# Patient Record
Sex: Male | Born: 1945
Health system: Southern US, Community
[De-identification: ages and names within clinical notes are randomized; demographics above are authoritative.]

## PROBLEM LIST (undated history)

## (undated) DIAGNOSIS — Z974 Presence of external hearing-aid: Secondary | ICD-10-CM

## (undated) DIAGNOSIS — F319 Bipolar disorder, unspecified: Secondary | ICD-10-CM

## (undated) HISTORY — DX: Presence of external hearing-aid: Z97.4

## (undated) HISTORY — PX: CERVICAL FUSION: SHX112

---

## 2009-05-26 ENCOUNTER — Ambulatory Visit: Payer: Self-pay | Admitting: Family Medicine

## 2009-05-26 DIAGNOSIS — N401 Enlarged prostate with lower urinary tract symptoms: Secondary | ICD-10-CM

## 2009-05-26 DIAGNOSIS — F319 Bipolar disorder, unspecified: Secondary | ICD-10-CM | POA: Insufficient documentation

## 2009-05-26 DIAGNOSIS — N529 Male erectile dysfunction, unspecified: Secondary | ICD-10-CM

## 2009-05-26 DIAGNOSIS — R351 Nocturia: Secondary | ICD-10-CM

## 2009-05-26 DIAGNOSIS — N4889 Other specified disorders of penis: Secondary | ICD-10-CM | POA: Insufficient documentation

## 2009-05-27 LAB — CONVERTED CEMR LAB
Albumin: 4.4 g/dL (ref 3.5–5.2)
Alkaline Phosphatase: 62 units/L (ref 39–117)
BUN: 20 mg/dL (ref 6–23)
CO2: 24 meq/L (ref 19–32)
Calcium: 10.1 mg/dL (ref 8.4–10.5)
Chloride: 106 meq/L (ref 96–112)
Glucose, Bld: 61 mg/dL — ABNORMAL LOW (ref 70–99)
Hemoglobin: 13.8 g/dL (ref 13.0–17.0)
PSA: 0.8 ng/mL (ref 0.10–4.00)
Potassium: 4.7 meq/L (ref 3.5–5.3)
RBC: 4.54 M/uL (ref 4.22–5.81)
Sex Hormone Binding: 24 nmol/L (ref 13–71)
Sodium: 139 meq/L (ref 135–145)
TSH: 1.973 microintl units/mL (ref 0.350–4.500)
Testosterone Free: 47.4 pg/mL (ref 47.0–244.0)
Testosterone-% Free: 2.3 % (ref 1.6–2.9)
Total Protein: 6.9 g/dL (ref 6.0–8.3)
WBC: 7.2 10*3/uL (ref 4.0–10.5)

## 2009-06-08 ENCOUNTER — Encounter: Payer: Self-pay | Admitting: Family Medicine

## 2009-06-09 LAB — CONVERTED CEMR LAB
Testosterone-% Free: 2.1 % (ref 1.6–2.9)
Testosterone: 232.46 ng/dL — ABNORMAL LOW (ref 350–890)

## 2009-06-10 ENCOUNTER — Ambulatory Visit: Payer: Self-pay | Admitting: Family Medicine

## 2009-06-24 ENCOUNTER — Ambulatory Visit: Payer: Self-pay | Admitting: Family Medicine

## 2009-06-24 ENCOUNTER — Telehealth: Payer: Self-pay | Admitting: Family Medicine

## 2009-06-24 DIAGNOSIS — R109 Unspecified abdominal pain: Secondary | ICD-10-CM | POA: Insufficient documentation

## 2009-06-24 LAB — CONVERTED CEMR LAB
Bilirubin Urine: NEGATIVE
Blood in Urine, dipstick: NEGATIVE
Glucose, Urine, Semiquant: NEGATIVE
Nitrite: NEGATIVE
WBC Urine, dipstick: NEGATIVE

## 2009-07-13 ENCOUNTER — Ambulatory Visit: Payer: Self-pay | Admitting: Family Medicine

## 2009-07-13 DIAGNOSIS — E291 Testicular hypofunction: Secondary | ICD-10-CM | POA: Insufficient documentation

## 2009-07-21 ENCOUNTER — Ambulatory Visit: Payer: Self-pay | Admitting: Family Medicine

## 2009-07-28 ENCOUNTER — Ambulatory Visit: Payer: Self-pay | Admitting: Family Medicine

## 2009-08-04 ENCOUNTER — Ambulatory Visit: Payer: Self-pay | Admitting: Family Medicine

## 2009-08-11 ENCOUNTER — Ambulatory Visit: Payer: Self-pay | Admitting: Family Medicine

## 2010-10-05 ENCOUNTER — Encounter: Payer: Self-pay | Admitting: Family Medicine

## 2010-10-27 NOTE — Letter (Signed)
Summary: [facility]  [facility]   Imported By: Lanelle Bal 10/20/2010 09:41:16  _____________________________________________________________________  External Attachment:    Type:   Image     Comment:   External Document

## 2010-12-15 ENCOUNTER — Encounter: Payer: Self-pay | Admitting: Family Medicine

## 2010-12-20 ENCOUNTER — Other Ambulatory Visit: Payer: Self-pay | Admitting: *Deleted

## 2010-12-20 ENCOUNTER — Encounter: Payer: Self-pay | Admitting: Family Medicine

## 2010-12-20 ENCOUNTER — Ambulatory Visit (INDEPENDENT_AMBULATORY_CARE_PROVIDER_SITE_OTHER): Payer: BC Managed Care – PPO | Admitting: Family Medicine

## 2010-12-20 DIAGNOSIS — Z13220 Encounter for screening for lipoid disorders: Secondary | ICD-10-CM

## 2010-12-20 DIAGNOSIS — E349 Endocrine disorder, unspecified: Secondary | ICD-10-CM

## 2010-12-20 DIAGNOSIS — E291 Testicular hypofunction: Secondary | ICD-10-CM

## 2010-12-20 MED ORDER — AMBULATORY NON FORMULARY MEDICATION: Status: DC

## 2010-12-20 NOTE — Assessment & Plan Note (Signed)
Discussed optins for testosterone replacement. He would like to try the gel. Will start with Androgel Recheck hormones levels in 6 weeks. He is over due to check a PSA. If no improvement with ED then f/u and complete questionnaire and can consider trial of ED meds.

## 2010-12-20 NOTE — Patient Instructions (Signed)
We will start Androgel. 1 pump to each upper arm daily (2 pumps).  Go online and see if can print a coupon card Lets recheck hormone levels in 6 week after start the med so we can adjust your dose if needed.

## 2010-12-20 NOTE — Progress Notes (Signed)
  Subjective:    Patient ID: Andres Stevens, male    DOB: 06/10/1946, 65 y.o.   MRN: [id number]  HPI He would like to restart testosterone.  Has been on shots previously but stopped sometime last year. He has noticed his libido is down and having some ED.  He also notes he has less muscular strength, dec mood and not sleeping as well. He has only over done injections for replacement therapy.      Review of Systems     Objective:   Physical Exam  Constitutional: He is oriented to person, place, and time. He appears well-developed and well-nourished.  HENT:  Head: Normocephalic and atraumatic.       Wearing bilat hearing aids  Eyes: Pupils are equal, round, and reactive to light.  Neck: Normal range of motion. Neck supple. No thyromegaly present.  Cardiovascular: Normal rate, regular rhythm and normal heart sounds.   Pulmonary/Chest: Effort normal and breath sounds normal.       No carotid bruits.   Lymphadenopathy:    He has no cervical adenopathy.  Neurological: He is alert and oriented to person, place, and time.  Psychiatric: He has a normal mood and affect. His behavior is normal.          Assessment & Plan:

## 2011-01-14 LAB — COMPLETE METABOLIC PANEL WITH GFR
ALT: 15 U/L (ref 0–53)
Albumin: 4.6 g/dL (ref 3.5–5.2)
Alkaline Phosphatase: 60 U/L (ref 39–117)
CO2: 25 mEq/L (ref 19–32)
Potassium: 4.4 mEq/L (ref 3.5–5.3)
Sodium: 140 mEq/L (ref 135–145)
Total Bilirubin: 0.7 mg/dL (ref 0.3–1.2)
Total Protein: 6.5 g/dL (ref 6.0–8.3)

## 2011-01-14 LAB — LIPID PANEL
HDL: 39 mg/dL — ABNORMAL LOW (ref >39)
LDL Cholesterol: 91 mg/dL (ref 0–99)
Total CHOL/HDL Ratio: 4.6 Ratio

## 2011-01-16 ENCOUNTER — Telehealth: Payer: Self-pay | Admitting: Family Medicine

## 2011-01-16 ENCOUNTER — Telehealth: Payer: Self-pay | Admitting: *Deleted

## 2011-01-16 NOTE — Telephone Encounter (Signed)
Pt called back for clarification of lab results.  Pt states he is not taking calcium.  He does take a multivitamin that may contain calcium.  Pt will purchase fish oil and return in one month for follow up triglycerides.  Please advise regarding calcium follow up as pt is not taking extra calcium.

## 2011-01-16 NOTE — Telephone Encounter (Signed)
Call pt: Calcium was borderline elevated. If taking a calcium supplement stop it and lets recheck calcium in one month. Chol looks good, except triglycerides are elevated. If he is not taking  Already taking fish oil, then I recommend taking a fish oil 1000 mg 3-4 times daily. We can recheck triglycerides s in one month as well.

## 2011-01-16 NOTE — Telephone Encounter (Signed)
If not taking extra calcium then that is OK. Will recheck level at f/u. Can continue his MVI for now.

## 2011-01-16 NOTE — Telephone Encounter (Signed)
Left message on pt's vm with dr,. reccomendations

## 2011-01-17 ENCOUNTER — Telehealth: Payer: Self-pay | Admitting: Family Medicine

## 2011-01-17 LAB — PSA: PSA: 0.7 ng/mL (ref <=4.00)

## 2011-01-17 NOTE — Telephone Encounter (Signed)
Call pt: prostate level was nl.

## 2011-01-17 NOTE — Telephone Encounter (Signed)
Advised pt OK to continue mulitvitamin, begin fish oil and we will recheck triglycerides and calcium in one month.  Pt to call a few days ahead for orders.

## 2011-01-18 NOTE — Telephone Encounter (Signed)
Left message on vm

## 2011-02-27 ENCOUNTER — Telehealth: Payer: Self-pay | Admitting: Family Medicine

## 2011-02-27 MED ORDER — AMBULATORY NON FORMULARY MEDICATION: Status: DC

## 2011-02-27 NOTE — Telephone Encounter (Signed)
Prescription sent. Injection testosterone removed from the med list.

## 2011-02-27 NOTE — Telephone Encounter (Signed)
Pt called and wants to restart treatment for low testosterone.  (Androgel pump).  Was seen last 12-20-10.  Said he and provider decided would start the androgel pump, and not do the injections anymore.  Has decided to start the gel.   Plan:  Routed message to Dr. Linford Arnold to write new order for the medication, and also pt instructed to repeat levels along with PSA level in 6 weeks.  Pt voiced understanding. Routed to Dr. Marlyne Beards, LPN [facility]

## 2011-05-03 ENCOUNTER — Telehealth: Payer: Self-pay | Admitting: *Deleted

## 2011-05-03 DIAGNOSIS — E291 Testicular hypofunction: Secondary | ICD-10-CM

## 2011-05-03 DIAGNOSIS — N400 Enlarged prostate without lower urinary tract symptoms: Secondary | ICD-10-CM

## 2011-05-03 NOTE — Telephone Encounter (Signed)
Pt called for lab order

## 2011-05-05 ENCOUNTER — Telehealth: Payer: Self-pay | Admitting: Family Medicine

## 2011-05-05 NOTE — Telephone Encounter (Signed)
Left message on vm

## 2011-05-05 NOTE — Telephone Encounter (Signed)
Call pt: PSA is normal.

## 2011-05-08 ENCOUNTER — Ambulatory Visit (INDEPENDENT_AMBULATORY_CARE_PROVIDER_SITE_OTHER): Payer: BC Managed Care – PPO | Admitting: Family Medicine

## 2011-05-08 ENCOUNTER — Encounter: Payer: Self-pay | Admitting: Family Medicine

## 2011-05-08 ENCOUNTER — Other Ambulatory Visit: Payer: Self-pay | Admitting: Family Medicine

## 2011-05-08 DIAGNOSIS — E291 Testicular hypofunction: Secondary | ICD-10-CM

## 2011-05-08 DIAGNOSIS — N529 Male erectile dysfunction, unspecified: Secondary | ICD-10-CM

## 2011-05-08 NOTE — Progress Notes (Signed)
  Subjective:    Patient ID: Andres Stevens, male    DOB: [birth year], 65 y.o.   MRN: [id number]  HPI On the 2nd bottle of androgel and did miss 3-4 days.  Doesn't feel that much different. He has not noticed any changes in his energy or mood,or muscle strength. He has not noticed any improvement with his erectile dysfunction. In fact he has been more fatigued over the last 3 weeks. He wonders if this could be from the AndroGel. Otherwise he's tolerating the medication well and has not had any side effects. He did do the lab last week to have his blood work checked. His PSA was normal, but unfortunately we have not received the results of the testosterone level.   Review of Systems     Objective:   Physical Exam  Constitutional: He appears well-developed and well-nourished.  Psychiatric: He has a normal mood and affect. His behavior is normal.          Assessment & Plan:  Hypergonadism-I really need to get his blood level to see if he may be subtherapeutic. If he is we will adjust his dose and then recheck his level in 2 months. If he is at a therapeutic level and he is not noticing any symptom improvement and I recommend discontinuing medication. I also explained to him that even if he does start to notice improvement other areas of his life such as energy and muscle strength he may not notice improvement in his erectile dysfunction as oftentimes the cause for that is multifactorial.  Erectile dysfunction-with a long discussion about different options including how different medications work. We also discussed daily dosing medication versus as needed dosing. I did give him samples of Levitra 10 mg, Viagra 50 mg, Cialis 20 mg. He can try different medications and let me know if he feels they're helping him walk well.  20 minutes spent face-to-face discussing options for her testosterone and erectile dysfunction.

## 2011-05-08 NOTE — Patient Instructions (Signed)
We will call you about your testosterone level.

## 2011-05-09 ENCOUNTER — Other Ambulatory Visit: Payer: Self-pay | Admitting: Family Medicine

## 2011-05-09 LAB — TESTOSTERONE, FREE, TOTAL, SHBG: Testosterone, Free: 34.8 pg/mL — ABNORMAL LOW (ref 47.0–244.0)

## 2011-05-09 NOTE — Telephone Encounter (Signed)
Call pt: Increase androgel to 2 pumps on each upper arm once a day in the AM  I can send over new rx. Then recheck levels in 8 weeks.

## 2011-05-10 MED ORDER — AMBULATORY NON FORMULARY MEDICATION: Status: DC

## 2011-05-10 NOTE — Telephone Encounter (Signed)
Left message on pt.'s vm.

## 2011-06-19 ENCOUNTER — Telehealth: Payer: Self-pay | Admitting: Family Medicine

## 2011-06-19 ENCOUNTER — Other Ambulatory Visit: Payer: Self-pay | Admitting: Family Medicine

## 2011-06-19 MED ORDER — TADALAFIL 20 MG PO TABS: 20.0000 mg | ORAL_TABLET | Freq: Every day | ORAL | Status: DC | PRN

## 2011-06-19 NOTE — Telephone Encounter (Signed)
Pt called for 30 day free trial of cialis 20 mg.  However the 30 day free trial is for 2.5 or 5 mg and those doses do not work immediately.  LMOM for the pt to return the call to the triage nurse to let her know what he would like to do. Plan:  Pending patient call back. Jarvis Newcomer, LPN [facility]'

## 2011-06-19 NOTE — Telephone Encounter (Signed)
Pt calling for Rx of his cialis for a 30 day free trial if hard script taken to  Pharm with his coupon.  Uses cialis 20 mg. Plan:  Printed script for 30 day worth for cialis 20 mg.  Jarvis Newcomer, LPN [facility]

## 2011-06-20 ENCOUNTER — Other Ambulatory Visit: Payer: Self-pay | Admitting: Family Medicine

## 2011-06-20 MED ORDER — TADALAFIL 5 MG PO TABS: 5.0000 mg | ORAL_TABLET | Freq: Every day | ORAL | Status: DC | PRN

## 2011-06-20 NOTE — Telephone Encounter (Signed)
This encounter has been taken care of .  Rx for  cialis 5 mg was sent to pt pharm.  Pt aware. Jarvis Newcomer, LPN [facility];

## 2011-06-20 NOTE — Telephone Encounter (Signed)
Completed.

## 2011-06-20 NOTE — Telephone Encounter (Signed)
Pt has a 30 day free trial coupon for cialis .  Wants to do the 5 mg for daily use.

## 2011-09-12 ENCOUNTER — Telehealth: Payer: Self-pay | Admitting: *Deleted

## 2011-09-12 MED ORDER — TADALAFIL 5 MG PO TABS: 5.0000 mg | ORAL_TABLET | Freq: Every day | ORAL | Status: DC | PRN

## 2011-11-14 ENCOUNTER — Encounter: Payer: Self-pay | Admitting: Physician Assistant

## 2011-11-14 ENCOUNTER — Ambulatory Visit (INDEPENDENT_AMBULATORY_CARE_PROVIDER_SITE_OTHER): Payer: BC Managed Care – PPO | Admitting: Physician Assistant

## 2011-11-14 VITALS — BP 116/62 | HR 64 | Wt 170.0 lb

## 2011-11-14 DIAGNOSIS — Z23 Encounter for immunization: Secondary | ICD-10-CM

## 2011-11-14 DIAGNOSIS — N401 Enlarged prostate with lower urinary tract symptoms: Secondary | ICD-10-CM

## 2011-11-14 DIAGNOSIS — Z0000 Encounter for general adult medical examination without abnormal findings: Secondary | ICD-10-CM

## 2011-11-14 LAB — POCT URINALYSIS DIPSTICK
Bilirubin, UA: NEGATIVE
Blood, UA: NEGATIVE
Leukocytes, UA: NEGATIVE
Nitrite, UA: NEGATIVE
Urobilinogen, UA: 0.2
pH, UA: 6.5

## 2011-11-14 MED ORDER — TERAZOSIN HCL 1 MG PO CAPS: 1.0000 mg | ORAL_CAPSULE | Freq: Every day | ORAL | Status: DC

## 2011-11-14 NOTE — Patient Instructions (Addendum)
Will send letter to psychiatrist to encourage increase of prozac. Will refer to neurology. Can consider melatonin to help with sleep. Will send to pharmacy the terazosin for enlarge prostate.   Gave Zostavax in office.

## 2011-11-14 NOTE — Progress Notes (Signed)
  Subjective:    Patient ID: Andres Stevens, male    DOB: [birth year], 66 y.o.   MRN: [id number]  HPI Patient presents to the clinic for a CPE.   Patient and family has noticed some short term memory loss. He has been evaluated at [facility] 2 times. They states that he has tested positve for memory loss and needs to see neurologist. He has had a cousin that had Lewy body demitia and recently passed away. His family has had memory loss but noon has been diagnosed with alzhiemer's . Patient today states that he struggle remembers names and things that people tell him to do.   His depression had been controlled for many months but recently patient has felt really down. He recently lost his job because he was too slow at getting work done. He has had more problems staying asleep lately because he is thinking about so much. He denies any thoughts of suicide. Patient wants to have some of his medications changed. PA Andres Stevens is seeing him for pysch.  Last colonoscopy was 5 years ago.   He has over the last 6 months noticed problems with urinating much more frequently and weak stream. Will have paitent fill out quesionarrie.     Review of Systems    Objective:   Physical Exam  Constitutional: He is oriented to person, place, and time. He appears well-developed and well-nourished.  HENT:  Head: Normocephalic and atraumatic.  Right Ear: External ear normal.  Left Ear: External ear normal.  Nose: Nose normal.  Mouth/Throat: Oropharynx is clear and moist.       TM's normal bilaterally. Hearing loss noted in both ears. Patient is not wearing his hearing aids today.  Eyes: Conjunctivae are normal. Pupils are equal, round, and reactive to light.  Neck: Normal range of motion. Neck supple.  Cardiovascular: Normal rate, regular rhythm, normal heart sounds and intact distal pulses.   Pulmonary/Chest: Effort normal and breath sounds normal.  Abdominal: Soft. Bowel sounds are  normal.  Genitourinary: Rectum normal. Guaiac negative stool.       Smooth, enlarged prostate.  Musculoskeletal: Normal range of motion.       Strength 5/5.  Neurological: He is alert and oriented to person, place, and time. He has normal reflexes.  Skin: Skin is warm and dry.  Psychiatric: He has a normal mood and affect. His behavior is normal.          Assessment & Plan:  CPE- Zostavax given. Lipid panel and CMP ordered. Will call with results. Hemmocult negative. Continue healthy diet and regular exercise.  BPH- AUA-23. Started on Terazosin tapering up. Order PSA.   Memory Loss-Was able to remember 3 obj at begnining of interview and at the end.Patient has been tested twice in [place] at a memory clinic. They suggested a referal to Neurology. Office will call with appointment. Patient was told to bring in results of memory testing from clinic in [place].   Depression-PHQ-9: 14.We discussed his prozac being increased and patient did agree. Andres Stevens [facility] manages his medications. I will fax a letter suggesting an increase.  Will send to: [facility] [address] [place] [address] [place] [address] Melatonin OTC was suggested to help with sleep.

## 2011-11-16 LAB — LIPID PANEL
Cholesterol: 198 mg/dL (ref 0–200)
Total CHOL/HDL Ratio: 5 Ratio
Triglycerides: 248 mg/dL — ABNORMAL HIGH (ref <150)
VLDL: 50 mg/dL — ABNORMAL HIGH (ref 0–40)

## 2011-11-16 LAB — COMPREHENSIVE METABOLIC PANEL
AST: 18 U/L (ref 0–37)
Alkaline Phosphatase: 68 U/L (ref 39–117)
BUN: 22 mg/dL (ref 6–23)
Calcium: 10.3 mg/dL (ref 8.4–10.5)
Chloride: 106 mEq/L (ref 96–112)
Creat: 1.18 mg/dL (ref 0.50–1.35)
Glucose, Bld: 103 mg/dL — ABNORMAL HIGH (ref 70–99)

## 2011-11-16 LAB — PSA, TOTAL AND FREE
PSA, Free Pct: 31 % (ref >25)
PSA, Free: 0.19 ng/mL

## 2011-11-20 ENCOUNTER — Telehealth: Payer: Self-pay | Admitting: *Deleted

## 2011-11-20 NOTE — Telephone Encounter (Signed)
Pt states that he will try medication for his cholesterol. He also would like for you to know that he is waiting for them to type up his memory test result and that he should have them to you by mid-week.

## 2011-11-20 NOTE — Telephone Encounter (Signed)
Acknowledge I have read information.

## 2011-11-23 ENCOUNTER — Telehealth: Payer: Self-pay | Admitting: *Deleted

## 2011-11-23 MED ORDER — FENOFIBRATE 145 MG PO TABS: 145.0000 mg | ORAL_TABLET | Freq: Every day | ORAL | Status: DC

## 2011-11-23 NOTE — Telephone Encounter (Signed)
rx sent. Recheck labs in 8 weeks.

## 2011-11-23 NOTE — Telephone Encounter (Signed)
LM on VM.

## 2011-11-23 NOTE — Telephone Encounter (Signed)
I sent message to Andres Stevens stating pt is willing to try cholesterol med but it was never sent to pharmacy. Please advise what medication needs to be sent for his cholesterol.

## 2011-12-25 ENCOUNTER — Other Ambulatory Visit: Payer: Self-pay | Admitting: Physician Assistant

## 2012-02-13 ENCOUNTER — Encounter: Payer: Self-pay | Admitting: Family Medicine

## 2012-02-13 ENCOUNTER — Ambulatory Visit (INDEPENDENT_AMBULATORY_CARE_PROVIDER_SITE_OTHER): Payer: BC Managed Care – PPO | Admitting: Family Medicine

## 2012-02-13 VITALS — BP 114/68 | HR 65 | Ht 68.5 in | Wt 169.0 lb

## 2012-02-13 DIAGNOSIS — M533 Sacrococcygeal disorders, not elsewhere classified: Secondary | ICD-10-CM

## 2012-02-13 DIAGNOSIS — M549 Dorsalgia, unspecified: Secondary | ICD-10-CM

## 2012-02-13 MED ORDER — ORPHENADRINE CITRATE ER 100 MG PO TB12: 100.0000 mg | ORAL_TABLET | Freq: Two times a day (BID) | ORAL | Status: AC

## 2012-02-13 MED ORDER — KETOROLAC TROMETHAMINE 60 MG/2ML IM SOLN
60.0000 mg | Freq: Once | INTRAMUSCULAR | Status: AC
  Administered 2012-02-13: 60 mg via INTRAMUSCULAR

## 2012-02-13 MED ORDER — HYDROCODONE-ACETAMINOPHEN 5-325 MG PO TABS: 1.0000 | ORAL_TABLET | Freq: Four times a day (QID) | ORAL | Status: AC | PRN

## 2012-02-13 MED ORDER — MELOXICAM 15 MG PO TABS: 15.0000 mg | ORAL_TABLET | Freq: Every day | ORAL | Status: AC

## 2012-02-13 MED ORDER — PREDNISONE (PAK) 10 MG PO TABS: 10.0000 mg | ORAL_TABLET | Freq: Every day | ORAL | Status: AC

## 2012-02-13 NOTE — Patient Instructions (Signed)
Back Pain, Adult Low back pain is very common. About 1 in 5 people have back pain.The cause of low back pain is rarely dangerous. The pain often gets better over time.About half of people with a sudden onset of back pain feel better in just 2 weeks. About 8 in 10 people feel better by 6 weeks.  CAUSES Some common causes of back pain include:  Strain of the muscles or ligaments supporting the spine.   Wear and tear (degeneration) of the spinal discs.   Arthritis.   Direct injury to the back.  DIAGNOSIS Most of the time, the direct cause of low back pain is not known.However, back pain can be treated effectively even when the exact cause of the pain is unknown.Answering your caregiver's questions about your overall health and symptoms is one of the most accurate ways to make sure the cause of your pain is not dangerous. If your caregiver needs more information, he or she may order lab work or imaging tests (X-rays or MRIs).However, even if imaging tests show changes in your back, this usually does not require surgery. HOME CARE INSTRUCTIONS For many people, back pain returns.Since low back pain is rarely dangerous, it is often a condition that people can learn to manageon their own.   Remain active. It is stressful on the back to sit or stand in one place. Do not sit, drive, or stand in one place for more than 30 minutes at a time. Take short walks on level surfaces as soon as pain allows.Try to increase the length of time you walk each day.   Do not stay in bed.Resting more than 1 or 2 days can delay your recovery.   Do not avoid exercise or work.Your body is made to move.It is not dangerous to be active, even though your back may hurt.Your back will likely heal faster if you return to being active before your pain is gone.   Pay attention to your body when you bend and lift. Many people have less discomfortwhen lifting if they bend their knees, keep the load close to their  bodies,and avoid twisting. Often, the most comfortable positions are those that put less stress on your recovering back.   Find a comfortable position to sleep. Use a firm mattress and lie on your side with your knees slightly bent. If you lie on your back, put a pillow under your knees.   Only take over-the-counter or prescription medicines as directed by your caregiver. Over-the-counter medicines to reduce pain and inflammation are often the most helpful.Your caregiver may prescribe muscle relaxant drugs.These medicines help dull your pain so you can more quickly return to your normal activities and healthy exercise.   Put ice on the injured area.   Put ice in a plastic bag.   Place a towel between your skin and the bag.   Leave the ice on for 15 to 20 minutes, 3 to 4 times a day for the first 2 to 3 days. After that, ice and heat may be alternated to reduce pain and spasms.   Ask your caregiver about trying back exercises and gentle massage. This may be of some benefit.   Avoid feeling anxious or stressed.Stress increases muscle tension and can worsen back pain.It is important to recognize when you are anxious or stressed and learn ways to manage it.Exercise is a great option.  SEEK MEDICAL CARE IF:  You have pain that is not relieved with rest or medicine.   You have   pain that does not improve in 1 week.   You have new symptoms.   You are generally not feeling well.  SEEK IMMEDIATE MEDICAL CARE IF:   You have pain that radiates from your back into your legs.   You develop new bowel or bladder control problems.   You have unusual weakness or numbness in your arms or legs.   You develop nausea or vomiting.   You develop abdominal pain.   You feel faint.  Document Released: 09/11/2005 Document Revised: 08/31/2011 Document Reviewed: 01/30/2011 [employer] Patient Information 2012 [employer]. 

## 2012-02-13 NOTE — Progress Notes (Signed)
  Subjective:    Patient ID: Andres Stevens, male    DOB: June 28, 1946, 66 y.o.   MRN: [id number]  Back Pain This is a chronic (SINCE 92 W/ACUTE EXACERBATIONS) problem. The current episode started more than 1 month ago (DOING YARD WORK AND THEN 3 WEEKS AGO STARTED DOING YARD WORK GOT WORSE). The problem occurs daily. The problem has been gradually worsening since onset. The pain is present in the sacro-iliac and lumbar spine. The quality of the pain is described as stabbing. The pain does not radiate. The pain is at a severity of 9/10. The pain is severe. The pain is worse during the night. The symptoms are aggravated by position, bending and twisting. Stiffness is present at night. Pertinent negatives include no abdominal pain, bladder incontinence, bowel incontinence, chest pain, dysuria, fever, headaches, leg pain, numbness, paresis, paresthesias, pelvic pain, perianal numbness, tingling, weakness or weight loss. Risk factors: NONE. He has tried NSAIDs for the symptoms. The treatment provided mild relief.      Review of Systems  Constitutional: Negative for fever and weight loss.  Cardiovascular: Negative for chest pain.  Gastrointestinal: Negative for abdominal pain and bowel incontinence.  Genitourinary: Negative for bladder incontinence, dysuria and pelvic pain.  Musculoskeletal: Positive for back pain.  Neurological: Negative for tingling, weakness, numbness, headaches and paresthesias.  All other systems reviewed and are negative.      BP 114/68  Pulse 65  Ht 5' 8.5" (1.74 m)  Wt 169 lb (76.658 kg)  BMI 25.32 kg/m2  SpO2 99% Objective:   Physical Exam  Constitutional: He is oriented to person, place, and time. He appears well-developed and well-nourished. No distress.  Musculoskeletal: Normal range of motion. He exhibits no edema and no tenderness.       TENDERNESS IN THE ILIAC SACRAL JOINT AREA  Neurological: He is oriented to person, place, and time. He displays normal  reflexes. He exhibits normal muscle tone. Coordination normal.  Skin: Skin is warm and dry.  Psychiatric: He has a normal mood and affect.      Assessment & Plan:  #! Back pain /iliac sacral irritation. First Toradol here in the office and then:norflex 100 mg 2 x a day,   mobic 15 mg daily, and hydrocodone mainly at night for pain and patient will be placed on steroid taper dose.  Return in 1 ,month if needed.for folopw up

## 2012-03-19 ENCOUNTER — Encounter: Payer: Self-pay | Admitting: Family Medicine

## 2012-03-19 ENCOUNTER — Ambulatory Visit (INDEPENDENT_AMBULATORY_CARE_PROVIDER_SITE_OTHER): Payer: BC Managed Care – PPO | Admitting: Family Medicine

## 2012-03-19 VITALS — BP 105/64 | HR 62 | Ht 68.5 in | Wt 166.0 lb

## 2012-03-19 DIAGNOSIS — Z9181 History of falling: Secondary | ICD-10-CM

## 2012-03-19 DIAGNOSIS — R5381 Other malaise: Secondary | ICD-10-CM

## 2012-03-19 DIAGNOSIS — R5383 Other fatigue: Secondary | ICD-10-CM

## 2012-03-19 DIAGNOSIS — R7309 Other abnormal glucose: Secondary | ICD-10-CM

## 2012-03-19 DIAGNOSIS — F329 Major depressive disorder, single episode, unspecified: Secondary | ICD-10-CM

## 2012-03-19 DIAGNOSIS — F319 Bipolar disorder, unspecified: Secondary | ICD-10-CM

## 2012-03-19 DIAGNOSIS — F3289 Other specified depressive episodes: Secondary | ICD-10-CM

## 2012-03-19 DIAGNOSIS — R739 Hyperglycemia, unspecified: Secondary | ICD-10-CM

## 2012-03-19 NOTE — Progress Notes (Signed)
Subjective:    Patient ID: Andres Stevens, male    DOB: 05/11/1946, 66 y.o.   MRN: [id number]  HPI  Patient is here because of increased fatigue and concern that he be getting depressed. Since he has been on the prednisone taper for his back he has noticed some fatigue and not functioning at what he feels is his full capacity. Patient unfortunately is somewhat vague about when to start it and how long this has been going on. He does report last month at his pre-reunion gathering participating in more alcohol consumption than usual and not feeling well for a couple days including the actual day of his reunion. He also has informed me that she definitely has diagnosis of bipolar disease and that his psychiatrist has recently changed him from Prozac to Lexapro. Interesting he went from 40 of Prozac to only 10 mg of Lexapro.  He is still taking the Mobic for his back pain and the Norflex twice a day for muscle spasm. He is taking his TriCor but states that he has stopped taking his Cialis and he is still taking his Hytrin which is a medication he has used for a long time.  Review of Systems  Constitutional: Positive for fatigue.  Musculoskeletal: Positive for gait problem.  Psychiatric/Behavioral: Positive for decreased concentration.      BP 105/64  Pulse 62  Ht 5' 8.5" (1.74 m)  Wt 166 lb (75.297 kg)  BMI 24.87 kg/m2  SpO2 98% Objective:   Physical Exam  Constitutional: He is oriented to person, place, and time. He appears well-developed and well-nourished.  HENT:  Head: Normocephalic.  Cardiovascular: Normal rate.   Pulmonary/Chest: Effort normal and breath sounds normal.  Neurological: He is alert and oriented to person, place, and time. He has normal reflexes.  Skin: Skin is warm.  Psychiatric: He has a normal mood and affect.   Fall assessment was a 1 age only Depression score was only a 6 on the PQ9.    Results for orders placed in visit on 11/14/11  LIPID PANEL   Component Value Range   Cholesterol 198  0 - 200 mg/dL   Triglycerides 409 (*) <150 mg/dL   HDL 40  >81 mg/dL   Total CHOL/HDL Ratio 5.0     VLDL 50 (*) 0 - 40 mg/dL   LDL Cholesterol 191 (*) 0 - 99 mg/dL  COMPREHENSIVE METABOLIC PANEL      Component Value Range   Sodium 140  135 - 145 mEq/L   Potassium 4.5  3.5 - 5.3 mEq/L   Chloride 106  96 - 112 mEq/L   CO2 28  19 - 32 mEq/L   Glucose, Bld 103 (*) 70 - 99 mg/dL   BUN 22  6 - 23 mg/dL   Creat 4.78  2.95 - 6.21 mg/dL   Total Bilirubin 0.7  0.3 - 1.2 mg/dL   Alkaline Phosphatase 68  39 - 117 U/L   AST 18  0 - 37 U/L   ALT 18  0 - 53 U/L   Total Protein 6.5  6.0 - 8.3 g/dL   Albumin 4.4  3.5 - 5.2 g/dL   Calcium 30.8  8.4 - 65.7 mg/dL  PSA, TOTAL AND FREE      Component Value Range   PSA 0.62  <=4.00 ng/mL   PSA, Free 0.19     PSA, Free Pct 31  >25 %  POCT URINALYSIS DIPSTICK      Component Value Range  Color, UA yellow     Clarity, UA clear     Glucose, UA neg     Bilirubin, UA neg     Ketones, UA neg     Spec Grav, UA 1.020     Blood, UA neg     pH, UA 6.5     Protein, UA neg     Urobilinogen, UA 0.2     Nitrite, UA neg     Leukocytes, UA Negative     Assessment & Plan:  #1 fatigue/increased depression? Since he has a psychiatrist I do not want to personally change his medication. I did suggest to him that he may want to check with his psychiatrist if he or his wife feels that his depression has not improved. He may need an increase of Lexapro to 20 mg. Reassure him that the short course of prednisone should not cause persistent fatigue and tiredness. Especially this late date. #2 back pain. I suggest that he only take the Norflex and Mobic on a when necessary basis and that he only takes the Norflex and Mobic at night just in case the Norflex causes some sedation. #3 elevated blood sugar. Will also check an A1c since his blood sugar was elevated in February as well as a TSH ,CBC and a testosterone level since there  has been a history of sexual dysfunction as well. #4 since there was a question of gait disturbance and depression both the fall and depression assessments were done with no significant findings.

## 2012-03-19 NOTE — Patient Instructions (Signed)

## 2012-03-20 LAB — TESTOSTERONE, FREE, TOTAL, SHBG: Testosterone: 133.07 ng/dL — ABNORMAL LOW (ref 300–890)

## 2012-03-20 LAB — TSH: TSH: 1.903 u[IU]/mL (ref 0.350–4.500)

## 2012-03-20 LAB — CBC WITH DIFFERENTIAL/PLATELET
Basophils Absolute: 0 10*3/uL (ref 0.0–0.1)
Eosinophils Absolute: 0.1 10*3/uL (ref 0.0–0.7)
Lymphs Abs: 0.5 10*3/uL — ABNORMAL LOW (ref 0.7–4.0)
MCH: 30 pg (ref 26.0–34.0)
Neutrophils Relative %: 82 % — ABNORMAL HIGH (ref 43–77)
Platelets: 132 10*3/uL — ABNORMAL LOW (ref 150–400)
RBC: 4.23 MIL/uL (ref 4.22–5.81)
WBC: 4.4 10*3/uL (ref 4.0–10.5)

## 2012-03-26 ENCOUNTER — Encounter: Payer: Self-pay | Admitting: Family Medicine

## 2012-04-04 ENCOUNTER — Ambulatory Visit (INDEPENDENT_AMBULATORY_CARE_PROVIDER_SITE_OTHER): Payer: BC Managed Care – PPO | Admitting: Family Medicine

## 2012-04-04 ENCOUNTER — Encounter: Payer: Self-pay | Admitting: Family Medicine

## 2012-04-04 VITALS — BP 122/73 | HR 76 | Ht 68.5 in | Wt 165.0 lb

## 2012-04-04 DIAGNOSIS — D649 Anemia, unspecified: Secondary | ICD-10-CM

## 2012-04-04 DIAGNOSIS — R5383 Other fatigue: Secondary | ICD-10-CM

## 2012-04-04 DIAGNOSIS — R5381 Other malaise: Secondary | ICD-10-CM

## 2012-04-04 DIAGNOSIS — E291 Testicular hypofunction: Secondary | ICD-10-CM

## 2012-04-04 NOTE — Progress Notes (Signed)
  Subjective:    Patient ID: Andres Stevens, male    DOB: [birth year], 66 y.o.   MRN: [id number]  HPI Here to f/u abnormal labwork.  Was on androgel for a peroid of time. No hx of prostate Ca but no enlargement.  He did see his psychatrist. They didn't adjust his meds.  They kept him on Lexapro 10 mg. On his blood work his testosterone was low. We reviewed this and discuss treatment options. We also discussed that his hemoglobin had drifted slowly under 13. It is very mild but does need further workup. He does complain of fatigue today.   Review of Systems     Objective:   Physical Exam  Constitutional: He is oriented to person, place, and time. He appears well-developed and well-nourished.  HENT:  Head: Normocephalic and atraumatic.  Cardiovascular: Normal rate, regular rhythm and normal heart sounds.   Pulmonary/Chest: Effort normal and breath sounds normal.  Neurological: He is alert and oriented to person, place, and time.  Skin: Skin is warm and dry.  Psychiatric: He has a normal mood and affect. His behavior is normal.          Assessment & Plan:  Hypogonadism - discussed test replacement options. I showed him the demonstration products for Fortesta, AndroGel, and AXIRON. He can certainly think about his options if he would like to move forward we certainly can. Explained to him that we restarted testosterone therapy we will need to recheck his labs including his prostate liver enzymes in one month.  Anemia - Will check ferritin, vit B12 and folate. We'll call with lab results. He denies any blood in the urine or stool.  His colonoscopy was in 2007 and is technically up-to-date.  Fatigue- says feels better since stopped the norflex. He does have some mild anemia but I'm not sure that is significant enough to contribute to his fatigue.  Chronic back pain-stable.

## 2012-04-04 NOTE — Patient Instructions (Addendum)
Think about the testosterone replacment.  We will call you with your lab results. If you don't here from [place] in about a week then please give [place] a call at [phone].

## 2012-04-05 LAB — FERRITIN: Ferritin: 197 ng/mL (ref 22–322)

## 2012-04-05 LAB — FOLATE: Folate: >20 ng/mL

## 2012-05-13 ENCOUNTER — Encounter: Payer: Self-pay | Admitting: Physician Assistant

## 2012-05-13 ENCOUNTER — Ambulatory Visit (INDEPENDENT_AMBULATORY_CARE_PROVIDER_SITE_OTHER): Payer: BC Managed Care – PPO | Admitting: Physician Assistant

## 2012-05-13 VITALS — BP 121/66 | HR 67 | Ht 68.5 in | Wt 167.0 lb

## 2012-05-13 DIAGNOSIS — E291 Testicular hypofunction: Secondary | ICD-10-CM

## 2012-05-13 DIAGNOSIS — F0150 Vascular dementia without behavioral disturbance: Secondary | ICD-10-CM | POA: Insufficient documentation

## 2012-05-13 DIAGNOSIS — F329 Major depressive disorder, single episode, unspecified: Secondary | ICD-10-CM

## 2012-05-13 DIAGNOSIS — Z79899 Other long term (current) drug therapy: Secondary | ICD-10-CM

## 2012-05-13 DIAGNOSIS — F03B4 Unspecified dementia, moderate, with anxiety: Secondary | ICD-10-CM | POA: Insufficient documentation

## 2012-05-13 DIAGNOSIS — E781 Pure hyperglyceridemia: Secondary | ICD-10-CM

## 2012-05-13 DIAGNOSIS — R413 Other amnesia: Secondary | ICD-10-CM

## 2012-05-13 DIAGNOSIS — N529 Male erectile dysfunction, unspecified: Secondary | ICD-10-CM

## 2012-05-13 MED ORDER — VARDENAFIL HCL 10 MG PO TABS: 10.0000 mg | ORAL_TABLET | Freq: Every day | ORAL | Status: DC | PRN

## 2012-05-13 NOTE — Patient Instructions (Addendum)
Stop taking Terazosin. If urinary symptoms come back then we will need to restart but taking it daily. Will follow up in 3 months and retake AUA test.  Stop Mobic(meloxicam) for back pain and see if pain comes back.  Consider taking when going to be active lack mowing.   After fasting for 8 hours come and get labs drawn to recheck triglycerides. Remember to take Tricor daily.   Will call with labs and schedule testosterone shots accordingly.   Follow up in 3 months to go over how you feel after testosterone shots and stoping Terazosin.   Gave Levitra to take as needed for sexual intercourse.

## 2012-05-13 NOTE — Progress Notes (Signed)
Subjective:    Patient ID: Andres Stevens, male    DOB: 1946-09-18, 66 y.o.   MRN: [id number]  HPI Patient presents to the clinic for 6 followup. He reports that he does feel some better with his depression. His film Lexapro 10 mg along with lithium 150 mg 3 times a day. PA-C Andres Stevens manages his psych meds. He has started exercising some and when he does he does so feel better. His last visit with Dr. Glade Stevens did discuss testosterone replacement since his testosterone was low. At that time he did not want to start with testosterone replacement because he felt like he had tried AndroGel the past and it did nothing for him. Today he thinks he would like to try testosterone shots.  Patient really wants to go on Cialis as needed for erectile dysfunction. He reports he has tried and failed Viagra but has not tried Orthoptist. He denies any relationship problems with his wife.  Hypertriglyceridemia-he often forgets to take his TriCor. She has tried to make dietary changes and exercise more frequently.  Despite AUA symptoms being 23 at last visit with me in February patient for reports that they have improved. He no longer wakes up multiple times at night. He does still feel like his strength is weak. He has been taken terazosin but it has not been regularly.  Patient is still having problems with memory. He has been evaluated by neurologist and MRI was done and was normal. His B12 levels have been checked and were normal. He does have a family history of Alzheimer's.  He also brings in a all of his medications that he is taking along with their side effects. He is concerned that some of his medications may be interacting with each other or causing side effects.    Surgical history and family history remains unchanged. Patient still continues to smoke everyday.  Review of Systems  Constitutional: Positive for fatigue. Negative for activity change and appetite change.  Cardiovascular: Negative for  chest pain and palpitations.  Genitourinary: Positive for difficulty urinating. Negative for dysuria and urgency.  Musculoskeletal: Negative for myalgias and back pain.  Psychiatric/Behavioral: Positive for dysphoric mood. The patient is not nervous/anxious.        Objective:   Physical Exam  Constitutional: He is oriented to person, place, and time. He appears well-developed and well-nourished.  HENT:  Head: Normocephalic and atraumatic.  Cardiovascular: Normal rate, regular rhythm and normal heart sounds.   Pulmonary/Chest: Effort normal and breath sounds normal. He has no wheezes.  Neurological: He is alert and oriented to person, place, and time.       She did have problems with remembering certain things. He has been evaluated by neurologist and MRI was done and was normal.  Skin: Skin is warm and dry. No rash noted.  Psychiatric: He has a normal mood and affect. His behavior is normal.          Assessment & Plan:  Male hypogonadism/depression-I will not manage depression medications due to Andres Stevens Stevens managing them currently. will check liver enzymes, PSA, creatinine and call patient with results. If all normal we will start testosterone shots every 2 weeks. We will recheck testosterone level one week after first injection. I think this might be a good option because he's made to come to the clinic. Patient does not like to take daily pills or do any kind a daily regimen. He also reports that he forgets frequently to take medication. Will recheck with office  visit in 3 months. I educated him that I think this will also help with his fatigue and maybe even his mood.  Hypertriglyceridemia-will get lipid panel today. Discussed with patient and the importance of taking TriCor daily. We also talked about diet and regular exercise. Patient is aware and reports he is trying to implement these things into his lifestyle.  Hypertrophic prostate without urinary instruction-since patient has  not been taking Terazosin regularly and he reports that symptoms have improved patient was told to stop taking all together. If urinary symptoms come back we will consider starting that it needs to be taken daily. Followup with this in 3 months.  Erectile dysfunction-gave Levitra to take one hour before sexual intercourse as needed. Patient has tried and failed Viagra. Followup in 3 months.  Memory loss-labs and MRI were normal. Has been seen by a neurologist. I suggests writing things down but he wants to remember and take it daily multivitamin. Followup if memory loss worsening. Discuss with patient that some of the memory problems could be related to medication in which then we have a discussion of risk versus benefit. I think we should work on getting testosterone up and maybe memory will improve somewhat also.  Medication management-we spent 15 minutes going over her medications and their side effects. He reports that his back pain has resolved but is still on Mobic every day. We decided to stop Mobic and only use as needed or when he is going to be active such as mowing the lawn or on his feet all day. He is concerned about the interaction of lithium and Lexapro. I encouraged him to talk to his psychiatrist about this interaction and make sure that regular lithium toxicity his are being drawn. I did encourage to make sure he is adequately hydrated being on lithium.  Marland Kitchen

## 2012-05-13 NOTE — Addendum Note (Signed)
Addended by: Jomarie Longs on: 05/13/2012 12:08 PM   Modules accepted: Level of Service

## 2012-05-14 LAB — LIPID PANEL
Cholesterol: 195 mg/dL (ref 0–200)
LDL Cholesterol: 110 mg/dL — ABNORMAL HIGH (ref 0–99)
Total CHOL/HDL Ratio: 4.4 Ratio
VLDL: 41 mg/dL — ABNORMAL HIGH (ref 0–40)

## 2012-05-14 LAB — COMPREHENSIVE METABOLIC PANEL
ALT: 18 U/L (ref 0–53)
AST: 18 U/L (ref 0–37)
Albumin: 4.5 g/dL (ref 3.5–5.2)
Alkaline Phosphatase: 50 U/L (ref 39–117)
BUN: 26 mg/dL — ABNORMAL HIGH (ref 6–23)
Creat: 1.24 mg/dL (ref 0.50–1.35)
Potassium: 4.6 mEq/L (ref 3.5–5.3)

## 2012-05-15 ENCOUNTER — Other Ambulatory Visit: Payer: Self-pay | Admitting: Physician Assistant

## 2012-05-15 MED ORDER — ATORVASTATIN CALCIUM 20 MG PO TABS: 20.0000 mg | ORAL_TABLET | Freq: Every day | ORAL | Status: DC

## 2012-05-16 ENCOUNTER — Telehealth: Payer: Self-pay | Admitting: *Deleted

## 2012-05-16 NOTE — Telephone Encounter (Signed)
Pt informed and would like to start the testosterone injections. Please let us know when we can schedule him.

## 2012-05-16 NOTE — Telephone Encounter (Signed)
Schedule with patient every 2 weeks and recheck testosterone in 8 weeks.

## 2012-05-17 NOTE — Telephone Encounter (Signed)
Pt informed

## 2012-05-17 NOTE — Telephone Encounter (Signed)
Do you want to receive the 200mg  every 2 weeks?

## 2012-05-17 NOTE — Telephone Encounter (Signed)
Yes, 200mg  every 2 weeks.

## 2012-05-22 ENCOUNTER — Emergency Department (INDEPENDENT_AMBULATORY_CARE_PROVIDER_SITE_OTHER)
Admission: EM | Admit: 2012-05-22 | Discharge: 2012-05-22 | Disposition: A | Discharge status: Home / Self Care | Payer: BC Managed Care – PPO | Source: Home / Self Care | Attending: Family Medicine | Admitting: Family Medicine

## 2012-05-22 ENCOUNTER — Ambulatory Visit (INDEPENDENT_AMBULATORY_CARE_PROVIDER_SITE_OTHER): Payer: BC Managed Care – PPO | Admitting: Physician Assistant

## 2012-05-22 ENCOUNTER — Encounter: Payer: Self-pay | Admitting: *Deleted

## 2012-05-22 DIAGNOSIS — E291 Testicular hypofunction: Secondary | ICD-10-CM

## 2012-05-22 DIAGNOSIS — T63461A Toxic effect of venom of wasps, accidental (unintentional), initial encounter: Secondary | ICD-10-CM

## 2012-05-22 DIAGNOSIS — T6391XA Toxic effect of contact with unspecified venomous animal, accidental (unintentional), initial encounter: Secondary | ICD-10-CM

## 2012-05-22 HISTORY — DX: Bipolar disorder, unspecified: F31.9

## 2012-05-22 MED ORDER — TESTOSTERONE CYPIONATE 200 MG/ML IM SOLN
200.0000 mg | INTRAMUSCULAR | Status: DC
  Administered 2012-05-22: 200 mg via INTRAMUSCULAR

## 2012-05-22 NOTE — ED Notes (Signed)
Pt c/o bee sting to his RT ear x 1600 today. No OTC meds.

## 2012-05-22 NOTE — ED Provider Notes (Signed)
History     CSN: [id number]  Arrival date & time 05/22/12  1710   First MD Initiated Contact with Patient 05/22/12 1728      Chief Complaint  Patient presents with  . Insect Bite     HPI Comments: Patient complains of a wasp sting to his right upper ear about two hours prior to visit.  He notes that initial pain has resolved.  No itching or swelling.  He denies shortness of breath, chest tightness, difficulty swallowing, etc. Last tetanus immunization 2010.  The history is provided by the patient.    Past Medical History  Diagnosis Date  . Wears hearing aid   . Bipolar 1 disorder     History reviewed. No pertinent past surgical history.  Family History  Problem Relation Age of Onset  . Depression Father     History  Substance Use Topics  . Smoking status: Current Everyday Smoker  . Smokeless tobacco: Not on file  . Alcohol Use: Yes      Review of Systems  All other systems reviewed and are negative.    Allergies  Review of patient's allergies indicates no known allergies.  Home Medications   Current Outpatient Rx  Name Route Sig Dispense Refill  . TERAZOSIN HCL 5 MG PO CAPS Oral Take 5 mg by mouth at bedtime.    . AMBULATORY NON FORMULARY MEDICATION  Medication Name: Androgel 1.62%.  2 pump to each upper arm (total of 4 pumps) daily. 2 Bottle 2  . ATORVASTATIN CALCIUM 20 MG PO TABS Oral Take 1 tablet (20 mg total) by mouth daily. 30 tablet 2  . ESCITALOPRAM OXALATE 10 MG PO TABS Oral Take 10 mg by mouth daily.    . OMEGA-3 FATTY ACIDS 1000 MG PO CAPS Oral Take 4 g by mouth daily.      Marland Kitchen LITHIUM CARBONATE 150 MG PO CAPS Oral Take 150 mg by mouth 3 (three) times daily with meals.      . MELOXICAM 15 MG PO TABS Oral Take 1 tablet (15 mg total) by mouth daily. 30 tablet 2  . ORPHENADRINE CITRATE ER 100 MG PO TB12 Oral Take 1 tablet (100 mg total) by mouth 2 (two) times daily. 60 tablet 1  . VARDENAFIL HCL 10 MG PO TABS Oral Take 1 tablet (10 mg total) by mouth  daily as needed for erectile dysfunction. 10 tablet 0    BP 124/75  Pulse 60  Temp 98 F (36.7 C) (Oral)  Resp 16  Ht 5\' 8"  (1.727 m)  Wt 165 lb (74.844 kg)  BMI 25.09 kg/m2  SpO2 98%  Physical Exam Nursing notes and Vital Signs reviewed. Appearance:  Patient appears healthy, stated age, and in no acute distress Eyes:  Pupils are equal, round, and reactive to light and accomodation.  Extraocular movement is intact.  Conjunctivae are not inflamed  Ears:  Canals normal.  Tympanic membranes normal.  Right external ear reveals no swelling, erythema, warmth, or tenderness.  No evidence of insect sting at location pointed out by patient    ED Course  Procedures none       1. Wasp sting       MDM  No evidence local reaction.   However, will recommend that he continue to apply ice pack several times today and take Benadryl 50mg  at bedtime.  Call for any signs of infection:  Increased swelling, redness, heat, etc.        Lattie Haw, MD 05/22/12 [phone]

## 2012-05-22 NOTE — Progress Notes (Signed)
  Subjective:    Patient ID: Andres Stevens, male    DOB: 23-May-1946, 66 y.o.   MRN: [id number]  HPI  Testosterone shot given.  Review of Systems     Objective:   Physical Exam        Assessment & Plan:  Recheck testosterone in 8 weeks. Follow up in 2 weeks for next shot. Tandy Gaw PA-c

## 2012-05-25 ENCOUNTER — Telehealth: Payer: Self-pay | Admitting: Family Medicine

## 2012-06-05 ENCOUNTER — Ambulatory Visit (INDEPENDENT_AMBULATORY_CARE_PROVIDER_SITE_OTHER): Payer: BC Managed Care – PPO | Admitting: Family Medicine

## 2012-06-05 VITALS — BP 127/71 | HR 57

## 2012-06-05 DIAGNOSIS — E291 Testicular hypofunction: Secondary | ICD-10-CM

## 2012-06-05 MED ORDER — TESTOSTERONE CYPIONATE 200 MG/ML IM SOLN
200.0000 mg | Freq: Once | INTRAMUSCULAR | Status: AC
  Administered 2012-06-05: 200 mg via INTRAMUSCULAR

## 2012-06-05 NOTE — Progress Notes (Signed)
  Subjective:    Patient ID: Andres Stevens, male    DOB: [birth year], 66 y.o.   MRN: [id number] Testosterone Injection HPI    Review of Systems     Objective:   Physical Exam        Assessment & Plan:

## 2012-06-19 ENCOUNTER — Ambulatory Visit (INDEPENDENT_AMBULATORY_CARE_PROVIDER_SITE_OTHER): Payer: BC Managed Care – PPO | Admitting: Family Medicine

## 2012-06-19 VITALS — BP 130/70

## 2012-06-19 DIAGNOSIS — E291 Testicular hypofunction: Secondary | ICD-10-CM

## 2012-06-19 MED ORDER — TESTOSTERONE CYPIONATE 200 MG/ML IM SOLN
200.0000 mg | Freq: Once | INTRAMUSCULAR | Status: AC
  Administered 2012-06-19: 200 mg via INTRAMUSCULAR

## 2012-06-19 NOTE — Progress Notes (Signed)
  Subjective:    Patient ID: Andres Stevens, male    DOB: 04/12/1946, 66 y.o.   MRN: [id number]  HPI  Here for testosterone injection.  Review of Systems     Objective:   Physical Exam        Assessment & Plan:

## 2012-07-03 ENCOUNTER — Ambulatory Visit (INDEPENDENT_AMBULATORY_CARE_PROVIDER_SITE_OTHER): Payer: BC Managed Care – PPO | Admitting: Physician Assistant

## 2012-07-03 VITALS — BP 106/67 | HR 60

## 2012-07-03 DIAGNOSIS — E291 Testicular hypofunction: Secondary | ICD-10-CM

## 2012-07-03 MED ORDER — TESTOSTERONE CYPIONATE 200 MG/ML IM SOLN
200.0000 mg | Freq: Once | INTRAMUSCULAR | Status: AC
  Administered 2012-07-03: 200 mg via INTRAMUSCULAR

## 2012-07-03 NOTE — Progress Notes (Signed)
  Subjective:    Patient ID: Andres Stevens, male    DOB: [birth year], 66 y.o.   MRN: [id number] Testosterone injection HPI    Review of Systems     Objective:   Physical Exam        Assessment & Plan:

## 2012-07-17 ENCOUNTER — Ambulatory Visit (INDEPENDENT_AMBULATORY_CARE_PROVIDER_SITE_OTHER): Payer: BC Managed Care – PPO | Admitting: Physician Assistant

## 2012-07-17 VITALS — BP 120/70 | HR 60

## 2012-07-17 DIAGNOSIS — E291 Testicular hypofunction: Secondary | ICD-10-CM

## 2012-07-17 MED ORDER — TESTOSTERONE CYPIONATE 200 MG/ML IM SOLN
200.0000 mg | INTRAMUSCULAR | Status: DC
  Administered 2012-07-17: 200 mg via INTRAMUSCULAR

## 2012-07-17 NOTE — Progress Notes (Signed)
Patient ID: Andres Stevens, male   DOB: September 18, 1946, 66 y.o.   MRN: [id number] Testosterone inj given     Need office visit at next visit. Will also Need to recheck testosterone. Tandy Gaw PA-C

## 2012-07-31 ENCOUNTER — Encounter: Payer: Self-pay | Admitting: Physician Assistant

## 2012-07-31 ENCOUNTER — Ambulatory Visit: Payer: BC Managed Care – PPO

## 2012-07-31 ENCOUNTER — Ambulatory Visit (INDEPENDENT_AMBULATORY_CARE_PROVIDER_SITE_OTHER): Payer: BC Managed Care – PPO | Admitting: Physician Assistant

## 2012-07-31 VITALS — BP 128/72 | HR 82 | Wt 171.0 lb

## 2012-07-31 DIAGNOSIS — E291 Testicular hypofunction: Secondary | ICD-10-CM

## 2012-07-31 DIAGNOSIS — Z79899 Other long term (current) drug therapy: Secondary | ICD-10-CM

## 2012-07-31 DIAGNOSIS — Z23 Encounter for immunization: Secondary | ICD-10-CM

## 2012-07-31 DIAGNOSIS — E785 Hyperlipidemia, unspecified: Secondary | ICD-10-CM

## 2012-07-31 MED ORDER — TESTOSTERONE CYPIONATE 200 MG/ML IM SOLN
200.0000 mg | INTRAMUSCULAR | Status: DC
  Administered 2012-07-31: 200 mg via INTRAMUSCULAR

## 2012-07-31 NOTE — Progress Notes (Signed)
  Subjective:    Patient ID: Andres Stevens, male    DOB: 11-Jan-1946, 66 y.o.   MRN: [id number]  HPI Patient is a 66 yo male who presents to clinic to follow up on hypogonadisim. He feels much better on testosterone shots. He reports more energy, more erections and that his overall mood is better. Testosterone not been checked since we started shots. Will test today. He has not noticed any increased urinary problems. He is on medication for enlarged prostate. He takes medication regularly. He is supposed to be taking Lipitor but has not been. He is still on Tricor daily. Not been fasting today so we will recheck in 3-6 months. Encouraged patient to make better diet choices decreasing fatty foods and incorporating exercise. Hopefully LDL and TG will decrease.    Review of Systems     Objective:   Physical Exam  Constitutional: He is oriented to person, place, and time. He appears well-developed and well-nourished.  HENT:  Head: Normocephalic and atraumatic.  Cardiovascular: Normal rate, regular rhythm and normal heart sounds.   Pulmonary/Chest: Effort normal and breath sounds normal.  Neurological: He is alert and oriented to person, place, and time.  Skin: Skin is warm and dry.  Psychiatric: He has a normal mood and affect. His behavior is normal.          Assessment & Plan:  Male hypogonadism- shot given today. Testosterone, PSa and liver also checked. Will call with labs.   Hyperlipidemia/hypertrigleremia- Will check in 3-6 months. Needs to be fasting. Make better diet choices and try to exercise. Goal to get TG and LDL at goal. Not taking lipitor will take off list.    Flu shot given today.

## 2012-07-31 NOTE — Patient Instructions (Addendum)
Get labs and will call with results. Call to let me know if taking lipitor or not. Will recheck cholesterol once been on for 6 months.   2 weeks next testosterone shot.

## 2012-08-01 LAB — HEPATIC FUNCTION PANEL
Albumin: 4.1 g/dL (ref 3.5–5.2)
Alkaline Phosphatase: 39 U/L (ref 39–117)
Total Protein: 6.2 g/dL (ref 6.0–8.3)

## 2012-08-01 LAB — TESTOSTERONE, FREE, TOTAL, SHBG
Testosterone, Free: 55.7 pg/mL (ref 47.0–244.0)
Testosterone-% Free: 2.1 % (ref 1.6–2.9)
Testosterone: 270.71 ng/dL — ABNORMAL LOW (ref 300–890)

## 2012-10-10 ENCOUNTER — Encounter: Payer: Self-pay | Admitting: *Deleted

## 2012-10-10 ENCOUNTER — Emergency Department (INDEPENDENT_AMBULATORY_CARE_PROVIDER_SITE_OTHER)
Admission: EM | Admit: 2012-10-10 | Discharge: 2012-10-10 | Disposition: A | Discharge status: Home / Self Care | Payer: BC Managed Care – PPO | Source: Home / Self Care | Attending: Family Medicine | Admitting: Family Medicine

## 2012-10-10 ENCOUNTER — Emergency Department (INDEPENDENT_AMBULATORY_CARE_PROVIDER_SITE_OTHER): Payer: BC Managed Care – PPO

## 2012-10-10 ENCOUNTER — Telehealth: Payer: Self-pay | Admitting: Family Medicine

## 2012-10-10 DIAGNOSIS — S161XXA Strain of muscle, fascia and tendon at neck level, initial encounter: Secondary | ICD-10-CM

## 2012-10-10 DIAGNOSIS — M25519 Pain in unspecified shoulder: Secondary | ICD-10-CM

## 2012-10-10 DIAGNOSIS — S139XXA Sprain of joints and ligaments of unspecified parts of neck, initial encounter: Secondary | ICD-10-CM

## 2012-10-10 DIAGNOSIS — S0191XA Laceration without foreign body of unspecified part of head, initial encounter: Secondary | ICD-10-CM

## 2012-10-10 DIAGNOSIS — S0190XA Unspecified open wound of unspecified part of head, initial encounter: Secondary | ICD-10-CM

## 2012-10-10 DIAGNOSIS — S0993XA Unspecified injury of face, initial encounter: Secondary | ICD-10-CM

## 2012-10-10 DIAGNOSIS — W19XXXA Unspecified fall, initial encounter: Secondary | ICD-10-CM

## 2012-10-10 DIAGNOSIS — S199XXA Unspecified injury of neck, initial encounter: Secondary | ICD-10-CM

## 2012-10-10 DIAGNOSIS — M542 Cervicalgia: Secondary | ICD-10-CM

## 2012-10-10 DIAGNOSIS — S01512A Laceration without foreign body of oral cavity, initial encounter: Secondary | ICD-10-CM

## 2012-10-10 MED ORDER — KETOROLAC TROMETHAMINE 30 MG/ML IJ SOLN
30.0000 mg | Freq: Once | INTRAMUSCULAR | Status: AC
  Administered 2012-10-10: 30 mg via INTRAMUSCULAR

## 2012-10-10 NOTE — ED Notes (Signed)
Patient reports slipping on ice on his deck this AM hitting the top of his head and hyperextending his neck. He c/o neck pain, denies HA. He also bit the tip of his tongue in the fall. He has a small laceration to top of his head. All bleeding has stopped. Neuro intact, no anti-coagualts.

## 2012-10-10 NOTE — ED Provider Notes (Signed)
History     CSN: [id number]  Arrival date & time 10/10/12  1059   First MD Initiated Contact with Patient 10/10/12 1100      Chief Complaint  Patient presents with  . Neck Injury  . Fall    HPI  Fall earlier today.  Pt states that he accidentally slipped on ice earlier today. Fell and hit back of head.  No LOC with incident.  Had secondary tongue bite and posterior head laceration after fall.  Has also had some mild neck pain since this point.  Neck pain worse with movement.  Pt not on anticoagulation. Not on ASA. Had small amount of bleeding per pt that resolved.   Past Medical History  Diagnosis Date  . Wears hearing aid   . Bipolar 1 disorder     History reviewed. No pertinent past surgical history.  Family History  Problem Relation Age of Onset  . Depression Father     History  Substance Use Topics  . Smoking status: Current Every Day Smoker -- 0.2 packs/day    Types: Cigarettes  . Smokeless tobacco: Not on file  . Alcohol Use: Yes      Review of Systems  All other systems reviewed and are negative.    Allergies  Bee venom  Home Medications   Current Outpatient Rx  Name  Route  Sig  Dispense  Refill  . AMBULATORY NON FORMULARY MEDICATION      Medication Name: Androgel 1.62%.  2 pump to each upper arm (total of 4 pumps) daily.   2 Bottle   2   . ESCITALOPRAM OXALATE 10 MG PO TABS   Oral   Take 10 mg by mouth daily.         . FENOFIBRATE 145 MG PO TABS   Oral   Take 145 mg by mouth daily.         . OMEGA-3 FATTY ACIDS 1000 MG PO CAPS   Oral   Take 4 g by mouth daily.           Marland Kitchen LITHIUM CARBONATE 150 MG PO CAPS   Oral   Take 150 mg by mouth 3 (three) times daily with meals.           . MELOXICAM 15 MG PO TABS   Oral   Take 1 tablet (15 mg total) by mouth daily.   30 tablet   2   . ORPHENADRINE CITRATE ER 100 MG PO TB12   Oral   Take 1 tablet (100 mg total) by mouth 2 (two) times daily.   60 tablet   1   . TERAZOSIN  HCL 5 MG PO CAPS   Oral   Take 5 mg by mouth at bedtime.         Marland Kitchen VARDENAFIL HCL 10 MG PO TABS   Oral   Take 1 tablet (10 mg total) by mouth daily as needed for erectile dysfunction.   10 tablet   0     BP 147/82  Pulse 52  Resp 16  Ht 5\' 8"  (1.727 m)  Wt 165 lb (74.844 kg)  BMI 25.09 kg/m2  SpO2 100%  Physical Exam  Constitutional: He appears well-developed and well-nourished.  HENT:  Head: Normocephalic and atraumatic.    Mouth/Throat:         Small <0.5 cm superficial circular laceration. Minimal bleeding.   Small <0.5 cm tongue laceration. Non bleeding.    Eyes: Conjunctivae normal are normal. Pupils are equal,  round, and reactive to light.  Neck: Normal range of motion. Neck supple.       + mild posterior neck pain with resisted neck extension.    Cardiovascular: Normal rate and regular rhythm.   Pulmonary/Chest: Effort normal and breath sounds normal.  Abdominal: Soft.  Musculoskeletal: Normal range of motion.  Neurological: He is alert.  Skin: Skin is warm.    ED Course  Procedures (including critical care time)  Labs Reviewed - No data to display Dg Cervical Spine Complete  10/10/2012  *RADIOLOGY REPORT*  Clinical Data: Post fall this morning, now with neck pain radiating to the left shoulder  CERVICAL SPINE - COMPLETE 4+ VIEW  Comparison: None.  Findings:  C1 to the superior endplate of T1 is imaged on the lateral projection.  Normal alignment of the cervical spine.  No anterolisthesis or retrolisthesis.  The dens is normally positioned and the lateral masses of C1. Bilateral facets are normally aligned.  Cervical vertebral body heights are preserved. Prevertebral soft tissues are normal.  Moderate multilevel cervical spine DDD, worse at C5 - C6 and C6 - C7 with disc space height loss, end plate irregularity and osteophytosis.  Calcifications overlie the expected location of the bilateral carotid bulbs.  Limited visualization of the lung apices is  normal.  IMPRESSION: 1.  Moderate multilevel cervical spine DDD without definite acute findings. 2. Vascular calcifications overlie the expected location of the bilateral carotid bulbs.  Further evaluation with carotid Doppler ultrasound may be performed as clinically indicated.   Original Report Authenticated By: Tacey Ruiz, MD      1. Fall   2. Cervical strain   3. Laceration of head   4. Tongue laceration       MDM  c spine negative for any acute fractures.  Lacerations superficial in nature.  Toradol 30mg  im x1 Discussed mobic. Pt declined rx, feels that he doesn't need it.  Discussed MSK and neuro red flags at length including dizziness, confusion, weakness.  Follow up with PCP in 7-10 days for general reassessment.  Otherwise, follow up as needed.     The patient and/or caregiver has been counseled thoroughly with regard to treatment plan and/or medications prescribed including dosage, schedule, interactions, rationale for use, and possible side effects and they verbalize understanding. Diagnoses and expected course of recovery discussed and will return if not improved as expected or if the condition worsens. Patient and/or caregiver verbalized understanding.             Doree Albee, MD 10/10/12 1224

## 2012-10-10 NOTE — Telephone Encounter (Signed)
Got a copy of his scan. It looks like he may have some hardening of the arteries near his carotids. I would like to set him up for an ultrasound which is a Doppler. No radiation involved to look at the carotid arteries in his neck. If he is Re: had one recently then please let me know if not then we could certainly get him scheduled if he is okay with that.

## 2012-10-11 NOTE — Telephone Encounter (Signed)
Andres Stevens is ok with having the ultrasound.

## 2012-10-12 ENCOUNTER — Telehealth: Payer: Self-pay | Admitting: Emergency Medicine

## 2012-10-14 ENCOUNTER — Other Ambulatory Visit: Payer: Self-pay | Admitting: Family Medicine

## 2012-10-14 DIAGNOSIS — I6529 Occlusion and stenosis of unspecified carotid artery: Secondary | ICD-10-CM

## 2012-10-14 NOTE — Telephone Encounter (Signed)
Order placed

## 2012-10-15 ENCOUNTER — Ambulatory Visit (HOSPITAL_BASED_OUTPATIENT_CLINIC_OR_DEPARTMENT_OTHER)
Admission: RE | Admit: 2012-10-15 | Discharge: 2012-10-15 | Disposition: A | Discharge status: Home / Self Care | Payer: BC Managed Care – PPO | Source: Ambulatory Visit | Attending: Family Medicine | Admitting: Family Medicine

## 2012-10-15 ENCOUNTER — Other Ambulatory Visit: Payer: Self-pay | Admitting: Family Medicine

## 2012-10-15 DIAGNOSIS — I6529 Occlusion and stenosis of unspecified carotid artery: Secondary | ICD-10-CM | POA: Insufficient documentation

## 2012-10-15 DIAGNOSIS — E041 Nontoxic single thyroid nodule: Secondary | ICD-10-CM

## 2013-12-24 ENCOUNTER — Ambulatory Visit (INDEPENDENT_AMBULATORY_CARE_PROVIDER_SITE_OTHER): Payer: Managed Care, Other (non HMO) | Admitting: Family Medicine

## 2013-12-24 ENCOUNTER — Encounter: Payer: Self-pay | Admitting: Family Medicine

## 2013-12-24 VITALS — BP 130/68 | HR 62 | Ht 68.0 in | Wt 166.0 lb

## 2013-12-24 DIAGNOSIS — E538 Deficiency of other specified B group vitamins: Secondary | ICD-10-CM | POA: Insufficient documentation

## 2013-12-24 DIAGNOSIS — R197 Diarrhea, unspecified: Secondary | ICD-10-CM

## 2013-12-24 DIAGNOSIS — R413 Other amnesia: Secondary | ICD-10-CM

## 2013-12-24 DIAGNOSIS — Z0000 Encounter for general adult medical examination without abnormal findings: Secondary | ICD-10-CM

## 2013-12-24 DIAGNOSIS — R350 Frequency of micturition: Secondary | ICD-10-CM

## 2013-12-24 DIAGNOSIS — G3184 Mild cognitive impairment, so stated: Secondary | ICD-10-CM

## 2013-12-24 LAB — LIPID PANEL
CHOLESTEROL: 185 mg/dL (ref 0–200)
HDL: 48 mg/dL (ref >39)
LDL CALC: 93 mg/dL (ref 0–99)
TRIGLYCERIDES: 221 mg/dL — AB (ref <150)
Total CHOL/HDL Ratio: 3.9 Ratio
VLDL: 44 mg/dL — AB (ref 0–40)

## 2013-12-24 LAB — POCT URINALYSIS DIPSTICK
BILIRUBIN UA: NEGATIVE
GLUCOSE UA: NEGATIVE
Ketones, UA: NEGATIVE
Leukocytes, UA: NEGATIVE
NITRITE UA: NEGATIVE
Protein, UA: NEGATIVE
RBC UA: NEGATIVE
UROBILINOGEN UA: 0.2
pH, UA: 6

## 2013-12-24 LAB — COMPLETE METABOLIC PANEL WITH GFR
ALK PHOS: 61 U/L (ref 39–117)
ALT: 25 U/L (ref 0–53)
AST: 28 U/L (ref 0–37)
Albumin: 4.4 g/dL (ref 3.5–5.2)
BILIRUBIN TOTAL: 0.7 mg/dL (ref 0.2–1.2)
BUN: 23 mg/dL (ref 6–23)
CO2: 26 mEq/L (ref 19–32)
CREATININE: 1.01 mg/dL (ref 0.50–1.35)
Calcium: 10.4 mg/dL (ref 8.4–10.5)
Chloride: 106 mEq/L (ref 96–112)
GFR, Est African American: 87 mL/min
GFR, Est Non African American: 76 mL/min
Glucose, Bld: 58 mg/dL — ABNORMAL LOW (ref 70–99)
Potassium: 4.4 mEq/L (ref 3.5–5.3)
Sodium: 137 mEq/L (ref 135–145)
Total Protein: 6.4 g/dL (ref 6.0–8.3)

## 2013-12-24 LAB — HEMOGLOBIN A1C
Hgb A1c MFr Bld: 4.2 % (ref <5.7)
Mean Plasma Glucose: 74 mg/dL (ref <117)

## 2013-12-24 LAB — VITAMIN B12: Vitamin B-12: 571 pg/mL (ref 211–911)

## 2013-12-24 NOTE — Addendum Note (Signed)
Addended by: Teddy Spike on: 12/24/2013 10:17 AM   Modules accepted: Orders

## 2013-12-24 NOTE — Patient Instructions (Signed)
Smoking Cessation, Tips for Success If you are ready to quit smoking, congratulations! You have chosen to help yourself be healthier. Cigarettes bring nicotine, tar, carbon monoxide, and other irritants into your body. Your lungs, heart, and blood vessels will be able to work better without these poisons. There are many different ways to quit smoking. Nicotine gum, nicotine patches, a nicotine inhaler, or nicotine nasal spray can help with physical craving. Hypnosis, support groups, and medicines help break the habit of smoking. WHAT THINGS CAN I DO TO MAKE QUITTING EASIER?  Here are some tips to help you quit for good:  Pick a date when you will quit smoking completely. Tell all of your friends and family about your plan to quit on that date.  Do not try to slowly cut down on the number of cigarettes you are smoking. Pick a quit date and quit smoking completely starting on that day.  Throw away all cigarettes.   Clean and remove all ashtrays from your home, work, and car.   On a card, write down your reasons for quitting. Carry the card with you and read it when you get the urge to smoke.   Cleanse your body of nicotine. Drink enough water and fluids to keep your urine clear or pale yellow. Do this after quitting to flush the nicotine from your body.   Learn to predict your moods. Do not let a bad situation be your excuse to have a cigarette. Some situations in your life might tempt you into wanting a cigarette.   Never have "just one" cigarette. It leads to wanting another and another. Remind yourself of your decision to quit.   Change habits associated with smoking. If you smoked while driving or when feeling stressed, try other activities to replace smoking. Stand up when drinking your coffee. Brush your teeth after eating. Sit in a different chair when you read the paper. Avoid alcohol while trying to quit, and try to drink fewer caffeinated beverages. Alcohol and caffeine may urge  you to smoke.   Avoid foods and drinks that can trigger a desire to smoke, such as sugary or spicy foods and alcohol.   Ask people who smoke not to smoke around you.   Have something planned to do right after eating or having a cup of coffee. For example, plan to take a walk or exercise.   Try a relaxation exercise to calm you down and decrease your stress. Remember, you may be tense and nervous for the first 2 weeks after you quit, but this will pass.   Find new activities to keep your hands busy. Play with a pen, coin, or rubber band. Doodle or draw things on paper.   Brush your teeth right after eating. This will help cut down on the craving for the taste of tobacco after meals. You can also try mouthwash.   Use oral substitutes in place of cigarettes. Try using lemon drops, carrots, cinnamon sticks, or chewing gum. Keep them handy so they are available when you have the urge to smoke.   When you have the urge to smoke, try deep breathing.   Designate your home as a nonsmoking area.   If you are a heavy smoker, ask your health care provider about a prescription for nicotine chewing gum. It can ease your withdrawal from nicotine.   Reward yourself. Set aside the cigarette money you save and buy yourself something nice.   Look for support from others. Join a support group or   smoking cessation program. Ask someone at home or at work to help you with your plan to quit smoking.   Always ask yourself, "Do I need this cigarette or is this just a reflex?" Tell yourself, "Today, I choose not to smoke," or "I do not want to smoke." You are reminding yourself of your decision to quit.  Do not replace cigarette smoking with electronic cigarettes (commonly called e-cigarettes). The safety of e-cigarettes is unknown, and some may contain harmful chemicals.  If you relapse, do not give up! Plan ahead and think about what you will do the next time you get the urge to smoke.  HOW WILL  I FEEL WHEN I QUIT SMOKING? You may have symptoms of withdrawal because your body is used to nicotine (the addictive substance in cigarettes). You may crave cigarettes, be irritable, feel very hungry, cough often, get headaches, or have difficulty concentrating. The withdrawal symptoms are only temporary. They are strongest when you first quit but will go away within 10 14 days. When withdrawal symptoms occur, stay in control. Think about your reasons for quitting. Remind yourself that these are signs that your body is healing and getting used to being without cigarettes. Remember that withdrawal symptoms are easier to treat than the major diseases that smoking can cause.  Even after the withdrawal is over, expect periodic urges to smoke. However, these cravings are generally short lived and will go away whether you smoke or not. Do not smoke!  WHAT RESOURCES ARE AVAILABLE TO HELP ME QUIT SMOKING? Your health care provider can direct you to community resources or hospitals for support, which may include:  Group support.  Education.  Hypnosis.  Therapy. Document Released: 06/09/2004 Document Revised: 07/02/2013 Document Reviewed: 02/27/2013 [employer] Patient Information 2014 [employer].  

## 2013-12-24 NOTE — Progress Notes (Addendum)
Subjective:    Patient ID: Andres Stevens, male    DOB: 1946/06/30, 68 y.o.   MRN: [id number]  HPI HerF/U Mild cognitive impairment.  He is now on Aricept and Namenda for mild cognitive impairment. No specific concerns today. He is off the Exelon patch. In the notes it was mentioned that he is taking B12 for B12 deficiency. But he actually says he is not taking any B12 supplement.  He has had persistent diarrhea for him is 9 months. He denies any blood in the stool. He's not really sure what has caused it. He has cut back on putting fruit with his cereal and says he has noticed improvement. He's also cut any out of his diet has noticed improvement.   Review of Systems Comprehensive review of systems is negative today.  There were no vitals taken for this visit.    Allergies  Allergen Reactions  . Bee Venom     Hx of local rxn    Past Medical History  Diagnosis Date  . Wears hearing aid   . Bipolar 1 disorder     No past surgical history on file.  History   Social History  . Marital Status: Married    Spouse Name: N/A    Number of Children: N/A  . Years of Education: N/A   Occupational History  . Not on file.   Social History Main Topics  . Smoking status: Current Every Day Smoker -- 0.25 packs/day    Types: Cigarettes  . Smokeless tobacco: Not on file  . Alcohol Use: Yes  . Drug Use: No  . Sexual Activity:    Other Topics Concern  . Not on file   Social History Narrative  . No narrative on file    Family History  Problem Relation Age of Onset  . Depression Father     Outpatient Encounter Prescriptions as of 12/24/2013  Medication Sig  . AMBULATORY NON FORMULARY MEDICATION Medication Name: Androgel 1.62%.  2 pump to each upper arm (total of 4 pumps) daily.  Marland Kitchen escitalopram (LEXAPRO) 10 MG tablet Take 10 mg by mouth daily.  . fenofibrate (TRICOR) 145 MG tablet Take 145 mg by mouth daily.  . fish oil-omega-3 fatty acids 1000 MG capsule Take 4 g by mouth  daily.    Marland Kitchen lithium 150 MG capsule Take 150 mg by mouth 3 (three) times daily with meals.    . terazosin (HYTRIN) 5 MG capsule Take 5 mg by mouth at bedtime.  . vardenafil (LEVITRA) 10 MG tablet Take 1 tablet (10 mg total) by mouth daily as needed for erectile dysfunction.           Objective:   Physical Exam  Constitutional: He is oriented to person, place, and time. He appears well-developed and well-nourished.  HENT:  Head: Normocephalic and atraumatic.  Right Ear: External ear normal.  Left Ear: External ear normal.  Nose: Nose normal.  Mouth/Throat: Oropharynx is clear and moist.  Eyes: Conjunctivae and EOM are normal. Pupils are equal, round, and reactive to light.  Neck: Normal range of motion. Neck supple. No thyromegaly present.  Cardiovascular: Normal rate, regular rhythm, normal heart sounds and intact distal pulses.   Pulmonary/Chest: Effort normal and breath sounds normal.  Abdominal: Soft. Bowel sounds are normal. He exhibits no distension and no mass. There is no tenderness. There is no rebound and no guarding.  Musculoskeletal: Normal range of motion.  Lymphadenopathy:    He has no cervical adenopathy.  Neurological:  He is alert and oriented to person, place, and time. He has normal reflexes.  Skin: Skin is warm and dry.  Psychiatric: He has a normal mood and affect. His behavior is normal. Judgment and thought content normal.          Assessment & Plan:  Mild cognitive impairment - continue Aricept and Namenda. Stable.    Diarrhea-does not sound infectious but could consider testing for C. difficile and doing stool cultures. Since making some mild changes to his diet has actually helped already been encouraged him to cut back on fruit intake and maybe even consider removing dairy from her diet. Lactose intolerance is on the differential. If not getting more consistent stools in the next couple weeks with dietary changes and please let me know and we have to go  ahead and place orders for further workup.  B12 deficiency-we'll recheck B12 today.  F/U for CPE. Labs will be ordered today.

## 2013-12-25 ENCOUNTER — Telehealth: Payer: Self-pay | Admitting: *Deleted

## 2013-12-25 LAB — PSA: PSA: 0.6 ng/mL (ref <=4.00)

## 2013-12-25 NOTE — Telephone Encounter (Signed)
Pt stated that he wanted to know

## 2014-02-26 ENCOUNTER — Encounter: Payer: Managed Care, Other (non HMO) | Admitting: Family Medicine

## 2014-03-06 ENCOUNTER — Other Ambulatory Visit: Payer: Self-pay | Admitting: Family Medicine

## 2014-03-06 ENCOUNTER — Encounter: Payer: Self-pay | Admitting: Family Medicine

## 2014-03-06 ENCOUNTER — Ambulatory Visit (INDEPENDENT_AMBULATORY_CARE_PROVIDER_SITE_OTHER): Payer: Medicare Other | Admitting: Family Medicine

## 2014-03-06 VITALS — BP 113/62 | HR 58 | Ht 68.0 in | Wt 160.0 lb

## 2014-03-06 DIAGNOSIS — Z23 Encounter for immunization: Secondary | ICD-10-CM | POA: Diagnosis not present

## 2014-03-06 DIAGNOSIS — Z0000 Encounter for general adult medical examination without abnormal findings: Secondary | ICD-10-CM | POA: Diagnosis not present

## 2014-03-06 NOTE — Patient Instructions (Signed)
Keep up a regular exercise program and make sure you are eating a healthy diet Try to eat 4 servings of dairy a day, or if you are lactose intolerant take a calcium with vitamin D daily.  Your vaccines are up to date.   

## 2014-03-06 NOTE — Progress Notes (Signed)
Subjective:    Andres Stevens is a 68 y.o. male who presents for Medicare Annual/Subsequent preventive examination.   Preventive Screening-Counseling & Management  Tobacco History  Smoking status  . Current Every Day Smoker -- 0.25 packs/day  . Types: Cigarettes  Smokeless tobacco  . Not on file    Problems Prior to Visit 1.   Current Problems (verified) Patient Active Problem List   Diagnosis Date Noted  . B12 deficiency 12/24/2013  . Thyroid cyst 10/15/2012  . Mild cognitive impairment 05/13/2012  . HYPOGONADISM 07/13/2009  . FLANK PAIN 06/24/2009  . BIPOLAR AFFECTIVE DISORDER 05/26/2009  . HYPERTROPHY PROSTATE W/O UR OBST & OTH LUTS 05/26/2009  . 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 PEYRONIE'S DISEASE 05/26/2009    Medications Prior to Visit Current Outpatient Prescriptions on File Prior to Visit  Medication Sig Dispense Refill  . Donepezil HCl (ARICEPT PO) Take by mouth.      . Memantine HCl (NAMENDA PO) Take by mouth.       Current Facility-Administered Medications on File Prior to Visit  Medication Dose Route Frequency Provider Last Rate Last Dose  . testosterone cypionate (DEPOTESTOTERONE CYPIONATE) injection 200 mg  200 mg Intramuscular Q14 Days Jade L Breeback, PA-C   200 mg at 05/22/12 6387    Current Medications (verified) Current Outpatient Prescriptions  Medication Sig Dispense Refill  . Donepezil HCl (ARICEPT PO) Take by mouth.      . Memantine HCl (NAMENDA PO) Take by mouth.       Current Facility-Administered Medications  Medication Dose Route Frequency Provider Last Rate Last Dose  . testosterone cypionate (DEPOTESTOTERONE CYPIONATE) injection 200 mg  200 mg Intramuscular Q14 Days Jade L Breeback, PA-C   200 mg at 05/22/12 5643     Allergies (verified) Bee venom   PAST HISTORY  Family History Family History  Problem Relation Age of Onset  . Depression Father     Social History History  Substance Use Topics  . Smoking  status: Current Every Day Smoker -- 0.25 packs/day    Types: Cigarettes  . Smokeless tobacco: Not on file  . Alcohol Use: Yes    Are there smokers in your home (other than you)?  No  Risk Factors Current exercise habits: some exercise, works in yard and using exercise maching  Dietary issues discussed: None   Cardiac risk factors: advanced age (older than 58 for men, 35 for women), male gender and smoking/ tobacco exposure.  Depression Screen (Note: if answer to either of the following is "Yes", a more complete depression screening is indicated)   Q1: Over the past two weeks, have you felt down, depressed or hopeless? No  Q2: Over the past two weeks, have you felt little interest or pleasure in doing things? No  Have you lost interest or pleasure in daily life? No  Do you often feel hopeless? No  Do you cry easily over simple problems? No  Activities of Daily Living In your present state of health, do you have any difficulty performing the following activities?:  Driving? No Managing money?  No Feeding yourself? No Getting from bed to chair? No Climbing a flight of stairs? No Preparing food and eating?: No Bathing or showering? No Getting dressed: No Getting to the toilet? Yes, because of occ diarrhea Using the toilet:No Moving around from place to place: No In the past year have you fallen or had a near fall?:No    Hearing Difficulties: No Do you often ask people to speak  up or repeat themselves? No Do you experience ringing or noises in your ears? No Do you have difficulty understanding soft or whispered voices? No   Do you feel that you have a problem with memory? Yes  Do you often misplace items? Yes  Do you feel safe at home?  Yes  Cognitive Testing  Alert? Yes  Normal Appearance?Yes  Oriented to person? Yes  Place? Yes   Time? Yes  Recall of three objects?  Yes  Can perform simple calculations? Yes  Displays appropriate judgment?Yes  Can read the correct  time from a watch face? Not tested   Advanced Directives have been discussed with the patient? No   List the Names of Other Physician/Practitioners you currently use: 1.    Indicate any recent Medical Services you may have received from other than [facility] providers in the past year (date may be approximate).  Immunization History  Administered Date(s) Administered  . Influenza Split 07/31/2012  . Influenza Whole 07/13/2009  . Influenza-Unspecified 08/25/2013  . Pneumococcal Polysaccharide-23 07/13/2009  . Td 07/13/2009  . Zoster 11/14/2011    Screening Tests Health Maintenance  Topic Date Due  . Influenza Vaccine  04/25/2014  . Colonoscopy  09/26/2015  . Tetanus/tdap  07/14/2019  . Pneumococcal Polysaccharide Vaccine Age 93 And Over  Completed  . Zostavax  Addressed    All answers were reviewed with the patient and necessary referrals were made:  [name], MD   03/06/2014   History reviewed: allergies, current medications, past family history, past medical history, past social history, past surgical history and problem list  Review of Systems A comprehensive review of systems was negative.    Objective:     Vision by Snellen chart: eye exam is UTD.  Blood pressure 113/62, pulse 58, height 5\' 8"  (1.727 m), weight 160 lb (72.576 kg). Body mass index is 24.33 kg/(m^2).  BP 113/62  Pulse 58  Ht 5\' 8"  (1.727 m)  Wt 160 lb (72.576 kg)  BMI 24.33 kg/m2  General Appearance:    Alert, cooperative, no distress, appears stated age  Head:    Normocephalic, without obvious abnormality, atraumatic  Eyes:    PERRL, conjunctiva/corneas clear, EOM's intact, both eyes       Ears:    Normal TM's and external ear canals, both ears  Nose:   Nares normal, septum midline, mucosa normal, no drainage    or sinus tenderness  Throat:   Lips, mucosa, and tongue normal; teeth and gums normal  Neck:   Supple, symmetrical, trachea midline, no adenopathy;       thyroid:  No  enlargement/tenderness/nodules; no carotid   bruit or JVD  Back:     Symmetric, no curvature, ROM normal, no CVA tenderness  Lungs:     Clear to auscultation bilaterally, respirations unlabored  Chest wall:    No tenderness or deformity  Heart:    Regular rate and rhythm, S1 and S2 normal, no murmur, rub   or gallop  Abdomen:     Soft, non-tender, bowel sounds active all four quadrants,    no masses, no organomegaly  Genitalia:    Not performed  Rectal:    Not performed   Extremities:   Extremities normal, atraumatic, no cyanosis or edema  Pulses:   2+ and symmetric all extremities  Skin:   Skin color, texture, turgor normal, no rashes or lesions  Lymph nodes:   Cervical, supraclavicular nodes normal  Neurologic:   CNII-XII intact. Normal strength, sensation and  reflexes      throughout       Assessment:     Medicare Wellness Exam     Plan:     During the course of the visit the patient was educated and counseled about appropriate screening and preventive services including:    Pneumococcal vaccine   And for advanced directives  Medication updated  Vaccines up-to-date.  Tobacco abuse-encourage cessation.  Reviewed recent lab results and copy provided the patient  Followup in one year  Diet review for nutrition referral? Yes ____  Not Indicated _X__   Patient Instructions (the written plan) was given to the patient.  Medicare Attestation I have personally reviewed: The patient's medical and social history Their use of alcohol, tobacco or illicit drugs Their current medications and supplements The patient's functional ability including ADLs,fall risks, home safety risks, cognitive, and hearing and visual impairment Diet and physical activities Evidence for depression or mood disorders  The patient's weight, height, BMI, and visual acuity have been recorded in the chart.  I have made referrals, counseling, and provided education to the patient based on review  of the above and I have provided the patient with a written personalized care plan for preventive services.     Smita Lesh, MD   03/06/2014

## 2014-03-06 NOTE — Addendum Note (Signed)
Addended by: Beatrice Lecher D on: 03/06/2014 08:47 AM   Modules accepted: Level of Service

## 2014-03-24 ENCOUNTER — Emergency Department (INDEPENDENT_AMBULATORY_CARE_PROVIDER_SITE_OTHER)
Admission: EM | Admit: 2014-03-24 | Discharge: 2014-03-24 | Disposition: A | Discharge status: Home / Self Care | Payer: Medicare Other | Source: Home / Self Care | Attending: Emergency Medicine | Admitting: Emergency Medicine

## 2014-03-24 ENCOUNTER — Encounter: Payer: Self-pay | Admitting: Emergency Medicine

## 2014-03-24 ENCOUNTER — Ambulatory Visit: Payer: Medicare Other | Admitting: Physician Assistant

## 2014-03-24 DIAGNOSIS — T7840XA Allergy, unspecified, initial encounter: Secondary | ICD-10-CM | POA: Diagnosis not present

## 2014-03-24 DIAGNOSIS — T63461A Toxic effect of venom of wasps, accidental (unintentional), initial encounter: Secondary | ICD-10-CM

## 2014-03-24 DIAGNOSIS — T63441A Toxic effect of venom of bees, accidental (unintentional), initial encounter: Secondary | ICD-10-CM

## 2014-03-24 DIAGNOSIS — T6391XA Toxic effect of contact with unspecified venomous animal, accidental (unintentional), initial encounter: Secondary | ICD-10-CM

## 2014-03-24 MED ORDER — PREDNISONE (PAK) 10 MG PO TABS: ORAL_TABLET | ORAL | Status: DC

## 2014-03-24 NOTE — ED Notes (Signed)
Pt c/o insect bite to his LT upper arm with redness around the bite x 2 days. Denies fever.

## 2014-03-24 NOTE — ED Provider Notes (Signed)
CSN: [id number]     Arrival date & time 03/24/14  1002 History   First MD Initiated Contact with Patient 03/24/14 1017     Chief Complaint  Patient presents with  . Insect Bite   (Consider location/radiation/quality/duration/timing/severity/associated sxs/prior Treatment) HPI 2 days ago, bee sting left upper lateral arm. Progressively worsening pain and itchiness and swelling.--Worsening, despite OTC treatment with Benadryl. No bony pain. No numbness or focal weakness. No fever or chills. No syncope or focal neurologic symptoms. No chest pain or shortness of breath. Past Medical History  Diagnosis Date  . Wears hearing aid   . Bipolar 1 disorder    History reviewed. No pertinent past surgical history. Family History  Problem Relation Age of Onset  . Depression Father    History  Substance Use Topics  . Smoking status: Current Every Day Smoker -- 0.25 packs/day    Types: Cigarettes  . Smokeless tobacco: Not on file  . Alcohol Use: Yes    Review of Systems  All other systems reviewed and are negative.   Allergies  Bee venom  Home Medications   Prior to Admission medications   Medication Sig Start Date End Date Taking? Authorizing Provider  Donepezil HCl (ARICEPT PO) Take by mouth.    Historical Provider, MD  escitalopram (LEXAPRO) 20 MG tablet Take 20 mg by mouth daily.    Historical Provider, MD  lithium 300 MG tablet Take 300 mg by mouth 3 (three) times daily.    Historical Provider, MD  Memantine HCl (NAMENDA PO) Take by mouth.    Historical Provider, MD  predniSONE (STERAPRED UNI-PAK) 10 MG tablet Take as directed for 6 days.--Take 6 on day 1, 5 on day 2, 4 on day 3, then 3 tablets on day 4, then 2 tablets on day 5, then 1 on day 6. 03/24/14   Jacqulyn Cane, MD   BP 135/69  Pulse 56  Temp(Src) 98.2 F (36.8 C) (Oral)  Resp 16  Ht 5\' 8"  (1.727 m)  Wt 159 lb (72.122 kg)  BMI 24.18 kg/m2  SpO2 100% Physical Exam  Nursing note and vitals  reviewed. Constitutional: He is oriented to person, place, and time. He appears well-developed and well-nourished. No distress.  HENT:  Head: Normocephalic and atraumatic.  Mouth/Throat: Oropharynx is clear and moist. No oropharyngeal exudate.  Eyes: Conjunctivae and EOM are normal. Pupils are equal, round, and reactive to light. No scleral icterus.  Neck: Normal range of motion. Neck supple.  Cardiovascular: Normal rate and normal heart sounds.   Pulmonary/Chest: Effort normal. No respiratory distress. He has no wheezes. He has no rales.  Abdominal: He exhibits no distension.  Musculoskeletal: Normal range of motion.  Lymphadenopathy:    He has no cervical adenopathy.  Neurological: He is alert and oriented to person, place, and time.  Skin: Skin is warm. He is not diaphoretic.  Red, indurated, 3 x 3 cm area left mid lateral upper arm. Bite mark in the center but no foreign body or bleeding or drainage. No fluctuance or red streaks  Psychiatric: He has a normal mood and affect.    ED Course  Procedures (including critical care time) Labs Review Labs Reviewed - No data to display  Imaging Review No results found.   MDM   1. Allergic reaction, initial encounter   2. Bee sting, accidental or unintentional, initial encounter    Severe local reaction from bee sting left upper arm. Treatment options discussed, as well as risks, benefits, alternatives. Patient voiced  understanding and agreement with the following plans: Prednisone burst He declined prescription antihistamine, he prefers to use Zyrtec OTC. Other advice and other symptomatic care discussed Follow-up with your primary care doctor in 2-3 days if not improving, or sooner if symptoms become worse. Precautions discussed. Red flags discussed. Questions invited and answered. Patient voiced understanding and agreement.     Jacqulyn Cane, MD 03/24/14 [phone]

## 2014-06-16 ENCOUNTER — Ambulatory Visit (INDEPENDENT_AMBULATORY_CARE_PROVIDER_SITE_OTHER): Payer: Managed Care, Other (non HMO) | Admitting: Family Medicine

## 2014-06-16 ENCOUNTER — Encounter: Payer: Self-pay | Admitting: Family Medicine

## 2014-06-16 VITALS — BP 149/76 | HR 57 | Wt 158.0 lb

## 2014-06-16 DIAGNOSIS — H60399 Other infective otitis externa, unspecified ear: Secondary | ICD-10-CM | POA: Diagnosis not present

## 2014-06-16 DIAGNOSIS — H60393 Other infective otitis externa, bilateral: Secondary | ICD-10-CM

## 2014-06-16 DIAGNOSIS — H6120 Impacted cerumen, unspecified ear: Secondary | ICD-10-CM

## 2014-06-16 DIAGNOSIS — H6123 Impacted cerumen, bilateral: Secondary | ICD-10-CM

## 2014-06-16 MED ORDER — NEOMYCIN-POLYMYXIN-HC 1 % OT SOLN: OTIC | Status: DC

## 2014-06-16 NOTE — Progress Notes (Signed)
CC: Andres Stevens is a 68 y.o. male is here for left ear drum inflamed   Subjective: HPI:  Complains of bilateral hearing loss over the past week worsening on a daily basis. He was seen by his hearing aid specialist and they told him that he had an abundance of ear wax in the ears. Has been accompanied by pressure in both ear canals but no pain. Interventions have included hydrogen peroxide over-the-counter preparations without much benefit. He denies any other motor or sensory disturbances but does report nasal congestion. Denies fevers, chills, cough, postnasal drip. He has a history of chronic hearing loss but this is worsened from his baseline.   Review Of Systems Outlined In HPI  Past Medical History  Diagnosis Date  . Wears hearing aid   . Bipolar 1 disorder     No past surgical history on file. Family History  Problem Relation Age of Onset  . Depression Father     History   Social History  . Marital Status: Married    Spouse Name: N/A    Number of Children: N/A  . Years of Education: N/A   Occupational History  . Not on file.   Social History Main Topics  . Smoking status: Current Every Day Smoker -- 0.25 packs/day    Types: Cigarettes  . Smokeless tobacco: Not on file  . Alcohol Use: Yes  . Drug Use: No  . Sexual Activity:    Other Topics Concern  . Not on file   Social History Narrative  . No narrative on file     Objective: BP 149/76  Pulse 57  Wt 158 lb (71.668 kg)  General: Alert and Oriented, No Acute Distress HEENT: Pupils equal, round, reactive to light. Conjunctivae clear.  On initial examination he has bilateral cerumen impaction, following gentle irrigation and successful removal of the impaction he has mild erythema and edema of both canals. Tympanic membranes are intact with a clear and open middle ear. Pink inferior turbinates.  Moist mucous membranes, pharynx without inflammation nor lesions.  Neck supple without palpable lymphadenopathy  nor abnormal masses. Cranial nerves II through XII grossly intact Extremities: No peripheral edema.  Strong peripheral pulses.  Mental Status: No depression, anxiety, nor agitation. Skin: Warm and dry.  Assessment & Plan: Andres Stevens was seen today for left ear drum inflamed.  Diagnoses and associated orders for this visit:  Cerumen impaction, bilateral  Otitis, externa, infective, bilateral - NEOMYCIN-POLYMYXIN-HYDROCORTISONE (CORTISPORIN) 1 % SOLN otic solution; Four drops in affected ear(s) three times a day, keep in ear(s) for five minutes. Total five days.    Indication: Cerumen impaction of the ears Medical necessity statement: On physical examination, cerumen impairs clinically significant portions of the external auditory canal, and tympanic membrane. Noted obstructive, copious cerumen that cannot be removed without magnification and instrumentations requiring physician skills Consent: Discussed benefits and risks of procedure and verbal consent obtained Procedure: Patient was prepped for the procedure. Utilized an otoscope to assess and take note of the ear canal, the tympanic membrane, and the presence, amount, and placement of the cerumen. Gentle water irrigation and soft plastic curette was utilized to remove cerumen.  Post procedure examination: shows cerumen was completely removed. Patient tolerated procedure well. The patient is made aware that they may experience temporary vertigo, temporary hearing loss, and temporary discomfort. If these symptom last for more than 24 hours to call the clinic or proceed to the ED.   He had immediate return of his baseline hearing after  her successful cerumen impaction removal. I've asked him to use Cortisporin for the next 5 days due to some mild otitis externa. He can continue to use his hearing aids as long as he doesn't allow any of the Cortisporin to exposed to the hearing aid electronics.   Return if symptoms worsen or fail to  improve.

## 2014-06-25 ENCOUNTER — Encounter: Payer: Self-pay | Admitting: Family Medicine

## 2014-06-25 ENCOUNTER — Ambulatory Visit (INDEPENDENT_AMBULATORY_CARE_PROVIDER_SITE_OTHER): Payer: Managed Care, Other (non HMO) | Admitting: Family Medicine

## 2014-06-25 VITALS — BP 122/66 | HR 67 | Temp 98.7°F | Ht 68.0 in | Wt 161.0 lb

## 2014-06-25 DIAGNOSIS — G3184 Mild cognitive impairment, so stated: Secondary | ICD-10-CM | POA: Diagnosis not present

## 2014-06-25 DIAGNOSIS — Z23 Encounter for immunization: Secondary | ICD-10-CM

## 2014-06-25 DIAGNOSIS — N400 Enlarged prostate without lower urinary tract symptoms: Secondary | ICD-10-CM

## 2014-06-25 MED ORDER — DOXAZOSIN MESYLATE 1 MG PO TABS: 1.0000 mg | ORAL_TABLET | Freq: Every day | ORAL | Status: DC

## 2014-06-25 NOTE — Progress Notes (Signed)
   Subjective:    Patient ID: Andres Stevens, male    DOB: Jan 17, 1946, 68 y.o.   MRN: [id number]  Benign Prostatic Hypertrophy   BPH - Over the last 2 months has noticed getting up and urinating at night approx twice.  Occ noticed some cramping in his leg as well.  Some mild urinary hesitancy.  Some urgency.    Mild congnitive Impairment - has been on aricept and namenda was added abut 3 months ago.  He does get diarrhea.  He stopped his Aricept about 1 week ago to see if diarrhea would get better.   Review of Systems     Objective:   Physical Exam  Constitutional: He is oriented to person, place, and time. He appears well-developed and well-nourished.  HENT:  Head: Normocephalic and atraumatic.  Cardiovascular: Normal rate, regular rhythm and normal heart sounds.   Pulmonary/Chest: Effort normal and breath sounds normal.  Neurological: He is alert and oriented to person, place, and time.  Skin: Skin is warm and dry.  Psychiatric: He has a normal mood and affect. His behavior is normal.          Assessment & Plan:  Mild congnitive impairment- we discussed options.  Can restart Aricpet at a lower dose and consdier restart Namenda once goes generic in the next few months.  Cost has been prohibitive to him refilling this. He wants to think about it.  He also wants to know the statistics behind the benefit of Aricpet.     BPH- AUA sx score of 13/14 today  . Moderate for BPH sxs score. Discussed starting an alpha blocker. Warned about potential S.E. F/U in 2 months.    Flu vaccine given today.

## 2014-06-26 ENCOUNTER — Encounter: Payer: Self-pay | Admitting: Family Medicine

## 2014-08-13 ENCOUNTER — Telehealth: Payer: Self-pay | Admitting: Family Medicine

## 2014-08-13 MED ORDER — ATORVASTATIN CALCIUM 40 MG PO TABS: 40.0000 mg | ORAL_TABLET | Freq: Every day | ORAL | Status: DC

## 2014-08-13 NOTE — Telephone Encounter (Signed)
rx sent. We can check labs at f/u OV

## 2014-08-13 NOTE — Telephone Encounter (Signed)
Call pt: based on the new cholesterol guidelines I did plug in his risk factors and based on his current numbers his tenure risk of heart disease is around 16%. The new guidelines recommend possible treatment with a cholesterol-lowering drug [place] to reduce the risk of heart attack and stroke in patients who have a risk greater than 7.5%. I encouraged him to think about this consider it. If he's willing to try a statin them please let me know.

## 2014-08-13 NOTE — Telephone Encounter (Signed)
I called and spoke with Andres Stevens and he is agreeable to trying a statin at this time. He said to send script to pharmacy. Margette Fast, CMA

## 2014-10-22 ENCOUNTER — Encounter: Payer: Self-pay | Admitting: Family Medicine

## 2014-10-22 DIAGNOSIS — Z85828 Personal history of other malignant neoplasm of skin: Secondary | ICD-10-CM | POA: Insufficient documentation

## 2014-12-25 ENCOUNTER — Encounter: Payer: Self-pay | Admitting: Family Medicine

## 2015-02-04 ENCOUNTER — Telehealth: Payer: Self-pay | Admitting: Family Medicine

## 2015-02-04 NOTE — Telephone Encounter (Signed)
Patient called triage today stating he fell last week (01/26/15) and hit his head. Patient reports he has a bump/knot on his forehead over his left eye that won't go away. Patient reports he never went for an evaluation (no ED,UC, or PCP visit) after the fall and has experienced no other symptoms besides this bump from the fall. No headache, dizziness, vision changes, etc. There was an open f/u appointment on tomorrows schedule for Dr. Madilyn Fireman, placed the Pt in this spot for an evaluation. No further questions.

## 2015-02-05 ENCOUNTER — Ambulatory Visit: Payer: Medicare Other | Admitting: Family Medicine

## 2015-03-31 DIAGNOSIS — F3110 Bipolar disorder, current episode manic without psychotic features, unspecified: Secondary | ICD-10-CM | POA: Diagnosis not present

## 2015-06-29 DIAGNOSIS — I611 Nontraumatic intracerebral hemorrhage in hemisphere, cortical: Secondary | ICD-10-CM | POA: Diagnosis not present

## 2015-06-29 DIAGNOSIS — E785 Hyperlipidemia, unspecified: Secondary | ICD-10-CM | POA: Diagnosis not present

## 2015-06-29 DIAGNOSIS — G936 Cerebral edema: Secondary | ICD-10-CM | POA: Diagnosis not present

## 2015-06-29 DIAGNOSIS — R001 Bradycardia, unspecified: Secondary | ICD-10-CM | POA: Diagnosis not present

## 2015-06-29 DIAGNOSIS — F0390 Unspecified dementia without behavioral disturbance: Secondary | ICD-10-CM | POA: Diagnosis not present

## 2015-06-29 DIAGNOSIS — Z79899 Other long term (current) drug therapy: Secondary | ICD-10-CM | POA: Diagnosis not present

## 2015-06-29 DIAGNOSIS — F17210 Nicotine dependence, cigarettes, uncomplicated: Secondary | ICD-10-CM | POA: Diagnosis present

## 2015-06-29 DIAGNOSIS — I619 Nontraumatic intracerebral hemorrhage, unspecified: Secondary | ICD-10-CM | POA: Diagnosis not present

## 2015-06-29 DIAGNOSIS — F319 Bipolar disorder, unspecified: Secondary | ICD-10-CM | POA: Diagnosis not present

## 2015-06-29 DIAGNOSIS — D649 Anemia, unspecified: Secondary | ICD-10-CM | POA: Diagnosis not present

## 2015-06-29 DIAGNOSIS — R4182 Altered mental status, unspecified: Secondary | ICD-10-CM | POA: Diagnosis not present

## 2015-06-29 DIAGNOSIS — R51 Headache: Secondary | ICD-10-CM | POA: Diagnosis not present

## 2015-07-01 ENCOUNTER — Encounter: Payer: Managed Care, Other (non HMO) | Admitting: Family Medicine

## 2015-07-03 LAB — COMPREHENSIVE METABOLIC PANEL: Calcium: 10.5 mg/dL

## 2015-07-03 LAB — LIPID PANEL
CHOLESTEROL: 116 mg/dL (ref 0–200)
HDL: 41 mg/dL (ref 35–70)
LDL Cholesterol: 33 mg/dL
TRIGLYCERIDES: 208 mg/dL — AB (ref 40–160)

## 2015-07-03 LAB — BASIC METABOLIC PANEL
CREATININE: 1.2 mg/dL (ref 0.6–1.3)
Glucose: 94 mg/dL
Potassium: 4.1 mmol/L (ref 3.4–5.3)
Sodium: 140 mmol/L (ref 137–147)

## 2015-07-03 LAB — TSH: TSH: 2.61 u[IU]/mL (ref 0.41–5.90)

## 2015-07-03 LAB — RPR: RPR: NONREACTIVE

## 2015-07-07 ENCOUNTER — Ambulatory Visit: Payer: Managed Care, Other (non HMO) | Admitting: Family Medicine

## 2015-07-07 ENCOUNTER — Telehealth: Payer: Self-pay | Admitting: *Deleted

## 2015-07-07 NOTE — Telephone Encounter (Signed)
Phone note opened for care everywhere.Andres Stevens [place]

## 2015-07-08 ENCOUNTER — Encounter: Payer: Self-pay | Admitting: Family Medicine

## 2015-07-08 ENCOUNTER — Ambulatory Visit (INDEPENDENT_AMBULATORY_CARE_PROVIDER_SITE_OTHER): Payer: Managed Care, Other (non HMO) | Admitting: Family Medicine

## 2015-07-08 VITALS — BP 123/70 | HR 56 | Temp 97.6°F | Resp 16 | Wt 154.7 lb

## 2015-07-08 DIAGNOSIS — E215 Disorder of parathyroid gland, unspecified: Secondary | ICD-10-CM | POA: Diagnosis not present

## 2015-07-08 DIAGNOSIS — R7989 Other specified abnormal findings of blood chemistry: Secondary | ICD-10-CM

## 2015-07-08 NOTE — Progress Notes (Signed)
Subjective:    Patient ID: Andres Stevens, male    DOB: Oct 22, 1945, 69 y.o.   MRN: [id number]  HPI  Hospital follow-up for hemorrhagic stroke. He is a 69 year old male with a history of bipolar disease and dementia who started developing increasing memory problems and lack of self-care. They saw his neurologist to ordered an MRI of the brain. It showed a subacute right frontal lobe hemorrhage and he was sent to the emergency department on October 4. It looks like the hemorrhage was probably a couple of weeks old. He then had a CT angiographic of the head and neck which was negative for occlusion or aneurysm or vascular malformation. He was admitted into the neuro ICU for monitoring. He had a fairly normal echocardiogram with an EF of 60-65%. An apnea link with an AHI of 2.7. Cholesterol looks fantastic. A1c was 4.3. His lithium level was normal. He been on 900 mg daily for several years. The only thing significant was that his calcium and parathyroid levels were mildly elevated. They tried to collect a 24 hour urine calcium and creatinine but unfortunately the sample was not collected properly. He was discharged home on October 8 and has been receiving physical therapy at home. He is to follow back up with Dr. Berdine Addison his neurologist in 4 weeks for repeat CT and then possibly repeat MRI of the brain with and without contrast to rule out any underlying pathology. He was not on any blood thinning agents that he had been taking Aleve daily for joint pain. He does not take fish oil or any other products. Since then he has been doing well. His wife is here with him today. She feels like he is getting back to his baseline but not quite there. He has not had any residual weakness. She did ask to have family medical leave completed for her today since she is going to be out with him for the next 6 weeks while he is getting therapy until he completely recovers.   Review of Systems  BP 123/70 mmHg  Pulse 56   Temp(Src) 97.6 F (36.4 C) (Oral)  Resp 16  Wt 154 lb 11.2 oz (70.171 kg)  SpO2 100%    Allergies  Allergen Reactions  . Bee Venom     Hx of local rxn    Past Medical History  Diagnosis Date  . Wears hearing aid   . Bipolar 1 disorder ([place])     No past surgical history on file.  Social History   Social History  . Marital Status: Married    Spouse Name: N/A  . Number of Children: N/A  . Years of Education: N/A   Occupational History  . Not on file.   Social History Main Topics  . Smoking status: Current Every Day Smoker -- 0.25 packs/day    Types: Cigarettes  . Smokeless tobacco: Not on file  . Alcohol Use: Yes  . Drug Use: No  . Sexual Activity: Not on file   Other Topics Concern  . Not on file   Social History Narrative    Family History  Problem Relation Age of Onset  . Depression Father     Outpatient Encounter Prescriptions as of 07/08/2015  Medication Sig  . atorvastatin (LIPITOR) 40 MG tablet Take 1 tablet (40 mg total) by mouth daily.  . Donepezil HCl (ARICEPT PO) Take 10 mg by mouth daily.   Marland Kitchen doxazosin (CARDURA) 1 MG tablet Take 1 tablet (1 mg total) by mouth  daily.  . escitalopram (LEXAPRO) 20 MG tablet Take 20 mg by mouth daily.  Marland Kitchen lithium 300 MG tablet Take 300 mg by mouth 2 (two) times daily.    No facility-administered encounter medications on file as of 07/08/2015.          Objective:   Physical Exam  Constitutional: He is oriented to person, place, and time. He appears well-developed and well-nourished.  HENT:  Head: Normocephalic and atraumatic.  Right Ear: External ear normal.  Left Ear: External ear normal.  Nose: Nose normal.  Mouth/Throat: Oropharynx is clear and moist.  TMs and canals are clear.   Eyes: Conjunctivae and EOM are normal. Pupils are equal, round, and reactive to light.  Neck: Neck supple. No thyromegaly present.  Cardiovascular: Normal rate and normal heart sounds.   Pulmonary/Chest: Effort normal and  breath sounds normal.  Abdominal: Soft. Bowel sounds are normal. He exhibits no distension and no mass. There is no tenderness. There is no rebound and no guarding.  Lymphadenopathy:    He has no cervical adenopathy.  Neurological: He is alert and oriented to person, place, and time. He has normal reflexes. He displays normal reflexes. No cranial nerve deficit. He exhibits normal muscle tone. Coordination abnormal.  Skin: Skin is warm and dry.  Psychiatric: He has a normal mood and affect. His behavior is normal.    Difficulty tapping fingers in succession.        Assessment & Plan:  Hemorrhagic stroke-doing well. Recommend avoid any NSAIDs or any products that can thin the blood for now. He will follow-up with Dr. Berdine Addison and will have a repeat CT scheduled in about 4 weeks.  Elevated calcium and parathyroid level-we'll recheck this today. If they are still abnormal then will continue with further workup.  Dementia-some slight progression since the stroke but it sounds like he is getting back to his previous baseline. I did complete a family report for his wife to be home with him for the next 6 weeks as he recovers.  Bipolar disorder-his psychiatrist recently decreased his dose to 600 mg since being home from the hospital. His wife will keep a good eye on him and make sure he is doing well on this dose.

## 2015-07-09 LAB — PTH, INTACT AND CALCIUM
CALCIUM: 11.4 mg/dL — AB (ref 8.4–10.5)
PTH: 76 pg/mL — AB (ref 14–64)

## 2015-07-13 ENCOUNTER — Encounter: Payer: Self-pay | Admitting: Family Medicine

## 2015-07-13 NOTE — Addendum Note (Signed)
Addended by: Beatrice Lecher D on: 07/13/2015 10:01 AM   Modules accepted: Orders

## 2015-07-16 ENCOUNTER — Other Ambulatory Visit: Payer: Self-pay

## 2015-07-16 DIAGNOSIS — R7989 Other specified abnormal findings of blood chemistry: Secondary | ICD-10-CM

## 2015-07-17 LAB — VITAMIN D 25 HYDROXY (VIT D DEFICIENCY, FRACTURES): VIT D 25 HYDROXY: 58 ng/mL (ref 30–100)

## 2015-07-17 LAB — CALCIUM, URINE, 24 HOUR
Calcium, 24 hour urine: 90 mg/24 h (ref 55–300)
Calcium, Ur: 15 mg/dL

## 2015-07-21 DIAGNOSIS — F3110 Bipolar disorder, current episode manic without psychotic features, unspecified: Secondary | ICD-10-CM | POA: Diagnosis not present

## 2015-07-22 ENCOUNTER — Other Ambulatory Visit: Payer: Self-pay | Admitting: Family Medicine

## 2015-07-22 ENCOUNTER — Ambulatory Visit (INDEPENDENT_AMBULATORY_CARE_PROVIDER_SITE_OTHER): Payer: Managed Care, Other (non HMO)

## 2015-07-22 ENCOUNTER — Other Ambulatory Visit: Payer: Self-pay | Admitting: *Deleted

## 2015-07-22 DIAGNOSIS — M8580 Other specified disorders of bone density and structure, unspecified site: Secondary | ICD-10-CM | POA: Diagnosis not present

## 2015-07-22 DIAGNOSIS — R7989 Other specified abnormal findings of blood chemistry: Secondary | ICD-10-CM

## 2015-07-22 DIAGNOSIS — E213 Hyperparathyroidism, unspecified: Secondary | ICD-10-CM

## 2015-07-22 DIAGNOSIS — M8589 Other specified disorders of bone density and structure, multiple sites: Secondary | ICD-10-CM | POA: Diagnosis not present

## 2015-07-23 ENCOUNTER — Other Ambulatory Visit: Payer: Self-pay | Admitting: *Deleted

## 2015-07-23 MED ORDER — DOXAZOSIN MESYLATE 1 MG PO TABS: 1.0000 mg | ORAL_TABLET | Freq: Every day | ORAL | Status: DC

## 2015-09-01 ENCOUNTER — Encounter: Payer: Self-pay | Admitting: Family Medicine

## 2015-09-21 ENCOUNTER — Other Ambulatory Visit: Payer: Self-pay | Admitting: Family Medicine

## 2015-10-08 ENCOUNTER — Ambulatory Visit (INDEPENDENT_AMBULATORY_CARE_PROVIDER_SITE_OTHER): Payer: Medicare Other | Admitting: Family Medicine

## 2015-10-08 ENCOUNTER — Encounter: Payer: Self-pay | Admitting: Family Medicine

## 2015-10-08 VITALS — BP 124/68 | HR 64 | Temp 97.8°F | Wt 164.0 lb

## 2015-10-08 DIAGNOSIS — N401 Enlarged prostate with lower urinary tract symptoms: Secondary | ICD-10-CM

## 2015-10-08 DIAGNOSIS — Z8673 Personal history of transient ischemic attack (TIA), and cerebral infarction without residual deficits: Secondary | ICD-10-CM

## 2015-10-08 DIAGNOSIS — F319 Bipolar disorder, unspecified: Secondary | ICD-10-CM | POA: Diagnosis not present

## 2015-10-08 DIAGNOSIS — G3184 Mild cognitive impairment, so stated: Secondary | ICD-10-CM

## 2015-10-08 DIAGNOSIS — R351 Nocturia: Secondary | ICD-10-CM

## 2015-10-08 DIAGNOSIS — L299 Pruritus, unspecified: Secondary | ICD-10-CM

## 2015-10-08 LAB — POCT URINALYSIS DIPSTICK
BILIRUBIN UA: NEGATIVE
GLUCOSE UA: NEGATIVE
Ketones, UA: NEGATIVE
Leukocytes, UA: NEGATIVE
Nitrite, UA: NEGATIVE
Protein, UA: NEGATIVE
RBC UA: NEGATIVE
Urobilinogen, UA: 0.2
pH, UA: 5

## 2015-10-08 MED ORDER — DOXAZOSIN MESYLATE 2 MG PO TABS: 2.0000 mg | ORAL_TABLET | Freq: Every day | ORAL | Status: DC

## 2015-10-08 MED ORDER — FINASTERIDE 5 MG PO TABS: 5.0000 mg | ORAL_TABLET | Freq: Every day | ORAL | Status: DC

## 2015-10-08 MED ORDER — ATORVASTATIN CALCIUM 40 MG PO TABS: 40.0000 mg | ORAL_TABLET | Freq: Every day | ORAL | Status: DC

## 2015-10-08 NOTE — Progress Notes (Signed)
   Subjective:    Patient ID: Andres Stevens, male    DOB: 08/03/1946, 70 y.o.   MRN: [id number]  HPI 70 year old male comes in today to follow up for his chronic medical conditions. Unfortunately he suffered a hemorrhagic stroke in September. CT showed a right inferior frontal lobe hemorrhage.  MRI also showed evidence of prior subarachnoid hemorrhages. After that they weaned his lithium.  He came off in mid-November because of elevated calcium levels. Also stopped the namenda and Aricept. He has now been experiencing diffuse body itching. He will put on lotion and then take it off and then put it on again.  This started in December.  Feels like memory is about the same. Says the OCD is back as well.  Constantly blowing his nose.   Nocturia - getting up about 5 times at night. He has a known history of enlarged prostate. In fact we actually address some increase in nighttime symptoms about a year ago. He was started on Cardura at that time. At some point he came back off but says he was restarted on it in October when he was in the hospital.   Review of Systems     Objective:   Physical Exam  Constitutional: He is oriented to person, place, and time. He appears well-developed and well-nourished.  HENT:  Head: Normocephalic and atraumatic.  Cardiovascular: Normal rate, regular rhythm and normal heart sounds.   Pulmonary/Chest: Effort normal and breath sounds normal.  Neurological: He is alert and oriented to person, place, and time.  Skin: Skin is warm and dry.  Psychiatric: He has a normal mood and affect. His behavior is normal.          Assessment & Plan:  Pruritus-we'll evaluate liver enzymes as well as check for elevated eosinophil levels on CBC. I suspect that this is probably related to his OCD and his bipolar disorder since he is currently not on any therapy for this.  BPH-he has artery on an alpha-blocker. Increase the doxazosin to 2 mg and will add Proscar, a 5 alpha  reductase inhibitor. Recommend follow up in 2-3 months to see how he is doing with the medication. Next  Nocturia-most consistent with BPH. We did do a urinalysis today just to rule out UTI. It was normal.  BiPolar disorder-he was supposed to follow up with a psychiatrist in March. Encouraged his wife to go ahead and call and get him an appointment in the next few weeks. I really feel like some of his symptoms including itching and constant nose blowing may actually be due to his OCD and/or bipolar disorder. Especially since he is off all medications for this. I reassured her that I don't think this has anything to do with the stroke.  Mild cognitive impairment-his Aricept was discontinued during hospitalization as well. He is currently off of that. At some point we may need to get him more formally evaluated for Alzheimer's.

## 2015-10-13 DIAGNOSIS — E213 Hyperparathyroidism, unspecified: Secondary | ICD-10-CM | POA: Diagnosis not present

## 2015-10-20 ENCOUNTER — Telehealth: Payer: Self-pay | Admitting: Family Medicine

## 2015-10-20 DIAGNOSIS — I618 Other nontraumatic intracerebral hemorrhage: Secondary | ICD-10-CM | POA: Diagnosis not present

## 2015-10-20 DIAGNOSIS — G3184 Mild cognitive impairment, so stated: Secondary | ICD-10-CM | POA: Diagnosis not present

## 2015-10-20 NOTE — Telephone Encounter (Signed)
Please call patient: Based on his age because he is over 17 and has a history of tobacco abuse the current guidelines recommend a screening for abdominal aortic aneurysm. This is typically done with an ultrasound. This is recommended by the [place] preventative services task force. If he is okay with having this test done then please let me know and we can place the order.

## 2015-10-20 NOTE — Telephone Encounter (Signed)
Left message to call for recommendations

## 2015-10-22 NOTE — Telephone Encounter (Signed)
Patient advised of results and recommendations.  

## 2015-10-22 NOTE — Telephone Encounter (Signed)
I advised his wife of recommendations. She states he will not be doing an ultrasound and she was going to look it up. She wants to know if Dr Madilyn Fireman has received a copy of the labs from [facility].

## 2015-10-22 NOTE — Telephone Encounter (Signed)
Just looked it up.  Electrolytes are normal. Kidney function is mildly elevated but stable for him. Liver enzymes were normal so that does not explain the itching. I still think it's most likely related to his mood. Calcium and phosphorus levels look good. Vitamin D looked good at 23. Parathyroid was down a little bit which is good.

## 2015-10-25 DIAGNOSIS — H538 Other visual disturbances: Secondary | ICD-10-CM | POA: Diagnosis not present

## 2015-11-02 DIAGNOSIS — L570 Actinic keratosis: Secondary | ICD-10-CM | POA: Diagnosis not present

## 2015-11-02 DIAGNOSIS — Z85820 Personal history of malignant melanoma of skin: Secondary | ICD-10-CM | POA: Diagnosis not present

## 2015-11-02 DIAGNOSIS — E213 Hyperparathyroidism, unspecified: Secondary | ICD-10-CM | POA: Diagnosis not present

## 2015-11-02 DIAGNOSIS — Z85828 Personal history of other malignant neoplasm of skin: Secondary | ICD-10-CM | POA: Diagnosis not present

## 2015-11-02 DIAGNOSIS — L821 Other seborrheic keratosis: Secondary | ICD-10-CM | POA: Diagnosis not present

## 2015-11-02 DIAGNOSIS — D225 Melanocytic nevi of trunk: Secondary | ICD-10-CM | POA: Diagnosis not present

## 2015-11-15 DIAGNOSIS — E349 Endocrine disorder, unspecified: Secondary | ICD-10-CM | POA: Diagnosis not present

## 2015-12-03 DIAGNOSIS — E210 Primary hyperparathyroidism: Secondary | ICD-10-CM | POA: Diagnosis not present

## 2015-12-03 DIAGNOSIS — Z8679 Personal history of other diseases of the circulatory system: Secondary | ICD-10-CM | POA: Diagnosis not present

## 2015-12-03 DIAGNOSIS — F319 Bipolar disorder, unspecified: Secondary | ICD-10-CM | POA: Diagnosis not present

## 2015-12-03 DIAGNOSIS — Z79899 Other long term (current) drug therapy: Secondary | ICD-10-CM | POA: Diagnosis not present

## 2015-12-07 ENCOUNTER — Other Ambulatory Visit: Payer: Self-pay | Admitting: *Deleted

## 2015-12-07 ENCOUNTER — Encounter: Payer: Self-pay | Admitting: Family Medicine

## 2015-12-07 ENCOUNTER — Ambulatory Visit (INDEPENDENT_AMBULATORY_CARE_PROVIDER_SITE_OTHER): Payer: Medicare Other | Admitting: Family Medicine

## 2015-12-07 VITALS — BP 115/57 | HR 84 | Wt 165.0 lb

## 2015-12-07 DIAGNOSIS — R351 Nocturia: Secondary | ICD-10-CM | POA: Diagnosis not present

## 2015-12-07 DIAGNOSIS — N401 Enlarged prostate with lower urinary tract symptoms: Secondary | ICD-10-CM

## 2015-12-07 DIAGNOSIS — L853 Xerosis cutis: Secondary | ICD-10-CM

## 2015-12-07 DIAGNOSIS — E213 Hyperparathyroidism, unspecified: Secondary | ICD-10-CM

## 2015-12-07 NOTE — Patient Instructions (Signed)
Eucerin is another good moisturizer.   Split the Cardura.

## 2015-12-07 NOTE — Addendum Note (Signed)
Addended by: Teddy Spike on: 12/07/2015 04:11 PM   Modules accepted: Orders

## 2015-12-07 NOTE — Progress Notes (Signed)
   Subjective:    Patient ID: Andres Stevens, male    DOB: 1945-10-01, 70 y.o.   MRN: [id number]  HPI   He is here for follow-up for BPH today. When I last saw him we decided to increase his doxazosin to 2 mg and added Proscar 5 mg. Was getting up 5 times at night.    Having parathyroid in July.    Still having persistnat itching.Initially it was his whole body.  He is constantly nose blowing as well.  Now the itching mostly on the buttocks and legs.  Using vaseline and lubriderm. It feels hot and itchy sometimes.  Does feel like his skin has been more dry than usual.  Review of Systems     Objective:   Physical Exam  Constitutional: He is oriented to person, place, and time. He appears well-developed and well-nourished.  HENT:  Head: Normocephalic and atraumatic.  Cardiovascular: Normal rate, regular rhythm and normal heart sounds.   Pulmonary/Chest: Effort normal and breath sounds normal.  Neurological: He is alert and oriented to person, place, and time.  Skin: Skin is warm and dry.  Very dry skin on thighs. No rash.   Psychiatric: He has a normal mood and affect. His behavior is normal.          Assessment & Plan:  BPH- He really has not noticed much improvement in his symptoms. Since his blood pressures have also been running a little bit low will decrease his Cardura back down to 1 mg. He can split the tabs since he just filled a 90 day supply. Consider referral to Urology.  He wants to work on cutting back his fluid intake before bedtime first before referral. Encouraged to keep track of it for the next 2 weeks and then call me if he like to move forward. He could certainly have other underlying diagnoses such as overactive bladder which response to different types medication.  Dry Skin - will check thyroid level. Skin scraping on the right thigh to rule out tinea.   Hyperparathyroidism-scheduled for surgery in July.

## 2015-12-09 LAB — FUNGAL STAIN

## 2015-12-15 ENCOUNTER — Other Ambulatory Visit: Payer: Self-pay | Admitting: *Deleted

## 2015-12-15 DIAGNOSIS — F319 Bipolar disorder, unspecified: Secondary | ICD-10-CM | POA: Diagnosis not present

## 2015-12-15 DIAGNOSIS — F0390 Unspecified dementia without behavioral disturbance: Secondary | ICD-10-CM | POA: Diagnosis not present

## 2015-12-15 DIAGNOSIS — F429 Obsessive-compulsive disorder, unspecified: Secondary | ICD-10-CM | POA: Diagnosis not present

## 2015-12-15 NOTE — Telephone Encounter (Signed)
Pt's wife came by and stated that she thought that Dr. Madilyn Fireman was going to send Rx for Lexapro this was a historical medication. Looked back at last OV note to see if there was a note for this and there was not. Will fwd to pcp for f/u.Marland KitchenMarland KitchenAudelia Hives [place]

## 2015-12-16 MED ORDER — ESCITALOPRAM OXALATE 20 MG PO TABS: 20.0000 mg | ORAL_TABLET | Freq: Every day | ORAL | Status: DC

## 2015-12-16 NOTE — Telephone Encounter (Signed)
Script sent  

## 2016-01-18 DIAGNOSIS — F3170 Bipolar disorder, currently in remission, most recent episode unspecified: Secondary | ICD-10-CM | POA: Diagnosis not present

## 2016-01-18 DIAGNOSIS — F0150 Vascular dementia without behavioral disturbance: Secondary | ICD-10-CM | POA: Diagnosis not present

## 2016-01-18 DIAGNOSIS — F429 Obsessive-compulsive disorder, unspecified: Secondary | ICD-10-CM | POA: Diagnosis not present

## 2016-02-07 DIAGNOSIS — I618 Other nontraumatic intracerebral hemorrhage: Secondary | ICD-10-CM | POA: Diagnosis not present

## 2016-02-07 DIAGNOSIS — R4189 Other symptoms and signs involving cognitive functions and awareness: Secondary | ICD-10-CM | POA: Diagnosis not present

## 2016-03-13 DIAGNOSIS — I609 Nontraumatic subarachnoid hemorrhage, unspecified: Secondary | ICD-10-CM | POA: Diagnosis not present

## 2016-03-13 DIAGNOSIS — I619 Nontraumatic intracerebral hemorrhage, unspecified: Secondary | ICD-10-CM | POA: Diagnosis not present

## 2016-03-13 DIAGNOSIS — G9389 Other specified disorders of brain: Secondary | ICD-10-CM | POA: Diagnosis not present

## 2016-03-20 DIAGNOSIS — G3184 Mild cognitive impairment, so stated: Secondary | ICD-10-CM | POA: Diagnosis not present

## 2016-03-20 DIAGNOSIS — I618 Other nontraumatic intracerebral hemorrhage: Secondary | ICD-10-CM | POA: Diagnosis not present

## 2016-03-22 DIAGNOSIS — Z9089 Acquired absence of other organs: Secondary | ICD-10-CM | POA: Insufficient documentation

## 2016-03-25 HISTORY — PX: PARATHYROIDECTOMY: SHX19

## 2016-03-29 DIAGNOSIS — Z8673 Personal history of transient ischemic attack (TIA), and cerebral infarction without residual deficits: Secondary | ICD-10-CM | POA: Diagnosis not present

## 2016-03-29 DIAGNOSIS — Z6825 Body mass index (BMI) 25.0-25.9, adult: Secondary | ICD-10-CM | POA: Diagnosis not present

## 2016-03-29 DIAGNOSIS — F319 Bipolar disorder, unspecified: Secondary | ICD-10-CM | POA: Diagnosis not present

## 2016-03-29 DIAGNOSIS — G3184 Mild cognitive impairment, so stated: Secondary | ICD-10-CM | POA: Diagnosis not present

## 2016-03-29 DIAGNOSIS — Z8719 Personal history of other diseases of the digestive system: Secondary | ICD-10-CM | POA: Diagnosis not present

## 2016-03-29 DIAGNOSIS — E210 Primary hyperparathyroidism: Secondary | ICD-10-CM | POA: Diagnosis not present

## 2016-03-29 DIAGNOSIS — D351 Benign neoplasm of parathyroid gland: Secondary | ICD-10-CM | POA: Diagnosis not present

## 2016-03-29 DIAGNOSIS — Z91030 Bee allergy status: Secondary | ICD-10-CM | POA: Diagnosis not present

## 2016-03-29 DIAGNOSIS — Z79899 Other long term (current) drug therapy: Secondary | ICD-10-CM | POA: Diagnosis not present

## 2016-03-29 DIAGNOSIS — I999 Unspecified disorder of circulatory system: Secondary | ICD-10-CM | POA: Diagnosis not present

## 2016-03-31 ENCOUNTER — Encounter: Payer: Self-pay | Admitting: Physician Assistant

## 2016-03-31 ENCOUNTER — Ambulatory Visit (INDEPENDENT_AMBULATORY_CARE_PROVIDER_SITE_OTHER): Payer: Medicare Other | Admitting: Physician Assistant

## 2016-03-31 ENCOUNTER — Telehealth: Payer: Self-pay | Admitting: Physician Assistant

## 2016-03-31 VITALS — BP 101/46 | HR 70 | Ht 68.0 in | Wt 171.0 lb

## 2016-03-31 DIAGNOSIS — N401 Enlarged prostate with lower urinary tract symptoms: Secondary | ICD-10-CM | POA: Diagnosis not present

## 2016-03-31 DIAGNOSIS — H5789 Other specified disorders of eye and adnexa: Secondary | ICD-10-CM

## 2016-03-31 DIAGNOSIS — H578 Other specified disorders of eye and adnexa: Secondary | ICD-10-CM | POA: Diagnosis not present

## 2016-03-31 DIAGNOSIS — R351 Nocturia: Secondary | ICD-10-CM | POA: Diagnosis not present

## 2016-03-31 NOTE — Telephone Encounter (Signed)
Pt would like urology referral, prefers [employer] [facility].

## 2016-04-03 ENCOUNTER — Encounter: Payer: Self-pay | Admitting: Physician Assistant

## 2016-04-03 DIAGNOSIS — H5789 Other specified disorders of eye and adnexa: Secondary | ICD-10-CM | POA: Insufficient documentation

## 2016-04-03 NOTE — Progress Notes (Signed)
   Subjective:    Patient ID: Andres Stevens, male    DOB: 09/23/1946, 70 y.o.   MRN: [id number]  HPI Pt is a 70 yo male with hx of stroke that presents with medial right eye swelling and history of redness. For past 3 days notice swelling. Denies any vision changes. Called eye doctor and did not seem worried. No eye pain or known injury. Appears better this morning. No other numbness, speech issues, facial draw of right side. Not done anything to make better. No new medications. No fever, chills, sinus pressure, nasal congestion, itchy eyes.   Continues to struggle with frequent urination that is worse at night. On cardura 1mg  and finesteride 5mg . Denies any dizziness or weakness but BP was low at last visit.    Review of Systems  All other systems reviewed and are negative.      Objective:   Physical Exam  Constitutional: He is oriented to person, place, and time. He appears well-developed and well-nourished.  HENT:  Head: Normocephalic and atraumatic.  Right Ear: External ear normal.  Left Ear: External ear normal.  Nose: Nose normal.  Mouth/Throat: Oropharynx is clear and moist. No oropharyngeal exudate.  Eyes: Conjunctivae and EOM are normal. Pupils are equal, round, and reactive to light. Right eye exhibits no discharge. Left eye exhibits no discharge.    Cardiovascular: Normal rate, regular rhythm and normal heart sounds.   Neurological: He is alert and oriented to person, place, and time.  Psychiatric: He has a normal mood and affect. His behavior is normal.          Assessment & Plan:  Right eye swelling- unclear dx. Swelling is minimal and just over medial lacrimal duct of right eye. No redness, warmth or signs of infection. Discussed warm compresses. Discussed signs of infection. Call if symptoms change or worsen.   BPH with nocturia- not controlled. BP still low today do not recommend increasing cardura or finasteride today. Stay at same dose. Will make urology  referral at patients request and per PCP's last note. Discussed signs of hypotension. Increase some salt and stay hydrated to prevent hypotension. If feeling dizzy call office and can decrease medications further.

## 2016-04-03 NOTE — Telephone Encounter (Signed)
Andres Stevens,   Referral was placed. If you could communicate with patient regarding best location.

## 2016-04-14 ENCOUNTER — Encounter: Payer: Self-pay | Admitting: Physician Assistant

## 2016-04-14 ENCOUNTER — Ambulatory Visit (INDEPENDENT_AMBULATORY_CARE_PROVIDER_SITE_OTHER): Payer: Medicare Other | Admitting: Physician Assistant

## 2016-04-14 VITALS — BP 131/65 | HR 78 | Ht 68.0 in | Wt 169.0 lb

## 2016-04-14 DIAGNOSIS — R4189 Other symptoms and signs involving cognitive functions and awareness: Secondary | ICD-10-CM

## 2016-04-14 DIAGNOSIS — Z820 Family history of epilepsy and other diseases of the nervous system: Secondary | ICD-10-CM

## 2016-04-14 DIAGNOSIS — R410 Disorientation, unspecified: Secondary | ICD-10-CM | POA: Diagnosis not present

## 2016-04-14 DIAGNOSIS — Z818 Family history of other mental and behavioral disorders: Secondary | ICD-10-CM

## 2016-04-14 NOTE — Patient Instructions (Signed)
Neurologist appt to follow up.  Get medical alert bracelet.

## 2016-04-16 DIAGNOSIS — R4189 Other symptoms and signs involving cognitive functions and awareness: Secondary | ICD-10-CM | POA: Insufficient documentation

## 2016-04-16 DIAGNOSIS — Z818 Family history of other mental and behavioral disorders: Secondary | ICD-10-CM | POA: Insufficient documentation

## 2016-04-16 DIAGNOSIS — R410 Disorientation, unspecified: Secondary | ICD-10-CM | POA: Insufficient documentation
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
## 2016-04-18 DIAGNOSIS — F0150 Vascular dementia without behavioral disturbance: Secondary | ICD-10-CM | POA: Diagnosis not present

## 2016-04-20 DIAGNOSIS — F0150 Vascular dementia without behavioral disturbance: Secondary | ICD-10-CM | POA: Diagnosis not present

## 2016-04-20 DIAGNOSIS — I609 Nontraumatic subarachnoid hemorrhage, unspecified: Secondary | ICD-10-CM | POA: Diagnosis not present

## 2016-04-21 DIAGNOSIS — E210 Primary hyperparathyroidism: Secondary | ICD-10-CM | POA: Diagnosis not present

## 2016-04-21 DIAGNOSIS — Z9889 Other specified postprocedural states: Secondary | ICD-10-CM | POA: Diagnosis not present

## 2016-04-21 DIAGNOSIS — Z09 Encounter for follow-up examination after completed treatment for conditions other than malignant neoplasm: Secondary | ICD-10-CM | POA: Diagnosis not present

## 2016-05-01 ENCOUNTER — Other Ambulatory Visit: Payer: Self-pay

## 2016-05-01 DIAGNOSIS — G3184 Mild cognitive impairment, so stated: Secondary | ICD-10-CM | POA: Diagnosis not present

## 2016-05-01 MED ORDER — DOXAZOSIN MESYLATE 1 MG PO TABS: 1.0000 mg | ORAL_TABLET | Freq: Every day | ORAL | 1 refills | Status: DC

## 2016-05-08 DIAGNOSIS — F0150 Vascular dementia without behavioral disturbance: Secondary | ICD-10-CM | POA: Diagnosis not present

## 2016-05-09 ENCOUNTER — Ambulatory Visit: Payer: Medicare Other | Admitting: Physician Assistant

## 2016-05-15 DIAGNOSIS — F0150 Vascular dementia without behavioral disturbance: Secondary | ICD-10-CM | POA: Diagnosis not present

## 2016-05-18 DIAGNOSIS — F3170 Bipolar disorder, currently in remission, most recent episode unspecified: Secondary | ICD-10-CM | POA: Diagnosis not present

## 2016-05-18 DIAGNOSIS — F429 Obsessive-compulsive disorder, unspecified: Secondary | ICD-10-CM | POA: Diagnosis not present

## 2016-06-08 ENCOUNTER — Ambulatory Visit: Payer: No Typology Code available for payment source | Admitting: Family Medicine

## 2016-06-16 ENCOUNTER — Other Ambulatory Visit: Payer: Self-pay | Admitting: Family Medicine

## 2016-06-20 ENCOUNTER — Ambulatory Visit (INDEPENDENT_AMBULATORY_CARE_PROVIDER_SITE_OTHER): Payer: Medicare Other | Admitting: Family Medicine

## 2016-06-20 ENCOUNTER — Encounter: Payer: Self-pay | Admitting: Family Medicine

## 2016-06-20 VITALS — BP 116/61 | HR 66 | Wt 171.0 lb

## 2016-06-20 DIAGNOSIS — F319 Bipolar disorder, unspecified: Secondary | ICD-10-CM | POA: Diagnosis not present

## 2016-06-20 DIAGNOSIS — Z23 Encounter for immunization: Secondary | ICD-10-CM

## 2016-06-20 DIAGNOSIS — S60561A Insect bite (nonvenomous) of right hand, initial encounter: Secondary | ICD-10-CM

## 2016-06-20 DIAGNOSIS — Z8673 Personal history of transient ischemic attack (TIA), and cerebral infarction without residual deficits: Secondary | ICD-10-CM

## 2016-06-20 DIAGNOSIS — N401 Enlarged prostate with lower urinary tract symptoms: Secondary | ICD-10-CM | POA: Diagnosis not present

## 2016-06-20 DIAGNOSIS — W57XXXA Bitten or stung by nonvenomous insect and other nonvenomous arthropods, initial encounter: Secondary | ICD-10-CM

## 2016-06-20 DIAGNOSIS — R351 Nocturia: Secondary | ICD-10-CM

## 2016-06-20 NOTE — Progress Notes (Signed)
Subjective:    CC:   HPI:  F/U BPH - Currently on Cardura 1 mg. He is no longer on finasteride 5 mg tablet.We have stopped this medication because of low blood pressures. He does feel like he felt a little better off of the medication but not a huge difference.  Hx of stroke - he was recently diagnosed with a stroke as well as vascular dementia. He has not started the atorvastatin yet but it's at the right pharmacy ready to pick up. He has started working out every other day.  Stung or bitten on right hand yesterday. Using caladryl.  He says it's itching. No significant pain or discomfort. No other trauma or injury.  Bipolar disorder-no longer on Lamictal just taking sertraline.   Past medical history, Surgical history, Family history not pertinant except as noted below, Social history, Allergies, and medications have been entered into the medical record, reviewed, and corrections made.   Review of Systems: No fevers, chills, night sweats, weight loss, chest pain, or shortness of breath.   Objective:    General: Well Developed, well nourished, and in no acute distress.  Neuro: Alert and oriented x3, extra-ocular muscles intact, sensation grossly intact.  HEENT: Normocephalic, atraumatic  Skin: Warm and dry, no rashes. Cardiac: Regular rate and rhythm, no murmurs rubs or gallops, no lower extremity edema.  Respiratory: Clear to auscultation bilaterally. Not using accessory muscles, speaking in full sentences. Ext: Dorsum of right hand is erythematous. The does not cross the knuckles or the crease at the wrist. I don't see any open wound or drainage or papules. Initial some localized erythema and mild edema.   Impression and Recommendations:    BPH - He does have an appointment with urology next week. Encouraged to let me know of any specific changes.  Insect sting or bite-continue to use the Caladryl or take some oral Benadryl if needed. If the erythema spreads them please give [place] a  call. Otherwise it should hopefully heal with the next week.  Hx of stroke - encouraged him to go ahead and start the statin. Keep up the exercise.  Bipolar D/O - managed by psychiatry.

## 2016-06-29 DIAGNOSIS — R351 Nocturia: Secondary | ICD-10-CM | POA: Diagnosis not present

## 2016-06-29 DIAGNOSIS — N400 Enlarged prostate without lower urinary tract symptoms: Secondary | ICD-10-CM | POA: Diagnosis not present

## 2016-06-29 DIAGNOSIS — F0150 Vascular dementia without behavioral disturbance: Secondary | ICD-10-CM | POA: Diagnosis not present

## 2016-06-29 DIAGNOSIS — I10 Essential (primary) hypertension: Secondary | ICD-10-CM | POA: Diagnosis not present

## 2016-06-29 DIAGNOSIS — R350 Frequency of micturition: Secondary | ICD-10-CM | POA: Diagnosis not present

## 2016-07-10 DIAGNOSIS — I10 Essential (primary) hypertension: Secondary | ICD-10-CM | POA: Diagnosis not present

## 2016-07-10 DIAGNOSIS — F319 Bipolar disorder, unspecified: Secondary | ICD-10-CM | POA: Diagnosis not present

## 2016-07-10 DIAGNOSIS — F429 Obsessive-compulsive disorder, unspecified: Secondary | ICD-10-CM | POA: Diagnosis not present

## 2016-07-10 DIAGNOSIS — F0150 Vascular dementia without behavioral disturbance: Secondary | ICD-10-CM | POA: Diagnosis not present

## 2016-08-09 DIAGNOSIS — F0150 Vascular dementia without behavioral disturbance: Secondary | ICD-10-CM | POA: Diagnosis not present

## 2016-08-09 DIAGNOSIS — I10 Essential (primary) hypertension: Secondary | ICD-10-CM | POA: Diagnosis not present

## 2016-08-09 DIAGNOSIS — G44209 Tension-type headache, unspecified, not intractable: Secondary | ICD-10-CM | POA: Diagnosis not present

## 2016-10-04 DIAGNOSIS — H524 Presbyopia: Secondary | ICD-10-CM | POA: Diagnosis not present

## 2016-10-04 DIAGNOSIS — H2513 Age-related nuclear cataract, bilateral: Secondary | ICD-10-CM | POA: Diagnosis not present

## 2016-10-04 DIAGNOSIS — H52223 Regular astigmatism, bilateral: Secondary | ICD-10-CM | POA: Diagnosis not present

## 2016-10-04 DIAGNOSIS — H11153 Pinguecula, bilateral: Secondary | ICD-10-CM | POA: Diagnosis not present

## 2016-10-04 DIAGNOSIS — H43813 Vitreous degeneration, bilateral: Secondary | ICD-10-CM | POA: Diagnosis not present

## 2016-10-04 DIAGNOSIS — H5213 Myopia, bilateral: Secondary | ICD-10-CM | POA: Diagnosis not present

## 2016-10-04 DIAGNOSIS — H04123 Dry eye syndrome of bilateral lacrimal glands: Secondary | ICD-10-CM | POA: Diagnosis not present

## 2016-10-10 DIAGNOSIS — R6889 Other general symptoms and signs: Secondary | ICD-10-CM | POA: Diagnosis not present

## 2016-10-10 DIAGNOSIS — F319 Bipolar disorder, unspecified: Secondary | ICD-10-CM | POA: Diagnosis not present

## 2016-10-10 DIAGNOSIS — F429 Obsessive-compulsive disorder, unspecified: Secondary | ICD-10-CM | POA: Diagnosis not present

## 2016-10-10 DIAGNOSIS — I10 Essential (primary) hypertension: Secondary | ICD-10-CM | POA: Diagnosis not present

## 2016-10-12 ENCOUNTER — Ambulatory Visit: Payer: Medicare Other | Admitting: Family Medicine

## 2016-10-13 ENCOUNTER — Ambulatory Visit: Payer: Self-pay | Admitting: Family Medicine

## 2016-10-23 ENCOUNTER — Encounter: Payer: Self-pay | Admitting: Family Medicine

## 2016-10-23 ENCOUNTER — Ambulatory Visit (INDEPENDENT_AMBULATORY_CARE_PROVIDER_SITE_OTHER): Payer: Medicare Other | Admitting: Family Medicine

## 2016-10-23 VITALS — BP 102/51 | HR 77 | Ht 68.0 in | Wt 175.0 lb

## 2016-10-23 DIAGNOSIS — R351 Nocturia: Secondary | ICD-10-CM | POA: Diagnosis not present

## 2016-10-23 DIAGNOSIS — F0150 Vascular dementia without behavioral disturbance: Secondary | ICD-10-CM

## 2016-10-23 DIAGNOSIS — N182 Chronic kidney disease, stage 2 (mild): Secondary | ICD-10-CM

## 2016-10-23 DIAGNOSIS — F3170 Bipolar disorder, currently in remission, most recent episode unspecified: Secondary | ICD-10-CM | POA: Diagnosis not present

## 2016-10-23 DIAGNOSIS — R232 Flushing: Secondary | ICD-10-CM | POA: Diagnosis not present

## 2016-10-23 DIAGNOSIS — N401 Enlarged prostate with lower urinary tract symptoms: Secondary | ICD-10-CM | POA: Diagnosis not present

## 2016-10-23 LAB — CBC WITH DIFFERENTIAL/PLATELET
BASOS ABS: 0 {cells}/uL (ref 0–200)
Basophils Relative: 0 %
Eosinophils Absolute: 108 cells/uL (ref 15–500)
Eosinophils Relative: 2 %
HEMATOCRIT: 38.1 % — AB (ref 38.5–50.0)
HEMOGLOBIN: 12.9 g/dL — AB (ref 13.2–17.1)
LYMPHS ABS: 918 {cells}/uL (ref 850–3900)
Lymphocytes Relative: 17 %
MCH: 30.4 pg (ref 27.0–33.0)
MCHC: 33.9 g/dL (ref 32.0–36.0)
MCV: 89.9 fL (ref 80.0–100.0)
MPV: 9.8 fL (ref 7.5–12.5)
Monocytes Absolute: 324 cells/uL (ref 200–950)
Monocytes Relative: 6 %
NEUTROS PCT: 75 %
Neutro Abs: 4050 cells/uL (ref 1500–7800)
Platelets: 154 10*3/uL (ref 140–400)
RBC: 4.24 MIL/uL (ref 4.20–5.80)
RDW: 14.3 % (ref 11.0–15.0)
WBC: 5.4 10*3/uL (ref 3.8–10.8)

## 2016-10-23 LAB — COMPLETE METABOLIC PANEL WITH GFR
ALBUMIN: 4.2 g/dL (ref 3.6–5.1)
ALT: 20 U/L (ref 9–46)
AST: 22 U/L (ref 10–35)
Alkaline Phosphatase: 59 U/L (ref 40–115)
BUN: 25 mg/dL (ref 7–25)
CHLORIDE: 107 mmol/L (ref 98–110)
CO2: 26 mmol/L (ref 20–31)
Calcium: 9.3 mg/dL (ref 8.6–10.3)
Creat: 1.2 mg/dL — ABNORMAL HIGH (ref 0.70–1.18)
GFR, Est African American: 69 mL/min (ref >=60)
GFR, Est Non African American: 60 mL/min (ref >=60)
GLUCOSE: 95 mg/dL (ref 65–99)
POTASSIUM: 4.2 mmol/L (ref 3.5–5.3)
Sodium: 141 mmol/L (ref 135–146)
Total Bilirubin: 0.7 mg/dL (ref 0.2–1.2)
Total Protein: 6.7 g/dL (ref 6.1–8.1)

## 2016-10-23 LAB — PSA: PSA: 0.4 ng/mL (ref <=4.0)

## 2016-10-23 LAB — TSH: TSH: 1.09 mIU/L (ref 0.40–4.50)

## 2016-10-23 NOTE — Progress Notes (Signed)
Subjective:    CC: Hyperparathyroidism  HPI:  Follow-up hyperparathyroidism- hypercalcemia was initially detected during a hospitalization on 06/30/2015 when he was admitted for surgery intracerebral hemorrhage. Ionized calcium was 1.44 (1.15-1.32) on 06/30/15. PTH intact was inappropriately high, 93. Serum phosphorus was also low, 2.4. Lithium level at the time was 1 (06/29/15). Edema stopped abruptly during hospitalization at that time. He was then seen by Dr. Awilda Metro and it was felt that he was a candidate for surgery. He was then evaluated by general surgery on 12/03/2015 and they discussed the options of surgical treatment. He had the surgery in July and did well  He has a history of vascular dementia and bipolar disorder. He is still off of the lithium that he took from his 38 years and is currently just on Zoloft and lamictal.  BPH- On doxazosin.  Tolerateing well.  Lab Results  Component Value Date   PSA 0.60 12/24/2013   PSA 1.62 07/31/2012   PSA 0.88 05/13/2012    Is mostly here today because he is concerned about hot flashes. Can last anywhere from a couple minutes up to 10 or 15 minutes. 1 minute he can be hot and when to take extra clothing off and then feeling cold again. His wife says she is actually checked his temperature to make sure that he is not running a fever and he is not. He denies any open wounds or sores. Denies any other signs of infection. Denies any urinary symptoms.   He actually does complain of runny nose with some postnasal drip. His wife is wanting to know if it would be okay to maybe try an over-the-counter antihistamine such as Claritin or Zyrtec.  He also had a question about his renal function. She says the psychiatrist mentioned that he did have some impairment his kidneys and wanted to ask further about that today.  Past medical history, Surgical history, Family history not pertinant except as noted below, Social history, Allergies, and medications  have been entered into the medical record, reviewed, and corrections made.   Review of Systems: No fevers, chills, night sweats, weight loss, chest pain, or shortness of breath.   Objective:    General: Well Developed, well nourished, and in no acute distress.  Neuro: Alert and oriented x3, extra-ocular muscles intact, sensation grossly intact.  HEENT: Normocephalic, atraumatic  Skin: Warm and dry, no rashes. Cardiac: Regular rate and rhythm, no murmurs rubs or gallops, no lower extremity edema.  Respiratory: Clear to auscultation bilaterally. Not using accessory muscles, speaking in full sentences.   Impression and Recommendations:   Hyperparathyroidism- resolved s/p surgery.    BPH - Continue with current medication. Due for PSA check.  Hot flashes-will check thyroid level as well as electrolytes and check for anemia.  Rhinorrhea/postnasal drip-okay to try an over-the-counter antihistamine. If not working after 2 weeks and discontinue.  CKD 2 - Renal function impairment-did look back through his labs. His creatinine typically ranges between 1 and 1.2 since 2012. This would put him at a GFR between 60 and 70. Thus he probably does have some CKD 2. We'll keep an eye on this and check every 6 months.

## 2016-10-24 ENCOUNTER — Encounter: Payer: Self-pay | Admitting: Family Medicine

## 2016-10-24 DIAGNOSIS — N183 Chronic kidney disease, stage 3 unspecified: Secondary | ICD-10-CM | POA: Insufficient documentation

## 2016-11-13 ENCOUNTER — Other Ambulatory Visit: Payer: Self-pay | Admitting: Family Medicine

## 2016-11-14 DIAGNOSIS — F429 Obsessive-compulsive disorder, unspecified: Secondary | ICD-10-CM | POA: Diagnosis not present

## 2016-11-14 DIAGNOSIS — F3170 Bipolar disorder, currently in remission, most recent episode unspecified: Secondary | ICD-10-CM | POA: Diagnosis not present

## 2016-11-14 DIAGNOSIS — I10 Essential (primary) hypertension: Secondary | ICD-10-CM | POA: Diagnosis not present

## 2016-12-14 DIAGNOSIS — I10 Essential (primary) hypertension: Secondary | ICD-10-CM | POA: Diagnosis not present

## 2016-12-14 DIAGNOSIS — F319 Bipolar disorder, unspecified: Secondary | ICD-10-CM | POA: Diagnosis not present

## 2016-12-14 DIAGNOSIS — F429 Obsessive-compulsive disorder, unspecified: Secondary | ICD-10-CM | POA: Diagnosis not present

## 2016-12-18 ENCOUNTER — Encounter: Payer: Self-pay | Admitting: Family Medicine

## 2016-12-18 ENCOUNTER — Ambulatory Visit (INDEPENDENT_AMBULATORY_CARE_PROVIDER_SITE_OTHER): Payer: Medicare Other | Admitting: Family Medicine

## 2016-12-18 VITALS — BP 104/59 | HR 71 | Ht 68.0 in | Wt 168.0 lb

## 2016-12-18 DIAGNOSIS — R4189 Other symptoms and signs involving cognitive functions and awareness: Secondary | ICD-10-CM

## 2016-12-18 DIAGNOSIS — Z8673 Personal history of transient ischemic attack (TIA), and cerebral infarction without residual deficits: Secondary | ICD-10-CM

## 2016-12-18 DIAGNOSIS — F3170 Bipolar disorder, currently in remission, most recent episode unspecified: Secondary | ICD-10-CM

## 2016-12-18 DIAGNOSIS — F0150 Vascular dementia without behavioral disturbance: Secondary | ICD-10-CM | POA: Diagnosis not present

## 2016-12-18 NOTE — Addendum Note (Signed)
Addended by: Teddy Spike on: 12/18/2016 09:28 AM   Modules accepted: Orders

## 2016-12-18 NOTE — Patient Instructions (Signed)
Try to go for labs for cholesterol.

## 2016-12-18 NOTE — Progress Notes (Signed)
Subjective:    CC: 6 mo f/u stroke   HPI:  Hx of stroke - we had added lipitor 6 months ago. He's been tolerating it well without any side effects or myalgias.  Impaired cognition - Doing well overall. He is here today with his wife. No significant changes in mentation.  Bipolar disorder-psychiatry is actually weaning his Zoloft. They felt that the Zoloft may actually be causing some mania. It was initially switched to help with his OCD symptoms. He was starting to exercise for 10 hours a day. He is now down to half a tab. The plan is to discontinue the Zoloft.  Past medical history, Surgical history, Family history not pertinant except as noted below, Social history, Allergies, and medications have been entered into the medical record, reviewed, and corrections made.   Review of Systems: No fevers, chills, night sweats, weight loss, chest pain, or shortness of breath.   Objective:    General: Well Developed, well nourished, and in no acute distress.  Neuro: Alert and oriented x3, extra-ocular muscles intact, sensation grossly intact.  HEENT: Normocephalic, atraumatic  Skin: Warm and dry, no rashes. Cardiac: Regular rate and rhythm, no murmurs rubs or gallops, no lower extremity edema.  Respiratory: Clear to auscultation bilaterally. Not using accessory muscles, speaking in full sentences.   Impression and Recommendations:    Impaired cognition - Stable. No significant changes from previous.  Hx of hemorrhagic stroke - continue with statin. He with regular exercise and maintaining a healthy weight.  Bipolar D/O - stable. Now weaning off of Zoloft.  Declines colon cancer screening as well.

## 2017-01-02 DIAGNOSIS — Z8673 Personal history of transient ischemic attack (TIA), and cerebral infarction without residual deficits: Secondary | ICD-10-CM | POA: Diagnosis not present

## 2017-01-02 DIAGNOSIS — F0150 Vascular dementia without behavioral disturbance: Secondary | ICD-10-CM | POA: Diagnosis not present

## 2017-01-02 LAB — LIPID PANEL W/REFLEX DIRECT LDL
CHOL/HDL RATIO: 2.7 ratio (ref <5.0)
CHOLESTEROL: 123 mg/dL (ref <200)
HDL: 46 mg/dL (ref >40)
LDL-Cholesterol: 56 mg/dL
NON-HDL CHOLESTEROL (CALC): 77 mg/dL (ref <130)
Triglycerides: 131 mg/dL (ref <150)

## 2017-01-03 DIAGNOSIS — I10 Essential (primary) hypertension: Secondary | ICD-10-CM | POA: Diagnosis not present

## 2017-01-03 DIAGNOSIS — F0150 Vascular dementia without behavioral disturbance: Secondary | ICD-10-CM | POA: Diagnosis not present

## 2017-01-03 DIAGNOSIS — G44209 Tension-type headache, unspecified, not intractable: Secondary | ICD-10-CM | POA: Diagnosis not present

## 2017-01-05 ENCOUNTER — Other Ambulatory Visit: Payer: Self-pay | Admitting: Family Medicine

## 2017-02-01 ENCOUNTER — Ambulatory Visit (INDEPENDENT_AMBULATORY_CARE_PROVIDER_SITE_OTHER): Payer: Medicare Other | Admitting: Family Medicine

## 2017-02-01 ENCOUNTER — Encounter: Payer: Self-pay | Admitting: Family Medicine

## 2017-02-01 ENCOUNTER — Ambulatory Visit (INDEPENDENT_AMBULATORY_CARE_PROVIDER_SITE_OTHER): Payer: Medicare Other

## 2017-02-01 DIAGNOSIS — M1711 Unilateral primary osteoarthritis, right knee: Secondary | ICD-10-CM

## 2017-02-01 DIAGNOSIS — M25461 Effusion, right knee: Secondary | ICD-10-CM

## 2017-02-01 DIAGNOSIS — M170 Bilateral primary osteoarthritis of knee: Secondary | ICD-10-CM | POA: Insufficient documentation

## 2017-02-01 DIAGNOSIS — M25462 Effusion, left knee: Secondary | ICD-10-CM | POA: Diagnosis not present

## 2017-02-01 MED ORDER — ACETAMINOPHEN ER 650 MG PO TBCR: 1300.0000 mg | EXTENDED_RELEASE_TABLET | Freq: Three times a day (TID) | ORAL | 3 refills | Status: DC | PRN

## 2017-02-01 NOTE — Progress Notes (Signed)
   Subjective:    I'm seeing this patient as a consultation for:  Dr. Beatrice Lecher  CC: Right knee pain  HPI: This is a pleasant 71 year old male, for months he's had increasing pain in his right knee, has been working out more frequently lately. Others increasing swelling, pain at the medial joint line, moderate, persistent without radiation. Recently had a hemorrhagic stroke so unable to take NSAIDs but is able to use a sediment if an.  Past medical history:  Negative.  See flowsheet/record as well for more information.  Surgical history: Negative.  See flowsheet/record as well for more information.  Family history: Negative.  See flowsheet/record as well for more information.  Social history: Negative.  See flowsheet/record as well for more information.  Allergies, and medications have been entered into the medical record, reviewed, and no changes needed.   Review of Systems: No headache, visual changes, nausea, vomiting, diarrhea, constipation, dizziness, abdominal pain, skin rash, fevers, chills, night sweats, weight loss, swollen lymph nodes, body aches, joint swelling, muscle aches, chest pain, shortness of breath, mood changes, visual or auditory hallucinations.   Objective:   General: Well Developed, well nourished, and in no acute distress.  Neuro/Psych: Alert and oriented x3, extra-ocular muscles intact, able to move all 4 extremities, sensation grossly intact. Skin: Warm and dry, no rashes noted.  Respiratory: Not using accessory muscles, speaking in full sentences, trachea midline.  Cardiovascular: Pulses palpable, no extremity edema. Abdomen: Does not appear distended. Right Knee: Swollen and tender to palpation at the medial joint line ROM normal in flexion and extension and lower leg rotation. Ligaments with solid consistent endpoints including ACL, PCL, LCL, MCL. Negative Mcmurray's and provocative meniscal tests. Non painful patellar compression. Patellar and  quadriceps tendons unremarkable. Hamstring and quadriceps strength is normal.  Procedure: Real-time Ultrasound Guided Injection of right knee Device: GE Logiq E  Verbal informed consent obtained.  Time-out conducted.  Noted no overlying erythema, induration, or other signs of local infection.  Skin prepped in a sterile fashion.  Local anesthesia: Topical Ethyl chloride.  With sterile technique and under real time ultrasound guidance:  Aspirated 20 mL straw-colored fluid, syringe switched and 1 mL Kenalog 40, 2 mL lidocaine, 2 mL bupivacaine injected easily Completed without difficulty  Pain immediately resolved suggesting accurate placement of the medication.  Advised to call if fevers/chills, erythema, induration, drainage, or persistent bleeding.  Images permanently stored and available for review in the ultrasound unit.  Impression: Technically successful ultrasound guided injection.  Impression and Recommendations:   This case required medical decision making of moderate complexity.  Primary osteoarthritis of right knee Aspiration and injection as above, x-rays, Tylenol, return to see me in one month.

## 2017-02-01 NOTE — Progress Notes (Signed)
Subjective:    Patient ID: Andres Stevens, male    DOB: 29-Oct-1945, 71 y.o.   MRN: [id number]  HPI She comes in today complaining of right knee swelling 3 days. He feels like he is having some pain in the knee. He's been using heat on it. He denies any chest pain or shortness of breath. Is been working out for about 8 hours a day. He's been doing squats with free weights. He started to feel some discomfort in that knee during the squats and over the last 3 days it has been very bothersome to the point retraction keeping him awake at night. It's actually swollen. He has been been using heat on it and a rub.     Review of Systems  BP 129/61   Pulse 77   Ht 5\' 8"  (1.727 m)   Wt 169 lb (76.7 kg)   SpO2 100%   BMI 25.70 kg/m     Allergies  Allergen Reactions  . Bee Venom     Hx of local rxn    Past Medical History:  Diagnosis Date  . Bipolar 1 disorder ([place])   . Wears hearing aid     No past surgical history on file.  Social History   Social History  . Marital status: Married    Spouse name: N/A  . Number of children: N/A  . Years of education: N/A   Occupational History  . Not on file.   Social History Main Topics  . Smoking status: Former Smoker    Packs/day: 0.25    Types: Cigarettes    Quit date: 06/26/2015  . Smokeless tobacco: Never Used  . Alcohol use 0.0 oz/week  . Drug use: No  . Sexual activity: Not on file   Other Topics Concern  . Not on file   Social History Narrative  . No narrative on file    Family History  Problem Relation Age of Onset  . Depression Father     Outpatient Encounter Prescriptions as of 02/01/2017  Medication Sig  . atorvastatin (LIPITOR) 40 MG tablet TAKE ONE TABLET BY MOUTH ONCE DAILY  . B Complex-C CAPS Take 1 capsule by mouth daily.  . baclofen (LIORESAL) 10 MG tablet Take 10 mg by mouth.  . Cholecalciferol (VITAMIN D) 2000 units tablet Take 4,000 Units by mouth daily.  . DiphenhydrAMINE HCl (ALLERGY MED PO) Take  by mouth.  . doxazosin (CARDURA) 1 MG tablet TAKE ONE TABLET BY MOUTH ONCE DAILY  . lamoTRIgine (LAMICTAL) 25 MG tablet Take 75 mg by mouth daily.  . [DISCONTINUED] acetaminophen (TYLENOL) 650 MG CR tablet Take 2 tablets by mouth as needed.  Marland Kitchen acetaminophen (TYLENOL) 650 MG CR tablet Take 2 tablets (1,300 mg total) by mouth every 8 (eight) hours as needed for pain.   No facility-administered encounter medications on file as of 02/01/2017.          Objective:   Physical Exam  Constitutional: He is oriented to person, place, and time. He appears well-developed and well-nourished.  HENT:  Head: Normocephalic and atraumatic.  Eyes: Conjunctivae and EOM are normal.  Cardiovascular: Normal rate.   Pulmonary/Chest: Effort normal.  Musculoskeletal:  Right knee is swollen and warm to touch. No significant erythema. Nontender over the medial or lateral joint lines. He is tender over the distal attachment of patellar tendon. Over the tibial tuberosity. Normal mobility of the patella. Negative McMurray's. Strength is 5 out of 5 at the hip knee and ankle. No significant  crepitus with flexion and extension. Some discomfort with valgus stress on the knee.  Neurological: He is alert and oriented to person, place, and time.  Skin: Skin is dry. No pallor.  Psychiatric: He has a normal mood and affect. His behavior is normal.  Vitals reviewed.       Assessment & Plan:  Right knee pain - Suspect flare of osteoarthritis especially with weight lifting and doing squats. Also consider a cartilage tear though did have a negative McMurray's on exam today. Asked Dr. Dianah Field to do a ultrasound-guided injection as he likely has some significant arthritis making it more difficult for an injection. X-rays ordered as well. He will follow-up in one month with Dr. Dianah Field for further evaluation.

## 2017-02-01 NOTE — Assessment & Plan Note (Signed)
Aspiration and injection as above, x-rays, Tylenol, return to see me in one month.

## 2017-03-05 ENCOUNTER — Ambulatory Visit (INDEPENDENT_AMBULATORY_CARE_PROVIDER_SITE_OTHER): Payer: Medicare Other | Admitting: Sports Medicine

## 2017-03-05 ENCOUNTER — Encounter: Payer: Self-pay | Admitting: Sports Medicine

## 2017-03-05 DIAGNOSIS — M1711 Unilateral primary osteoarthritis, right knee: Secondary | ICD-10-CM | POA: Diagnosis not present

## 2017-03-05 NOTE — Progress Notes (Signed)
  Subjective:    CC: follow-up  HPI: Knee osteoarthritis: Improved significantly with steroid injection, still has a bit of pain with physical activity.  Past medical history:  Negative.  See flowsheet/record as well for more information.  Surgical history: Negative.  See flowsheet/record as well for more information.  Family history: Negative.  See flowsheet/record as well for more information.  Social history: Negative.  See flowsheet/record as well for more information.  Allergies, and medications have been entered into the medical record, reviewed, and no changes needed.   Review of Systems: No fevers, chills, night sweats, weight loss, chest pain, or shortness of breath.   Objective:    General: Well Developed, well nourished, and in no acute distress.  Neuro: Alert and oriented x3, extra-ocular muscles intact, sensation grossly intact.  HEENT: Normocephalic, atraumatic, pupils equal round reactive to light, neck supple, no masses, no lymphadenopathy, thyroid nonpalpable.  Skin: Warm and dry, no rashes. Cardiac: Regular rate and rhythm, no murmurs rubs or gallops, no lower extremity edema.  Respiratory: Clear to auscultation bilaterally. Not using accessory muscles, speaking in full sentences. Right Knee: Only minimal effusion, only minimal tenderness at the medial joint line. ROM normal in flexion and extension and lower leg rotation. Ligaments with solid consistent endpoints including ACL, PCL, LCL, MCL. Negative Mcmurray's and provocative meniscal tests. Non painful patellar compression. Patellar and quadriceps tendons unremarkable. Hamstring and quadriceps strength is normal.  Impression and Recommendations:    Primary osteoarthritis of right knee Moderate response to injection.  Still has a bit of discomfort when exercising, we will add a reaction knee brace. If insufficient relief with this we will proceed with viscosupplementation.  I spent 25 minutes with this  patient, greater than 50% was face-to-face time counseling regarding the above diagnoses

## 2017-03-05 NOTE — Assessment & Plan Note (Signed)
Moderate response to injection.  Still has a bit of discomfort when exercising, we will add a reaction knee brace. If insufficient relief with this we will proceed with viscosupplementation.

## 2017-03-26 DIAGNOSIS — F429 Obsessive-compulsive disorder, unspecified: Secondary | ICD-10-CM | POA: Diagnosis not present

## 2017-03-26 DIAGNOSIS — I10 Essential (primary) hypertension: Secondary | ICD-10-CM | POA: Diagnosis not present

## 2017-03-26 DIAGNOSIS — F3181 Bipolar II disorder: Secondary | ICD-10-CM | POA: Diagnosis not present

## 2017-04-02 ENCOUNTER — Ambulatory Visit (INDEPENDENT_AMBULATORY_CARE_PROVIDER_SITE_OTHER): Payer: Medicare Other | Admitting: Sports Medicine

## 2017-04-02 ENCOUNTER — Encounter: Payer: Self-pay | Admitting: Sports Medicine

## 2017-04-02 DIAGNOSIS — M1711 Unilateral primary osteoarthritis, right knee: Secondary | ICD-10-CM

## 2017-04-02 NOTE — Assessment & Plan Note (Addendum)
Pain is essentially resolved after injection, oral medication, bracing.  Return as needed. We will consider Visco if persistent pain.

## 2017-04-02 NOTE — Progress Notes (Signed)
  Subjective:    CC: Follow-up  HPI: Right knee osteoarthritis: Pain is resolved after injection, bracing, NSAIDs.  Past medical history:  Negative.  See flowsheet/record as well for more information.  Surgical history: Negative.  See flowsheet/record as well for more information.  Family history: Negative.  See flowsheet/record as well for more information.  Social history: Negative.  See flowsheet/record as well for more information.  Allergies, and medications have been entered into the medical record, reviewed, and no changes needed.   Review of Systems: No fevers, chills, night sweats, weight loss, chest pain, or shortness of breath.   Objective:    General: Well Developed, well nourished, and in no acute distress.  Neuro: Alert and oriented x3, extra-ocular muscles intact, sensation grossly intact.  HEENT: Normocephalic, atraumatic, pupils equal round reactive to light, neck supple, no masses, no lymphadenopathy, thyroid nonpalpable.  Skin: Warm and dry, no rashes. Cardiac: Regular rate and rhythm, no murmurs rubs or gallops, no lower extremity edema.  Respiratory: Clear to auscultation bilaterally. Not using accessory muscles, speaking in full sentences. Right Knee: Minimally swollen Palpation normal with no warmth or joint line tenderness or patellar tenderness or condyle tenderness. ROM normal in flexion and extension and lower leg rotation. Ligaments with solid consistent endpoints including ACL, PCL, LCL, MCL. Negative Mcmurray's and provocative meniscal tests. Non painful patellar compression. Patellar and quadriceps tendons unremarkable. Hamstring and quadriceps strength is normal.  Impression and Recommendations:    Primary osteoarthritis of right knee Pain is essentially resolved after injection, oral medication, bracing.  Return as needed. We will consider Visco if persistent pain.

## 2017-04-05 DIAGNOSIS — I609 Nontraumatic subarachnoid hemorrhage, unspecified: Secondary | ICD-10-CM | POA: Diagnosis not present

## 2017-04-05 DIAGNOSIS — I611 Nontraumatic intracerebral hemorrhage in hemisphere, cortical: Secondary | ICD-10-CM | POA: Diagnosis not present

## 2017-04-05 DIAGNOSIS — F0390 Unspecified dementia without behavioral disturbance: Secondary | ICD-10-CM | POA: Diagnosis not present

## 2017-04-05 DIAGNOSIS — F0150 Vascular dementia without behavioral disturbance: Secondary | ICD-10-CM | POA: Diagnosis not present

## 2017-04-05 DIAGNOSIS — R51 Headache: Secondary | ICD-10-CM | POA: Diagnosis not present

## 2017-06-02 ENCOUNTER — Other Ambulatory Visit: Payer: Self-pay | Admitting: Family Medicine

## 2017-06-20 ENCOUNTER — Ambulatory Visit: Payer: Medicare Other | Admitting: Family Medicine

## 2017-06-27 ENCOUNTER — Ambulatory Visit (INDEPENDENT_AMBULATORY_CARE_PROVIDER_SITE_OTHER): Payer: Medicare Other | Admitting: Family Medicine

## 2017-06-27 ENCOUNTER — Encounter: Payer: Self-pay | Admitting: Family Medicine

## 2017-06-27 VITALS — BP 125/53 | HR 73 | Ht 68.0 in | Wt 172.0 lb

## 2017-06-27 DIAGNOSIS — R319 Hematuria, unspecified: Secondary | ICD-10-CM

## 2017-06-27 DIAGNOSIS — F3170 Bipolar disorder, currently in remission, most recent episode unspecified: Secondary | ICD-10-CM

## 2017-06-27 DIAGNOSIS — M5136 Other intervertebral disc degeneration, lumbar region: Secondary | ICD-10-CM | POA: Insufficient documentation

## 2017-06-27 DIAGNOSIS — R300 Dysuria: Secondary | ICD-10-CM | POA: Diagnosis not present

## 2017-06-27 DIAGNOSIS — F0150 Vascular dementia without behavioral disturbance: Secondary | ICD-10-CM

## 2017-06-27 DIAGNOSIS — Z23 Encounter for immunization: Secondary | ICD-10-CM | POA: Diagnosis not present

## 2017-06-27 DIAGNOSIS — N183 Chronic kidney disease, stage 3 unspecified: Secondary | ICD-10-CM

## 2017-06-27 DIAGNOSIS — Z1159 Encounter for screening for other viral diseases: Secondary | ICD-10-CM

## 2017-06-27 DIAGNOSIS — M51369 Other intervertebral disc degeneration, lumbar region without mention of lumbar back pain or lower extremity pain: Secondary | ICD-10-CM | POA: Insufficient documentation

## 2017-06-27 LAB — POCT URINALYSIS DIPSTICK
Bilirubin, UA: NEGATIVE
GLUCOSE UA: NEGATIVE
KETONES UA: NEGATIVE
Leukocytes, UA: NEGATIVE
Nitrite, UA: NEGATIVE
Protein, UA: NEGATIVE
SPEC GRAV UA: 1.015 (ref 1.010–1.025)
Urobilinogen, UA: 0.2 E.U./dL
pH, UA: 5.5 (ref 5.0–8.0)

## 2017-06-27 NOTE — Progress Notes (Signed)
All labs are normal. 

## 2017-06-27 NOTE — Progress Notes (Signed)
Subjective:    Patient ID: Andres Stevens, male    DOB: 12/30/1945, 71 y.o.   MRN: [id number]  HPI 71 year old male is here today to follow-up on multiple medical problems.  Follow-up vascular dementia -  He is still living at home with his wife. He recently had an MRI of the brain performed in July as noted above. He had been having some daily headaches and with his history of stroke they wanted further evaluation. There were no significant interval changes. His wife has noticed a slight decline. His phsychiatrist has been monitoring this as well.   CKD 3 -  He also reports that he's had some burning with urination for about 2 weeks. No fevers chills or sweats. No blood in the urine. Is not currently taking any medications for his symptoms.  Bipolar disorder w/ OCD - follows with psychiatry at [facility]. Last seen in July.  He also reports that he's had some burning with urination for about 2 weeks. No fevers chills or sweats. No blood in the urine. Is not currently taking any medications for his symptoms.  He also has a little back on the left inner eye area that sometimes feels up with fluid and once was anything we could do about it. Is not really painful or bothersome that he can't wear contacts wants more swollen. His wife reports that he says he only sees better with his classes contacts.  MRI Head WO IV Contrast MRI Head WO IV Contrast  Impressions Performed At  IMPRESSION:  1.No acute intracranial abnormality or interval change.  2.Prior subarachnoid hemorrhage and RIGHT inferior frontal lobe cortical hemorrhage, unchanged.    Electronically Signed by: Bryon Lions  [phone]    MRI Head WO IV Contrast  Narrative Performed At  INDICATION: dementia. Daily headaches, history of stroke. Symptoms for 2 a few weeks.    STUDY: MRI of the head without intravenous contrast performed on 04/05/2017 9:11 AM.    COMPARISON: 03/21/2016 and 08/06/2015.    TECHNIQUE:  Multiplanar, multisequence MR imaging of the brain was obtained without IV contrast.     Contrast: None.    FINDINGS:  #Motion artifact is degrading some of the imaging.  #Skull/marrow/soft tissues: Unremarkable.  #Orbits: Unremarkable.  #Sinuses/mastoid air cells: Mild mucosal inflammatory changes noted in the frontal sinus, ethmoid air cells and sphenoid sinus. The mastoid air cells are patent.  #Vessels: Normal flow voids within the major intracranial vessels.  #Brain: Again noted are areas of susceptibility in the sulci of the LEFT frontal lobe, medial LEFT parietal lobe, RIGHT parietal lobe, and RIGHT frontal lobe, consistent with old subarachnoid hemorrhages. There is also susceptibility in the brain   parenchyma and the RIGHT inferior frontal lobe at the site of the previous known parenchymal hemorrhage. These areas are unchanged. No new abnormality appreciated. No extra-axial fluid collection. No acute infarction is identified. A few punctate foci of  increased T2-weighted signal intensity in the LEFT corona radiata, unchanged. There is no mass effect or midline shift.  #Additional comments: None.    [id number]       Review of Systems     Objective:   Physical Exam  Constitutional: He is oriented to person, place, and time. He appears well-developed and well-nourished.  HENT:  Head: Normocephalic and atraumatic.  Cardiovascular: Normal rate, regular rhythm and normal heart sounds.   Pulmonary/Chest: Effort normal and breath sounds normal.  Neurological: He is alert and oriented to person, place, and time.  Skin: Skin is  warm and dry.  Psychiatric: He has a normal mood and affect. His behavior is normal.         Assessment & Plan:  Vascular dementia - Stable. No recent changes. Wanting to hold off on dementia meds at this time.  Was on them previoulsly.    CKD 3-due to recheck renal function.  Discussed hepatitis C screening. Patient agrees to  have test done.  Dysuria 2 weeks- UA shows blood. Will send for micro and culture.   Swollen bag on the left inner eye-explained that this is just a bit of an aging process. Certainly if he gets into more salt retains fluids, swelling and more. Just recommend cool compresses for treatment but it is not definitive. Will only help temporarily.  Bipolar disorder with OCD-follows every 3 months with psychiatry at [facility].

## 2017-06-28 LAB — BASIC METABOLIC PANEL WITH GFR
BUN: 23 mg/dL (ref 7–25)
CO2: 29 mmol/L (ref 20–32)
CREATININE: 1.18 mg/dL (ref 0.70–1.18)
Calcium: 9.4 mg/dL (ref 8.6–10.3)
Chloride: 105 mmol/L (ref 98–110)
GFR, Est African American: 71 mL/min/{1.73_m2} (ref >=60)
GFR, Est Non African American: 61 mL/min/{1.73_m2} (ref >=60)
GLUCOSE: 100 mg/dL — AB (ref 65–99)
POTASSIUM: 4.3 mmol/L (ref 3.5–5.3)
SODIUM: 139 mmol/L (ref 135–146)

## 2017-06-28 LAB — URINE CULTURE
MICRO NUMBER:: 81098708
Result:: NO GROWTH
SPECIMEN QUALITY:: ADEQUATE

## 2017-06-28 LAB — URINALYSIS, MICROSCOPIC ONLY
BACTERIA UA: NONE SEEN /HPF
HYALINE CAST: NONE SEEN /LPF
RBC / HPF: NONE SEEN /HPF (ref 0–2)
Squamous Epithelial / LPF: NONE SEEN /HPF (ref <=5)
WBC, UA: NONE SEEN /HPF (ref 0–5)

## 2017-06-28 LAB — HEPATITIS C ANTIBODY
Hepatitis C Ab: NONREACTIVE
SIGNAL TO CUT-OFF: 0.01 (ref <1.00)

## 2017-07-26 ENCOUNTER — Other Ambulatory Visit: Payer: Self-pay | Admitting: Family Medicine

## 2017-09-10 DIAGNOSIS — G44209 Tension-type headache, unspecified, not intractable: Secondary | ICD-10-CM | POA: Diagnosis not present

## 2017-09-10 DIAGNOSIS — I10 Essential (primary) hypertension: Secondary | ICD-10-CM | POA: Diagnosis not present

## 2017-09-10 DIAGNOSIS — F0150 Vascular dementia without behavioral disturbance: Secondary | ICD-10-CM | POA: Diagnosis not present

## 2017-10-05 DIAGNOSIS — H2513 Age-related nuclear cataract, bilateral: Secondary | ICD-10-CM | POA: Diagnosis not present

## 2017-10-05 DIAGNOSIS — H53451 Other localized visual field defect, right eye: Secondary | ICD-10-CM | POA: Diagnosis not present

## 2017-11-13 DIAGNOSIS — I10 Essential (primary) hypertension: Secondary | ICD-10-CM | POA: Diagnosis not present

## 2017-11-13 DIAGNOSIS — F319 Bipolar disorder, unspecified: Secondary | ICD-10-CM | POA: Diagnosis not present

## 2017-11-13 DIAGNOSIS — F429 Obsessive-compulsive disorder, unspecified: Secondary | ICD-10-CM | POA: Diagnosis not present

## 2017-12-05 ENCOUNTER — Other Ambulatory Visit: Payer: Self-pay | Admitting: *Deleted

## 2017-12-05 MED ORDER — DOXAZOSIN MESYLATE 1 MG PO TABS: 1.0000 mg | ORAL_TABLET | Freq: Every day | ORAL | 1 refills | Status: DC

## 2017-12-12 DIAGNOSIS — F319 Bipolar disorder, unspecified: Secondary | ICD-10-CM | POA: Diagnosis not present

## 2017-12-12 DIAGNOSIS — F429 Obsessive-compulsive disorder, unspecified: Secondary | ICD-10-CM | POA: Diagnosis not present

## 2017-12-26 ENCOUNTER — Ambulatory Visit: Payer: Medicare Other | Admitting: Family Medicine

## 2018-01-15 ENCOUNTER — Ambulatory Visit (INDEPENDENT_AMBULATORY_CARE_PROVIDER_SITE_OTHER): Payer: Medicare Other | Admitting: Family Medicine

## 2018-01-15 ENCOUNTER — Encounter: Payer: Self-pay | Admitting: Family Medicine

## 2018-01-15 VITALS — BP 122/68 | HR 72 | Ht 68.0 in | Wt 176.0 lb

## 2018-01-15 DIAGNOSIS — E538 Deficiency of other specified B group vitamins: Secondary | ICD-10-CM | POA: Diagnosis not present

## 2018-01-15 DIAGNOSIS — F319 Bipolar disorder, unspecified: Secondary | ICD-10-CM | POA: Diagnosis not present

## 2018-01-15 DIAGNOSIS — N183 Chronic kidney disease, stage 3 unspecified: Secondary | ICD-10-CM

## 2018-01-15 DIAGNOSIS — F0150 Vascular dementia without behavioral disturbance: Secondary | ICD-10-CM | POA: Diagnosis not present

## 2018-01-15 DIAGNOSIS — Z79899 Other long term (current) drug therapy: Secondary | ICD-10-CM | POA: Diagnosis not present

## 2018-01-15 DIAGNOSIS — F3170 Bipolar disorder, currently in remission, most recent episode unspecified: Secondary | ICD-10-CM | POA: Diagnosis not present

## 2018-01-15 DIAGNOSIS — F429 Obsessive-compulsive disorder, unspecified: Secondary | ICD-10-CM | POA: Diagnosis not present

## 2018-01-15 NOTE — Progress Notes (Signed)
Subjective:    Patient ID: Andres Stevens, male    DOB: Jul 26, 1946, 72 y.o.   MRN: [id number]  HPI F/U Vascular dementia -stable overall.  He does follow with psychiatry regularly and they are keeping an eye on his mental status.  He still struggles a lot with his short-term memory but says he can remember things for a long time ago.  He still lives at home with his wife and overall is fairly functional.  He does work out a lot and can work out 4 hours a day.  So his wife has to limit it to some degree.  Though [facility] is [facility]  He also complains of some left knee pain-he thinks he may have injured it while he was visiting in [place].  They were doing a lot of walking and walking uphill and he was exercising on the treadmill.  It is been a little painful and swollen since then.  He has had to have fluid drawn off of it before and he is previously seen Dr. Dianah Field who gave him a knee sleeve.  BiPolar disorder with OCD-he is now working with a therapist in addition to seeing a psychiatrist.  Is been for 2 sessions so far.  His wife is not convinced that it will help because he does have such significant problems with his short-term memory.  F/U CKD 3 -due to recheck renal function.  Review of Systems  BP 122/68   Pulse 72   Ht 5\' 8"  (1.727 m)   Wt 176 lb (79.8 kg)   SpO2 99%   BMI 26.76 kg/m     Allergies  Allergen Reactions  . Bee Venom     Hx of local rxn    Past Medical History:  Diagnosis Date  . Bipolar 1 disorder ([place])   . Wears hearing aid     No past surgical history on file.  Social History   Socioeconomic History  . Marital status: Married    Spouse name: Not on file  . Number of children: Not on file  . Years of education: Not on file  . Highest education level: Not on file  Occupational History  . Not on file  Social Needs  . Financial resource strain: Not on file  . Food insecurity:    Worry: Not on file    Inability: Not  on file  . Transportation needs:    Medical: Not on file    Non-medical: Not on file  Tobacco Use  . Smoking status: Former Smoker    Packs/day: 0.25    Types: Cigarettes    Last attempt to quit: 06/26/2015    Years since quitting: 2.5  . Smokeless tobacco: Never Used  Substance and Sexual Activity  . Alcohol use: Yes    Alcohol/week: 0.0 oz  . Drug use: No  . Sexual activity: Not on file  Lifestyle  . Physical activity:    Days per week: Not on file    Minutes per session: Not on file  . Stress: Not on file  Relationships  . Social connections:    Talks on phone: Not on file    Gets together: Not on file    Attends religious service: Not on file    Active member of club or organization: Not on file    Attends meetings of clubs or organizations: Not on file    Relationship status: Not on file  . Intimate partner  violence:    Fear of current or ex partner: Not on file    Emotionally abused: Not on file    Physically abused: Not on file    Forced sexual activity: Not on file  Other Topics Concern  . Not on file  Social History Narrative  . Not on file    Family History  Problem Relation Age of Onset  . Depression Father     Outpatient Encounter Medications as of 01/15/2018  Medication Sig  . acetaminophen (TYLENOL) 650 MG CR tablet Take 2 tablets (1,300 mg total) by mouth every 8 (eight) hours as needed for pain.  Marland Kitchen atorvastatin (LIPITOR) 40 MG tablet TAKE 1 TABLET BY MOUTH ONCE DAILY  . B Complex-C CAPS Take 1 capsule by mouth daily.  . Cholecalciferol (VITAMIN D) 2000 units tablet Take 4,000 Units by mouth daily.  . DiphenhydrAMINE HCl (ALLERGY MED PO) Take by mouth.  . doxazosin (CARDURA) 1 MG tablet Take 1 tablet (1 mg total) by mouth daily.  Marland Kitchen lamoTRIgine (LAMICTAL) 25 MG tablet Take 75 mg by mouth daily.   No facility-administered encounter medications on file as of 01/15/2018.          Objective:   Physical Exam  Constitutional: He is oriented to  person, place, and time. He appears well-developed and well-nourished.  HENT:  Head: Normocephalic and atraumatic.  Cardiovascular: Normal rate, regular rhythm and normal heart sounds.  Pulmonary/Chest: Effort normal and breath sounds normal.  Neurological: He is alert and oriented to person, place, and time.  Skin: Skin is warm and dry.  Psychiatric: He has a normal mood and affect. His behavior is normal.          Assessment & Plan:  Vascular dementia-continue to monitor.  Many mental status exam of 26/30.  He still wants to hold off on medication specifically for memory.  CKD 3-due to recheck renal function.  B12 deficiency-we will recheck levels.  Bipolar disorder-continue with psychiatry.  They are also monitoring his dementia.

## 2018-01-18 ENCOUNTER — Ambulatory Visit (INDEPENDENT_AMBULATORY_CARE_PROVIDER_SITE_OTHER): Payer: Medicare Other | Admitting: Sports Medicine

## 2018-01-18 ENCOUNTER — Encounter: Payer: Self-pay | Admitting: Sports Medicine

## 2018-01-18 DIAGNOSIS — M1711 Unilateral primary osteoarthritis, right knee: Secondary | ICD-10-CM

## 2018-01-18 NOTE — Assessment & Plan Note (Signed)
Repeat aspiration and injection. Previous injection was approximately 10 months ago. Return as needed.

## 2018-01-18 NOTE — Progress Notes (Signed)
Subjective:    CC: Right knee pain  HPI: This is a pleasant 72 year old male with knee osteoarthritis, over the past few weeks he has had a worsening of pain, swelling, inability to fully straighten the knee, no trauma, constitutional symptoms.  We last did an injection approximately 10 months ago with good relief.  Desires repeat injection today, pain is localized to the joint line.  I reviewed the past medical history, family history, social history, surgical history, and allergies today and no changes were needed.  Please see the problem list section below in epic for further details.  Past Medical History: Past Medical History:  Diagnosis Date  . Bipolar 1 disorder ([place])   . Wears hearing aid    Past Surgical History: No past surgical history on file. Social History: Social History   Socioeconomic History  . Marital status: Married    Spouse name: Not on file  . Number of children: Not on file  . Years of education: Not on file  . Highest education level: Not on file  Occupational History  . Not on file  Social Needs  . Financial resource strain: Not on file  . Food insecurity:    Worry: Not on file    Inability: Not on file  . Transportation needs:    Medical: Not on file    Non-medical: Not on file  Tobacco Use  . Smoking status: Former Smoker    Packs/day: 0.25    Types: Cigarettes    Last attempt to quit: 06/26/2015    Years since quitting: 2.5  . Smokeless tobacco: Never Used  Substance and Sexual Activity  . Alcohol use: Yes    Alcohol/week: 0.0 oz  . Drug use: No  . Sexual activity: Not on file  Lifestyle  . Physical activity:    Days per week: Not on file    Minutes per session: Not on file  . Stress: Not on file  Relationships  . Social connections:    Talks on phone: Not on file    Gets together: Not on file    Attends religious service: Not on file    Active member of club or organization: Not on file    Attends meetings of clubs or  organizations: Not on file    Relationship status: Not on file  Other Topics Concern  . Not on file  Social History Narrative  . Not on file   Family History: Family History  Problem Relation Age of Onset  . Depression Father    Allergies: Allergies  Allergen Reactions  . Bee Venom     Hx of local rxn   Medications: See med rec.  Review of Systems: No fevers, chills, night sweats, weight loss, chest pain, or shortness of breath.   Objective:    General: Well Developed, well nourished, and in no acute distress.  Neuro: Alert and oriented x3, extra-ocular muscles intact, sensation grossly intact.  HEENT: Normocephalic, atraumatic, pupils equal round reactive to light, neck supple, no masses, no lymphadenopathy, thyroid nonpalpable.  Skin: Warm and dry, no rashes. Cardiac: Regular rate and rhythm, no murmurs rubs or gallops, no lower extremity edema.  Respiratory: Clear to auscultation bilaterally. Not using accessory muscles, speaking in full sentences. Right knee:  Tender at the medial joint line, mild effusion. ROM normal in flexion and extension and lower leg rotation. Ligaments with solid consistent endpoints including ACL, PCL, LCL, MCL. Negative Mcmurray's and provocative meniscal tests. Non painful patellar compression. Patellar and quadriceps  tendons unremarkable. Hamstring and quadriceps strength is normal.  Procedure: Real-time Ultrasound Guided aspiration/injection of right knee Device: GE Logiq E  Verbal informed consent obtained.  Time-out conducted.  Noted no overlying erythema, induration, or other signs of local infection.  Skin prepped in a sterile fashion.  Local anesthesia: Topical Ethyl chloride.  With sterile technique and under real time ultrasound guidance: Using an 18-gauge needle advanced into the suprapatellar recess, aspirated approximately 20 cc of clear, straw-colored fluid, syringe switched and 1 cc Kenalog 40, 2 cc lidocaine, 2 cc bupivacaine  injected easily. Completed without difficulty  Pain immediately resolved suggesting accurate placement of the medication.  Advised to call if fevers/chills, erythema, induration, drainage, or persistent bleeding.  Images permanently stored and available for review in the ultrasound unit.  Impression: Technically successful ultrasound guided injection.  Impression and Recommendations:    Primary osteoarthritis of right knee Repeat aspiration and injection. Previous injection was approximately 10 months ago. Return as needed. ___________________________________________ Gwen Her. Dianah Field, M.D., ABFM., CAQSM. Primary Care and [place] Instructor of [place] of [facility] of Medicine

## 2018-02-08 DIAGNOSIS — N183 Chronic kidney disease, stage 3 (moderate): Secondary | ICD-10-CM | POA: Diagnosis not present

## 2018-02-08 DIAGNOSIS — E538 Deficiency of other specified B group vitamins: Secondary | ICD-10-CM | POA: Diagnosis not present

## 2018-02-08 DIAGNOSIS — Z79899 Other long term (current) drug therapy: Secondary | ICD-10-CM | POA: Diagnosis not present

## 2018-02-08 DIAGNOSIS — F0150 Vascular dementia without behavioral disturbance: Secondary | ICD-10-CM | POA: Diagnosis not present

## 2018-02-09 LAB — COMPLETE METABOLIC PANEL WITH GFR
AG Ratio: 2.2 (calc) (ref 1.0–2.5)
ALBUMIN MSPROF: 4.1 g/dL (ref 3.6–5.1)
ALT: 17 U/L (ref 9–46)
AST: 19 U/L (ref 10–35)
Alkaline phosphatase (APISO): 59 U/L (ref 40–115)
BUN/Creatinine Ratio: 17 (calc) (ref 6–22)
BUN: 20 mg/dL (ref 7–25)
CALCIUM: 9.2 mg/dL (ref 8.6–10.3)
CO2: 28 mmol/L (ref 20–32)
CREATININE: 1.2 mg/dL — AB (ref 0.70–1.18)
Chloride: 107 mmol/L (ref 98–110)
GFR, EST NON AFRICAN AMERICAN: 60 mL/min/{1.73_m2} (ref >=60)
GFR, Est African American: 69 mL/min/{1.73_m2} (ref >=60)
GLUCOSE: 101 mg/dL — AB (ref 65–99)
Globulin: 1.9 g/dL (calc) (ref 1.9–3.7)
Potassium: 4.2 mmol/L (ref 3.5–5.3)
Sodium: 140 mmol/L (ref 135–146)
Total Bilirubin: 0.7 mg/dL (ref 0.2–1.2)
Total Protein: 6 g/dL — ABNORMAL LOW (ref 6.1–8.1)

## 2018-02-09 LAB — VITAMIN B12: VITAMIN B 12: 640 pg/mL (ref 200–1100)

## 2018-02-09 LAB — LIPID PANEL
CHOL/HDL RATIO: 2.5 (calc) (ref <5.0)
CHOLESTEROL: 121 mg/dL (ref <200)
HDL: 49 mg/dL (ref >40)
LDL CHOLESTEROL (CALC): 50 mg/dL
NON-HDL CHOLESTEROL (CALC): 72 mg/dL (ref <130)
TRIGLYCERIDES: 140 mg/dL (ref <150)

## 2018-02-09 LAB — TSH: TSH: 1.57 mIU/L (ref 0.40–4.50)

## 2018-02-11 DIAGNOSIS — F0150 Vascular dementia without behavioral disturbance: Secondary | ICD-10-CM | POA: Diagnosis not present

## 2018-02-11 DIAGNOSIS — F319 Bipolar disorder, unspecified: Secondary | ICD-10-CM | POA: Diagnosis not present

## 2018-02-11 DIAGNOSIS — F429 Obsessive-compulsive disorder, unspecified: Secondary | ICD-10-CM | POA: Diagnosis not present

## 2018-02-19 ENCOUNTER — Other Ambulatory Visit: Payer: Self-pay | Admitting: Family Medicine

## 2018-03-12 ENCOUNTER — Ambulatory Visit (INDEPENDENT_AMBULATORY_CARE_PROVIDER_SITE_OTHER): Payer: Medicare Other

## 2018-03-12 ENCOUNTER — Encounter: Payer: Self-pay | Admitting: Sports Medicine

## 2018-03-12 ENCOUNTER — Ambulatory Visit (INDEPENDENT_AMBULATORY_CARE_PROVIDER_SITE_OTHER): Payer: Medicare Other | Admitting: Sports Medicine

## 2018-03-12 DIAGNOSIS — M7989 Other specified soft tissue disorders: Secondary | ICD-10-CM

## 2018-03-12 DIAGNOSIS — M79672 Pain in left foot: Secondary | ICD-10-CM | POA: Insufficient documentation

## 2018-03-12 MED ORDER — PREDNISONE 50 MG PO TABS: ORAL_TABLET | ORAL | 0 refills | Status: DC

## 2018-03-12 NOTE — Progress Notes (Signed)
Subjective:    CC: Left foot swelling  HPI: For the past couple of days this pleasant 72 year old male has noted increasing pain, swelling over the dorsal forefoot, no trauma.  No constitutional symptoms.  Pain is mostly localized over the first MTP as well as the second and third metatarsal shafts.  Severe, persistent.  I reviewed the past medical history, family history, social history, surgical history, and allergies today and no changes were needed.  Please see the problem list section below in epic for further details.  Past Medical History: Past Medical History:  Diagnosis Date  . Bipolar 1 disorder ([place])   . Wears hearing aid    Past Surgical History: No past surgical history on file. Social History: Social History   Socioeconomic History  . Marital status: Married    Spouse name: Not on file  . Number of children: Not on file  . Years of education: Not on file  . Highest education level: Not on file  Occupational History  . Not on file  Social Needs  . Financial resource strain: Not on file  . Food insecurity:    Worry: Not on file    Inability: Not on file  . Transportation needs:    Medical: Not on file    Non-medical: Not on file  Tobacco Use  . Smoking status: Former Smoker    Packs/day: 0.25    Types: Cigarettes    Last attempt to quit: 06/26/2015    Years since quitting: 2.7  . Smokeless tobacco: Never Used  Substance and Sexual Activity  . Alcohol use: Yes    Alcohol/week: 0.0 oz  . Drug use: No  . Sexual activity: Not on file  Lifestyle  . Physical activity:    Days per week: Not on file    Minutes per session: Not on file  . Stress: Not on file  Relationships  . Social connections:    Talks on phone: Not on file    Gets together: Not on file    Attends religious service: Not on file    Active member of club or organization: Not on file    Attends meetings of clubs or organizations: Not on file    Relationship status: Not on file  Other  Topics Concern  . Not on file  Social History Narrative  . Not on file   Family History: Family History  Problem Relation Age of Onset  . Depression Father    Allergies: Allergies  Allergen Reactions  . Bee Venom     Hx of local rxn   Medications: See med rec.  Review of Systems: No fevers, chills, night sweats, weight loss, chest pain, or shortness of breath.   Objective:    General: Well Developed, well nourished, and in no acute distress.  Neuro: Alert and oriented x3, extra-ocular muscles intact, sensation grossly intact.  HEENT: Normocephalic, atraumatic, pupils equal round reactive to light, neck supple, no masses, no lymphadenopathy, thyroid nonpalpable.  Skin: Warm and dry, no rashes. Cardiac: Regular rate and rhythm, no murmurs rubs or gallops, no lower extremity edema.  Respiratory: Clear to auscultation bilaterally. Not using accessory muscles, speaking in full sentences. Left foot: Fusiform swelling over the MTP, distal metatarsals.  Only minimal erythema. Full range of motion. Strength is 5/5 in all directions. No hallux valgus. No pes cavus or pes planus. No abnormal callus noted. No pain over the navicular prominence, or base of fifth metatarsal. No tenderness to palpation of the calcaneal insertion  of plantar fascia. No pain at the Achilles insertion. No pain over the calcaneal bursa. No pain of the retrocalcaneal bursa. No tenderness to palpation over the tarsals, metatarsals, or phalanges. No hallux rigidus or limitus. No tenderness palpation over interphalangeal joints. No pain with compression of the metatarsal heads. Neurovascularly intact distally.  X-rays personally reviewed, negative for fracture, widespread midfoot degenerative changes.  Procedure: Real-time Ultrasound Guided aspiration of left first MTP Device: GE Logiq E  Verbal informed consent obtained.  Time-out conducted.  Noted no overlying erythema, induration, or other signs of  local infection.  Skin prepped in a sterile fashion.  Local anesthesia: Topical Ethyl chloride.  With sterile technique and under real time ultrasound guidance: Advanced a 25-gauge needle into the first MTP, there was an MTP effusion, I aspirated approximately 1 mL of clear, straw-colored fluid.  No injection performed. Completed without difficulty  Pain immediately resolved suggesting accurate placement of the medication.  Advised to call if fevers/chills, erythema, induration, drainage, or persistent bleeding.  Images permanently stored and available for review in the ultrasound unit.  Impression: Technically successful ultrasound guided injection.  Impression and Recommendations:    Acute pain of left foot Acute forefoot pain and swelling. Effusions in the first, second, third MTPs. No fractures on x-ray. Aspiration of the first MTP for crystal analysis, prednisone for 5 days. Return to see me in 2 weeks. ___________________________________________ Gwen Her. Dianah Field, M.D., ABFM., CAQSM. Primary Care and [place] Instructor of [place] of [facility] of Medicine

## 2018-03-12 NOTE — Assessment & Plan Note (Signed)
Acute forefoot pain and swelling. Effusions in the first, second, third MTPs. No fractures on x-ray. Aspiration of the first MTP for crystal analysis, prednisone for 5 days. Return to see me in 2 weeks.

## 2018-03-13 LAB — SYNOVIAL FLUID, CRYSTAL

## 2018-03-19 DIAGNOSIS — F0150 Vascular dementia without behavioral disturbance: Secondary | ICD-10-CM | POA: Diagnosis not present

## 2018-03-26 ENCOUNTER — Ambulatory Visit (INDEPENDENT_AMBULATORY_CARE_PROVIDER_SITE_OTHER): Payer: Medicare Other | Admitting: Sports Medicine

## 2018-03-26 ENCOUNTER — Encounter: Payer: Self-pay | Admitting: Sports Medicine

## 2018-03-26 DIAGNOSIS — M79672 Pain in left foot: Secondary | ICD-10-CM

## 2018-03-26 NOTE — Assessment & Plan Note (Signed)
We aspirated and injected the left first MTP at the last visit, crystal analysis was negative, pain is now resolved. Does have some left lower extremity edema, they will get compression hose.   Return as needed.

## 2018-03-26 NOTE — Progress Notes (Signed)
Subjective:    CC: Recheck foot  HPI: Anthonny returns, we injected his left first MTP at the last visit, returns today pain-free.  I reviewed the past medical history, family history, social history, surgical history, and allergies today and no changes were needed.  Please see the problem list section below in epic for further details.  Past Medical History: Past Medical History:  Diagnosis Date  . Bipolar 1 disorder ([place])   . Wears hearing aid    Past Surgical History: No past surgical history on file. Social History: Social History   Socioeconomic History  . Marital status: Married    Spouse name: Not on file  . Number of children: Not on file  . Years of education: Not on file  . Highest education level: Not on file  Occupational History  . Not on file  Social Needs  . Financial resource strain: Not on file  . Food insecurity:    Worry: Not on file    Inability: Not on file  . Transportation needs:    Medical: Not on file    Non-medical: Not on file  Tobacco Use  . Smoking status: Former Smoker    Packs/day: 0.25    Types: Cigarettes    Last attempt to quit: 06/26/2015    Years since quitting: 2.7  . Smokeless tobacco: Never Used  Substance and Sexual Activity  . Alcohol use: Yes    Alcohol/week: 0.0 oz  . Drug use: No  . Sexual activity: Not on file  Lifestyle  . Physical activity:    Days per week: Not on file    Minutes per session: Not on file  . Stress: Not on file  Relationships  . Social connections:    Talks on phone: Not on file    Gets together: Not on file    Attends religious service: Not on file    Active member of club or organization: Not on file    Attends meetings of clubs or organizations: Not on file    Relationship status: Not on file  Other Topics Concern  . Not on file  Social History Narrative  . Not on file   Family History: Family History  Problem Relation Age of Onset  . Depression Father    Allergies: Allergies    Allergen Reactions  . Bee Venom     Hx of local rxn   Medications: See med rec.  Review of Systems: No fevers, chills, night sweats, weight loss, chest pain, or shortness of breath.   Objective:    General: Well Developed, well nourished, and in no acute distress.  Neuro: Alert and oriented x3, extra-ocular muscles intact, sensation grossly intact.  HEENT: Normocephalic, atraumatic, pupils equal round reactive to light, neck supple, no masses, no lymphadenopathy, thyroid nonpalpable.  Skin: Warm and dry, no rashes. Cardiac: Regular rate and rhythm, no murmurs rubs or gallops, no lower extremity edema.  Respiratory: Clear to auscultation bilaterally. Not using accessory muscles, speaking in full sentences. Left foot: No visible erythema or swelling. Range of motion is full in all directions. Strength is 5/5 in all directions. No hallux valgus. No pes cavus or pes planus. No abnormal callus noted. No pain over the navicular prominence, or base of fifth metatarsal. No tenderness to palpation of the calcaneal insertion of plantar fascia. No pain at the Achilles insertion. No pain over the calcaneal bursa. No pain of the retrocalcaneal bursa. No tenderness to palpation over the tarsals, metatarsals, or phalanges. No hallux  rigidus or limitus. No tenderness palpation over interphalangeal joints. No pain with compression of the metatarsal heads. Neurovascularly intact distally.  Impression and Recommendations:    Acute pain of left foot We aspirated and injected the left first MTP at the last visit, crystal analysis was negative, pain is now resolved. Does have some left lower extremity edema, they will get compression hose.   Return as needed. ___________________________________________ Gwen Her. Dianah Field, M.D., ABFM., CAQSM. Primary Care and [place] Instructor of [place] of [facility] of  Medicine

## 2018-04-12 DIAGNOSIS — F0150 Vascular dementia without behavioral disturbance: Secondary | ICD-10-CM | POA: Diagnosis not present

## 2018-04-12 DIAGNOSIS — F429 Obsessive-compulsive disorder, unspecified: Secondary | ICD-10-CM | POA: Diagnosis not present

## 2018-04-12 DIAGNOSIS — F319 Bipolar disorder, unspecified: Secondary | ICD-10-CM | POA: Diagnosis not present

## 2018-06-03 ENCOUNTER — Other Ambulatory Visit: Payer: Self-pay | Admitting: Family Medicine

## 2018-07-15 DIAGNOSIS — F0150 Vascular dementia without behavioral disturbance: Secondary | ICD-10-CM | POA: Diagnosis not present

## 2018-07-15 DIAGNOSIS — F429 Obsessive-compulsive disorder, unspecified: Secondary | ICD-10-CM | POA: Diagnosis not present

## 2018-07-15 DIAGNOSIS — F319 Bipolar disorder, unspecified: Secondary | ICD-10-CM | POA: Diagnosis not present

## 2018-07-22 ENCOUNTER — Encounter: Payer: Self-pay | Admitting: Family Medicine

## 2018-07-22 ENCOUNTER — Ambulatory Visit (INDEPENDENT_AMBULATORY_CARE_PROVIDER_SITE_OTHER): Payer: Medicare Other | Admitting: Family Medicine

## 2018-07-22 VITALS — BP 126/62 | HR 79 | Ht 68.0 in | Wt 178.0 lb

## 2018-07-22 DIAGNOSIS — N401 Enlarged prostate with lower urinary tract symptoms: Secondary | ICD-10-CM | POA: Diagnosis not present

## 2018-07-22 DIAGNOSIS — N183 Chronic kidney disease, stage 3 unspecified: Secondary | ICD-10-CM

## 2018-07-22 DIAGNOSIS — Z23 Encounter for immunization: Secondary | ICD-10-CM

## 2018-07-22 DIAGNOSIS — Z125 Encounter for screening for malignant neoplasm of prostate: Secondary | ICD-10-CM | POA: Diagnosis not present

## 2018-07-22 DIAGNOSIS — F0150 Vascular dementia without behavioral disturbance: Secondary | ICD-10-CM

## 2018-07-22 DIAGNOSIS — R351 Nocturia: Secondary | ICD-10-CM

## 2018-07-22 MED ORDER — DONEPEZIL HCL 5 MG PO TABS: 5.0000 mg | ORAL_TABLET | Freq: Every day | ORAL | 0 refills | Status: DC

## 2018-07-22 NOTE — Progress Notes (Signed)
Subjective:    Patient ID: Andres Stevens, male    DOB: Sep 24, 1946, 72 y.o.   MRN: [id number]  HPI   72 year old male with a history of vascular dementia comes in today for follow-up for memory loss.  He does follow with psychiatry regularly as he does have a history of bipolar 1 disorder.  He struggles a lot with short-term memory.  He does eat very healthy and exercises daily.  Follow-up CKD 3-we are monitoring his renal function every 6 months.  Is not had any symptoms.  His wife did want to give me an update and let me know that his neurologist, Dr. Dellis Filbert has released him completely after the stroke.  In addition, Dr. Dellis Filbert is actually moving to [place].    His psychiatrist is actually going into private practice and he will be transferring to when the other psychiatrist in the group that he currently follows with.  They will have their first appointment with her in January.  He was previously seeing Dr.Todd Derreberry  Review of Systems  BP 126/62   Pulse 79   Ht 5\' 8"  (1.727 m)   Wt 178 lb (80.7 kg)   SpO2 99%   BMI 27.06 kg/m     Allergies  Allergen Reactions  . Bee Venom     Hx of local rxn    Past Medical History:  Diagnosis Date  . Bipolar 1 disorder ([place])   . Wears hearing aid     No past surgical history on file.  Social History   Socioeconomic History  . Marital status: Married    Spouse name: Not on file  . Number of children: Not on file  . Years of education: Not on file  . Highest education level: Not on file  Occupational History  . Not on file  Social Needs  . Financial resource strain: Not on file  . Food insecurity:    Worry: Not on file    Inability: Not on file  . Transportation needs:    Medical: Not on file    Non-medical: Not on file  Tobacco Use  . Smoking status: Former Smoker    Packs/day: 0.25    Types: Cigarettes    Last attempt to quit: 06/26/2015    Years since quitting: 3.0  . Smokeless tobacco: Never Used  Substance  and Sexual Activity  . Alcohol use: Yes    Alcohol/week: 0.0 standard drinks  . Drug use: No  . Sexual activity: Not on file  Lifestyle  . Physical activity:    Days per week: Not on file    Minutes per session: Not on file  . Stress: Not on file  Relationships  . Social connections:    Talks on phone: Not on file    Gets together: Not on file    Attends religious service: Not on file    Active member of club or organization: Not on file    Attends meetings of clubs or organizations: Not on file    Relationship status: Not on file  . Intimate partner violence:    Fear of current or ex partner: Not on file    Emotionally abused: Not on file    Physically abused: Not on file    Forced sexual activity: Not on file  Other Topics Concern  . Not on file  Social History Narrative  . Not on file    Family History  Problem Relation Age of Onset  . Depression Father  Outpatient Encounter Medications as of 07/22/2018  Medication Sig  . acetaminophen (TYLENOL) 650 MG CR tablet Take 2 tablets (1,300 mg total) by mouth every 8 (eight) hours as needed for pain.  Marland Kitchen atorvastatin (LIPITOR) 40 MG tablet TAKE 1 TABLET BY MOUTH ONCE DAILY  . B Complex-C CAPS Take 1 capsule by mouth daily.  . Cholecalciferol (VITAMIN D) 2000 units tablet Take 4,000 Units by mouth daily.  . DiphenhydrAMINE HCl (ALLERGY MED PO) Take by mouth.  . doxazosin (CARDURA) 1 MG tablet TAKE 1 TABLET BY MOUTH ONCE DAILY  . lamoTRIgine (LAMICTAL) 25 MG tablet Take 75 mg by mouth daily.  . [DISCONTINUED] predniSONE (DELTASONE) 50 MG tablet One tab PO daily for 5 days.  Marland Kitchen donepezil (ARICEPT) 5 MG tablet Take 1 tablet (5 mg total) by mouth at bedtime. We can increase to 10mg  QD after one month.   No facility-administered encounter medications on file as of 07/22/2018.          Objective:   Physical Exam  Constitutional: He is oriented to person, place, and time. He appears well-developed and well-nourished.   HENT:  Head: Normocephalic and atraumatic.  Cardiovascular: Normal rate, regular rhythm and normal heart sounds.  No carotid bruits.   Pulmonary/Chest: Effort normal and breath sounds normal.  Neurological: He is alert and oriented to person, place, and time.  Skin: Skin is warm and dry.  Psychiatric: He has a normal mood and affect. His behavior is normal.          Assessment & Plan:  Vascular dementia-Mini-Mental status score of 26 out of 30 in April.  Now down to 17 out of 30 based on college education Masters degree.  Passing score is 28 based on his age of 25.  We discussed options.  He may or may not respond well to traditional memory medications like Aricept because his dementia is caused by a stroke and not from true Alzheimer's disease.  But we did discuss that they could always consider a trial for couple months just to see if they felt like it was helpful or not.  They said they had had a similar discussion with their psychiatrist recently as well.  His wife and patient opted to try it for couple months.  We did warn about potential side effects will start with 5 mg daily if tolerating well and okay to increase to 10 mg.  They will follow-up with psychiatry in January and let them know if they are doing well with it or not.  Blood pressure is well controlled.  CKD 3-due to recheck renal function.  BPH-currently on doxazosin 1 mg daily.  Due for repeat PSA.

## 2018-08-15 ENCOUNTER — Other Ambulatory Visit: Payer: Self-pay | Admitting: Family Medicine

## 2018-09-19 ENCOUNTER — Ambulatory Visit (INDEPENDENT_AMBULATORY_CARE_PROVIDER_SITE_OTHER): Payer: Medicare Other | Admitting: Osteopathic Medicine

## 2018-09-19 ENCOUNTER — Encounter: Payer: Self-pay | Admitting: Osteopathic Medicine

## 2018-09-19 VITALS — BP 141/62 | HR 68 | Temp 97.4°F | Wt 181.9 lb

## 2018-09-19 DIAGNOSIS — R0602 Shortness of breath: Secondary | ICD-10-CM

## 2018-09-19 DIAGNOSIS — R600 Localized edema: Secondary | ICD-10-CM

## 2018-09-19 DIAGNOSIS — R609 Edema, unspecified: Secondary | ICD-10-CM | POA: Diagnosis not present

## 2018-09-19 MED ORDER — MEDICAL COMPRESSION STOCKINGS MISC: 99 refills | Status: DC

## 2018-09-19 NOTE — Progress Notes (Signed)
HPI: Andres Stevens is a 72 y.o. male who  has a past medical history of Bipolar 1 disorder ([place]) and Wears hearing aid.  he presents to [facility] today, 09/19/18,  for chief complaint of:  Leg redness and swelling  Lower extremity swelling and cramping. Cramping in legs and feet is more bothersome than the swelling. Ongoing for many months. Wearing compression socks but these only come up just past the ankle, not to the knee. Cramping in the feet, usually in evenings/nighttime.   Saw Dr. Darene Lamer. 03/2018 for foot pain, responded well to injection, he also advised compression hose at that point d/t some edema.   Wife reports he is slightly more SOB that usual, no formal exercise, no chest pain.      At today's visit... Past medical history, surgical history, and family history reviewed and updated as needed.  Current medication list and allergy/intolerance information reviewed and updated as needed. (See remainder of HPI, ROS, Phys Exam below)        ASSESSMENT/PLAN: The primary encounter diagnosis was Lower extremity edema. Diagnoses of Dependent edema and SOB (shortness of breath) were also pertinent to this visit.  Exam more consistent w/ dependent edema, no concern for CF or DVT   Orders Placed This Encounter  Procedures  . CBC  . COMPLETE METABOLIC PANEL WITH GFR  . Magnesium  . B Nat Peptide     Meds ordered this encounter  Medications  . Elastic Bandages & Supports (MEDICAL COMPRESSION STOCKINGS) MISC    Sig: Graduated compression. Knee-high. Dispense per patient preference and insurance coverage if applicable.    Dispense:  4 each    Refill:  prn    Patient Instructions  Edema  Edema is an abnormal buildup of fluids in the body tissues and under the skin. Swelling of the legs, feet, and ankles is a common symptom that becomes more likely as you get older. Swelling is also common in looser tissues, like around the eyes. When  the affected area is squeezed, the fluid may move out of that spot and leave a dent for a few moments. This dent is called pitting edema. There are many possible causes of edema. Eating too much salt (sodium) and being on your feet or sitting for a long time can cause edema in your legs, feet, and ankles. Hot weather may make edema worse. Common causes of edema include:  Heart failure.  Liver or kidney disease.  Weak leg blood vessels.  Cancer.  An injury.  Pregnancy.  Medicines.  Being obese.  Low protein levels in the blood. Edema is usually painless. Your skin may look swollen or shiny. Follow these instructions at home:  Keep the affected body part raised (elevated) above the level of your heart when you are sitting or lying down.  Do not sit still or stand for long periods of time.  Do not wear tight clothing. Do not wear garters on your upper legs.  Exercise your legs to get your circulation going. This helps to move the fluid back into your blood vessels, and it may help the swelling go down.  Wear elastic bandages or support stockings to reduce swelling as told by your health care provider.  Eat a low-salt (low-sodium) diet to reduce fluid as told by your health care provider.  Depending on the cause of your swelling, you may need to limit how much fluid you drink (fluid restriction).  Take over-the-counter and prescription medicines only as  told by your health care provider.   "Water pills" typically do not help a great deal for this and can cause issues with your kidneys.  Contact a health care provider if:  Your edema does not get better with treatment.  You have heart, liver, or kidney disease and have symptoms of edema.  You have sudden and unexplained weight gain. Get help right away if:  You develop shortness of breath or chest pain.  You cannot breathe when you lie down.  You develop pain, redness, or warmth in the swollen areas.  You have heart,  liver, or kidney disease and suddenly get edema.  You have a fever and your symptoms suddenly get worse.  This information is not intended to replace advice given to you by your health care provider. Make sure you discuss any questions you have with your health care provider. Document Released: 09/11/2005 Document Revised: 10/14/2016 Document Reviewed: 10/14/2016 Elsevier Interactive Patient Education  2019 [employer].      Follow-up plan: Return if symptoms worsen or fail to improve, please schedule a follow-up with Dr T to discuss orthotics.                             ############################################ ############################################ ############################################ ############################################    Current Meds  Medication Sig  . acetaminophen (TYLENOL) 650 MG CR tablet Take 2 tablets (1,300 mg total) by mouth every 8 (eight) hours as needed for pain.  Marland Kitchen atorvastatin (LIPITOR) 40 MG tablet TAKE 1 TABLET BY MOUTH ONCE DAILY  . B Complex-C CAPS Take 1 capsule by mouth daily.  . Cholecalciferol (VITAMIN D) 2000 units tablet Take 4,000 Units by mouth daily.  . DiphenhydrAMINE HCl (ALLERGY MED PO) Take by mouth.  . donepezil (ARICEPT) 10 MG tablet Take 1 tablet (10 mg total) by mouth at bedtime.  Marland Kitchen doxazosin (CARDURA) 1 MG tablet TAKE 1 TABLET BY MOUTH ONCE DAILY  . lamoTRIgine (LAMICTAL) 25 MG tablet Take 75 mg by mouth daily.    Allergies  Allergen Reactions  . Bee Venom     Hx of local rxn       Review of Systems:  Constitutional: No recent illness  HEENT: No  headache, no vision change  Cardiac: No  chest pain, No  pressure, No palpitations  Respiratory:  +shortness of breath. No  Cough  Gastrointestinal: No  abdominal pain, no change on bowel habits  Musculoskeletal: No new myalgia/arthralgia  Skin: No  Rash  Hem/Onc: No  easy bruising/bleeding, No  abnormal  lumps/bumps  Neurologic: No  weakness, No  Dizziness  Psychiatric: No  concerns with depression, No  concerns with anxiety  Exam:  BP (!) 141/62 (BP Location: Left Arm, Patient Position: Sitting, Cuff Size: Normal)   Pulse 68   Temp (!) 97.4 F (36.3 C) (Oral)   Wt 181 lb 14.4 oz (82.5 kg)   BMI 27.66 kg/m   Constitutional: VS see above. General Appearance: alert, well-developed, well-nourished, NAD  Eyes: Normal lids and conjunctive, non-icteric sclera  Ears, Nose, Mouth, Throat: MMM, Normal external inspection ears/nares/mouth/lips/gums.  Neck: No masses, trachea midline.   Respiratory: Normal respiratory effort. no wheeze, no rhonchi, no rales  Cardiovascular: S1/S2 normal, no murmur, no rub/gallop auscultated. RRR. +1 LE edema bilaterally to mid-tibia, neg Homan's bilaterally   Musculoskeletal: Gait normal. Symmetric and independent movement of all extremities  Neurological: Normal balance/coordination. No tremor.  Skin: warm, dry, intact.   Psychiatric: Normal judgment/insight.  Normal mood and affect. Oriented x3.       Visit summary with medication list and pertinent instructions was printed for patient to review, patient was advised to alert [place] if any updates are needed. All questions at time of visit were answered - patient instructed to contact office with any additional concerns. ER/RTC precautions were reviewed with the patient and understanding verbalized.    Please note: voice recognition software was used to produce this document, and typos may escape review. Please contact Dr. Sheppard Coil for any needed clarifications.    Follow up plan: Return if symptoms worsen or fail to improve, please schedule a follow-up with Dr T to discuss orthotics.

## 2018-09-19 NOTE — Patient Instructions (Signed)
Edema  Edema is an abnormal buildup of fluids in the body tissues and under the skin. Swelling of the legs, feet, and ankles is a common symptom that becomes more likely as you get older. Swelling is also common in looser tissues, like around the eyes. When the affected area is squeezed, the fluid may move out of that spot and leave a dent for a few moments. This dent is called pitting edema. There are many possible causes of edema. Eating too much salt (sodium) and being on your feet or sitting for a long time can cause edema in your legs, feet, and ankles. Hot weather may make edema worse. Common causes of edema include:  Heart failure.  Liver or kidney disease.  Weak leg blood vessels.  Cancer.  An injury.  Pregnancy.  Medicines.  Being obese.  Low protein levels in the blood. Edema is usually painless. Your skin may look swollen or shiny. Follow these instructions at home:  Keep the affected body part raised (elevated) above the level of your heart when you are sitting or lying down.  Do not sit still or stand for long periods of time.  Do not wear tight clothing. Do not wear garters on your upper legs.  Exercise your legs to get your circulation going. This helps to move the fluid back into your blood vessels, and it may help the swelling go down.  Wear elastic bandages or support stockings to reduce swelling as told by your health care provider.  Eat a low-salt (low-sodium) diet to reduce fluid as told by your health care provider.  Depending on the cause of your swelling, you may need to limit how much fluid you drink (fluid restriction).  Take over-the-counter and prescription medicines only as told by your health care provider.   "Water pills" typically do not help a great deal for this and can cause issues with your kidneys.  Contact a health care provider if:  Your edema does not get better with treatment.  You have heart, liver, or kidney disease and have  symptoms of edema.  You have sudden and unexplained weight gain. Get help right away if:  You develop shortness of breath or chest pain.  You cannot breathe when you lie down.  You develop pain, redness, or warmth in the swollen areas.  You have heart, liver, or kidney disease and suddenly get edema.  You have a fever and your symptoms suddenly get worse.  This information is not intended to replace advice given to you by your health care provider. Make sure you discuss any questions you have with your health care provider. Document Released: 09/11/2005 Document Revised: 10/14/2016 Document Reviewed: 10/14/2016 Elsevier Interactive Patient Education  2019 [employer].

## 2018-09-20 LAB — COMPLETE METABOLIC PANEL WITH GFR
AG Ratio: 2.2 (calc) (ref 1.0–2.5)
ALT: 22 U/L (ref 9–46)
AST: 23 U/L (ref 10–35)
Albumin: 4.4 g/dL (ref 3.6–5.1)
Alkaline phosphatase (APISO): 68 U/L (ref 40–115)
BUN / CREAT RATIO: 24 (calc) — AB (ref 6–22)
BUN: 28 mg/dL — AB (ref 7–25)
CO2: 28 mmol/L (ref 20–32)
CREATININE: 1.19 mg/dL — AB (ref 0.70–1.18)
Calcium: 9.4 mg/dL (ref 8.6–10.3)
Chloride: 105 mmol/L (ref 98–110)
GFR, EST AFRICAN AMERICAN: 69 mL/min/{1.73_m2} (ref >=60)
GFR, Est Non African American: 60 mL/min/{1.73_m2} (ref >=60)
GLUCOSE: 78 mg/dL (ref 65–99)
Globulin: 2 g/dL (calc) (ref 1.9–3.7)
Potassium: 4.5 mmol/L (ref 3.5–5.3)
Sodium: 140 mmol/L (ref 135–146)
TOTAL PROTEIN: 6.4 g/dL (ref 6.1–8.1)
Total Bilirubin: 0.5 mg/dL (ref 0.2–1.2)

## 2018-09-20 LAB — BRAIN NATRIURETIC PEPTIDE: BRAIN NATRIURETIC PEPTIDE: 25 pg/mL (ref <100)

## 2018-09-20 LAB — CBC
HEMATOCRIT: 37.1 % — AB (ref 38.5–50.0)
HEMOGLOBIN: 12.9 g/dL — AB (ref 13.2–17.1)
MCH: 31.5 pg (ref 27.0–33.0)
MCHC: 34.8 g/dL (ref 32.0–36.0)
MCV: 90.7 fL (ref 80.0–100.0)
MPV: 9.9 fL (ref 7.5–12.5)
Platelets: 174 10*3/uL (ref 140–400)
RBC: 4.09 10*6/uL — AB (ref 4.20–5.80)
RDW: 13.3 % (ref 11.0–15.0)
WBC: 6.6 10*3/uL (ref 3.8–10.8)

## 2018-09-20 LAB — MAGNESIUM: Magnesium: 2.1 mg/dL (ref 1.5–2.5)

## 2018-10-16 DIAGNOSIS — F429 Obsessive-compulsive disorder, unspecified: Secondary | ICD-10-CM | POA: Diagnosis not present

## 2018-10-16 DIAGNOSIS — F0150 Vascular dementia without behavioral disturbance: Secondary | ICD-10-CM | POA: Diagnosis not present

## 2018-10-16 DIAGNOSIS — F319 Bipolar disorder, unspecified: Secondary | ICD-10-CM | POA: Diagnosis not present

## 2018-11-26 ENCOUNTER — Other Ambulatory Visit: Payer: Self-pay | Admitting: Family Medicine

## 2019-01-21 ENCOUNTER — Ambulatory Visit: Payer: Medicare Other | Admitting: Family Medicine

## 2019-01-29 ENCOUNTER — Ambulatory Visit (INDEPENDENT_AMBULATORY_CARE_PROVIDER_SITE_OTHER): Payer: Medicare Other | Admitting: Sports Medicine

## 2019-01-29 ENCOUNTER — Other Ambulatory Visit: Payer: Self-pay

## 2019-01-29 ENCOUNTER — Ambulatory Visit (INDEPENDENT_AMBULATORY_CARE_PROVIDER_SITE_OTHER): Payer: Medicare Other

## 2019-01-29 ENCOUNTER — Encounter: Payer: Self-pay | Admitting: Sports Medicine

## 2019-01-29 DIAGNOSIS — M79674 Pain in right toe(s): Secondary | ICD-10-CM

## 2019-01-29 DIAGNOSIS — M7989 Other specified soft tissue disorders: Secondary | ICD-10-CM

## 2019-01-29 DIAGNOSIS — M19071 Primary osteoarthritis, right ankle and foot: Secondary | ICD-10-CM | POA: Diagnosis not present

## 2019-01-29 MED ORDER — COLCHICINE 0.6 MG PO TABS: 0.6000 mg | ORAL_TABLET | Freq: Two times a day (BID) | ORAL | 2 refills | Status: DC

## 2019-01-29 NOTE — Assessment & Plan Note (Addendum)
Acutely hot painful swollen great toe, no fluid for arthrocentesis, injection performed. Checking CBC, CMP, uric acid levels. Adding colchicine, hold off on indomethacin, history of hemorrhagic stroke, return to see me in 2 weeks. Renal function did look okay back in December.  Uric acid levels are normal.  At the followup visit we will probabaly check the levels again, sometimes the blood uric acid levels can decrease during flares as it precipitates into the joint.

## 2019-01-29 NOTE — Progress Notes (Addendum)
Subjective:    CC: Right foot swelling  HPI: Over the past several days this pleasant 73 year old male has developed severe redness, heat, swelling in his right great toe at the MTP.  No trauma.  Symptoms are severe, persistent, localized without radiation.  His wife gave him a few indomethacin, and he is here for further evaluation and definitive treatment.  He does have a history of a hemorrhagic stroke.  I reviewed the past medical history, family history, social history, surgical history, and allergies today and no changes were needed.  Please see the problem list section below in epic for further details.  Past Medical History: Past Medical History:  Diagnosis Date  . Bipolar 1 disorder ([place])   . Wears hearing aid    Past Surgical History: No past surgical history on file. Social History: Social History   Socioeconomic History  . Marital status: Married    Spouse name: Not on file  . Number of children: Not on file  . Years of education: Not on file  . Highest education level: Not on file  Occupational History  . Not on file  Social Needs  . Financial resource strain: Not on file  . Food insecurity:    Worry: Not on file    Inability: Not on file  . Transportation needs:    Medical: Not on file    Non-medical: Not on file  Tobacco Use  . Smoking status: Former Smoker    Packs/day: 0.25    Types: Cigarettes    Last attempt to quit: 06/26/2015    Years since quitting: 3.6  . Smokeless tobacco: Never Used  Substance and Sexual Activity  . Alcohol use: Yes    Alcohol/week: 0.0 standard drinks  . Drug use: No  . Sexual activity: Not on file  Lifestyle  . Physical activity:    Days per week: Not on file    Minutes per session: Not on file  . Stress: Not on file  Relationships  . Social connections:    Talks on phone: Not on file    Gets together: Not on file    Attends religious service: Not on file    Active member of club or organization: Not on file   Attends meetings of clubs or organizations: Not on file    Relationship status: Not on file  Other Topics Concern  . Not on file  Social History Narrative  . Not on file   Family History: Family History  Problem Relation Age of Onset  . Depression Father    Allergies: Allergies  Allergen Reactions  . Bee Venom Anaphylaxis    Hx of local rxn    Medications: See med rec.  Review of Systems: No fevers, chills, night sweats, weight loss, chest pain, or shortness of breath.   Objective:    General: Well Developed, well nourished, and in no acute distress.  Neuro: Alert and oriented x3, extra-ocular muscles intact, sensation grossly intact.  HEENT: Normocephalic, atraumatic, pupils equal round reactive to light, neck supple, no masses, no lymphadenopathy, thyroid nonpalpable.  Skin: Warm and dry, no rashes. Cardiac: Regular rate and rhythm, no murmurs rubs or gallops, no lower extremity edema.  Respiratory: Clear to auscultation bilaterally. Not using accessory muscles, speaking in full sentences. Right foot: Severely swollen, red MTP, swelling radiates up to the proximal midfoot. Range of motion is full in all directions. Strength is 5/5 in all directions. No hallux valgus. No pes cavus or pes planus. No abnormal callus  noted. No pain over the navicular prominence, or base of fifth metatarsal. No tenderness to palpation of the calcaneal insertion of plantar fascia. No pain at the Achilles insertion. No pain over the calcaneal bursa. No pain of the retrocalcaneal bursa. No tenderness to palpation over the tarsals, metatarsals, or phalanges. No hallux rigidus or limitus. No tenderness palpation over interphalangeal joints. No pain with compression of the metatarsal heads. Neurovascularly intact distally.  Procedure: Real-time Ultrasound Guided injection of the right first MTP Device: GE Logiq E  Verbal informed consent obtained.  Time-out conducted.  Noted no overlying  erythema, induration, or other signs of local infection.  Skin prepped in a sterile fashion.  Local anesthesia: Topical Ethyl chloride.  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 Completed without difficulty  Pain immediately resolved suggesting accurate placement of the medication.  Advised to call if fevers/chills, erythema, induration, drainage, or persistent bleeding.  Images permanently stored and available for review in the ultrasound unit.  Impression: Technically successful ultrasound guided injection.  X-rays personally reviewed, there is significant MTP arthritis, no visible fractures.  Impression and Recommendations:    Pain and swelling of toe, right Acutely hot painful swollen great toe, no fluid for arthrocentesis, injection performed. Checking CBC, CMP, uric acid levels. Adding colchicine, hold off on indomethacin, history of hemorrhagic stroke, return to see me in 2 weeks. Renal function did look okay back in December.  Uric acid levels are normal.  At the followup visit we will probabaly check the levels again, sometimes the blood uric acid levels can decrease during flares as it precipitates into the joint.   ___________________________________________ Gwen Her. Dianah Field, M.D., ABFM., CAQSM. Primary Care and Sports Medicine [facility]  Adjunct Professor of [place] [employer] [facility] [employer]

## 2019-01-30 LAB — COMPLETE METABOLIC PANEL WITH GFR
AG Ratio: 2.3 (calc) (ref 1.0–2.5)
ALT: 16 U/L (ref 9–46)
AST: 19 U/L (ref 10–35)
Albumin: 4.5 g/dL (ref 3.6–5.1)
Alkaline phosphatase (APISO): 79 U/L (ref 35–144)
BUN/Creatinine Ratio: 21 (calc) (ref 6–22)
BUN: 27 mg/dL — ABNORMAL HIGH (ref 7–25)
CO2: 25 mmol/L (ref 20–32)
Calcium: 9.4 mg/dL (ref 8.6–10.3)
Chloride: 105 mmol/L (ref 98–110)
Creat: 1.27 mg/dL — ABNORMAL HIGH (ref 0.70–1.18)
GFR, Est African American: 64 mL/min/{1.73_m2} (ref >=60)
GFR, Est Non African American: 55 mL/min/{1.73_m2} — ABNORMAL LOW (ref >=60)
Globulin: 2 g/dL (calc) (ref 1.9–3.7)
Glucose, Bld: 88 mg/dL (ref 65–99)
Potassium: 5 mmol/L (ref 3.5–5.3)
Sodium: 138 mmol/L (ref 135–146)
Total Bilirubin: 0.7 mg/dL (ref 0.2–1.2)
Total Protein: 6.5 g/dL (ref 6.1–8.1)

## 2019-01-30 LAB — URIC ACID: Uric Acid, Serum: 6.3 mg/dL (ref 4.0–8.0)

## 2019-01-30 LAB — CBC
HCT: 36.7 % — ABNORMAL LOW (ref 38.5–50.0)
Hemoglobin: 13 g/dL — ABNORMAL LOW (ref 13.2–17.1)
MCH: 31.6 pg (ref 27.0–33.0)
MCHC: 35.4 g/dL (ref 32.0–36.0)
MCV: 89.3 fL (ref 80.0–100.0)
MPV: 9.8 fL (ref 7.5–12.5)
Platelets: 172 10*3/uL (ref 140–400)
RBC: 4.11 10*6/uL — ABNORMAL LOW (ref 4.20–5.80)
RDW: 13.3 % (ref 11.0–15.0)
WBC: 8.1 10*3/uL (ref 3.8–10.8)

## 2019-02-05 DIAGNOSIS — F0150 Vascular dementia without behavioral disturbance: Secondary | ICD-10-CM | POA: Diagnosis not present

## 2019-02-05 DIAGNOSIS — F3111 Bipolar disorder, current episode manic without psychotic features, mild: Secondary | ICD-10-CM | POA: Diagnosis not present

## 2019-02-12 ENCOUNTER — Encounter: Payer: Self-pay | Admitting: Sports Medicine

## 2019-02-12 ENCOUNTER — Ambulatory Visit (INDEPENDENT_AMBULATORY_CARE_PROVIDER_SITE_OTHER): Payer: Medicare Other | Admitting: Sports Medicine

## 2019-02-12 DIAGNOSIS — M79674 Pain in right toe(s): Secondary | ICD-10-CM

## 2019-02-12 DIAGNOSIS — M7989 Other specified soft tissue disorders: Secondary | ICD-10-CM

## 2019-02-12 NOTE — Progress Notes (Addendum)
Subjective:    CC: Left foot swelling  HPI: This is a pleasant 73 year old male, I saw him previously with severe left foot redness, swelling, heat.  We suspected gout.  There was no fluid for arthrocentesis, we injected the joint and it resolved in a couple days.  His uric acid levels at the time were in the sixes, but not elevated.  He has improved considerably, pain is mostly gone.  X-rays did show osteoarthritis.  I reviewed the past medical history, family history, social history, surgical history, and allergies today and no changes were needed.  Please see the problem list section below in epic for further details.  Past Medical History: Past Medical History:  Diagnosis Date  . Bipolar 1 disorder ([place])   . Wears hearing aid    Past Surgical History: No past surgical history on file. Social History: Social History   Socioeconomic History  . Marital status: Married    Spouse name: Not on file  . Number of children: Not on file  . Years of education: Not on file  . Highest education level: Not on file  Occupational History  . Not on file  Social Needs  . Financial resource strain: Not on file  . Food insecurity:    Worry: Not on file    Inability: Not on file  . Transportation needs:    Medical: Not on file    Non-medical: Not on file  Tobacco Use  . Smoking status: Former Smoker    Packs/day: 0.25    Types: Cigarettes    Last attempt to quit: 06/26/2015    Years since quitting: 3.6  . Smokeless tobacco: Never Used  Substance and Sexual Activity  . Alcohol use: Yes    Alcohol/week: 0.0 standard drinks  . Drug use: No  . Sexual activity: Not on file  Lifestyle  . Physical activity:    Days per week: Not on file    Minutes per session: Not on file  . Stress: Not on file  Relationships  . Social connections:    Talks on phone: Not on file    Gets together: Not on file    Attends religious service: Not on file    Active member of club or organization: Not on  file    Attends meetings of clubs or organizations: Not on file    Relationship status: Not on file  Other Topics Concern  . Not on file  Social History Narrative  . Not on file   Family History: Family History  Problem Relation Age of Onset  . Depression Father    Allergies: Allergies  Allergen Reactions  . Bee Venom Anaphylaxis    Hx of local rxn    Medications: See med rec.  Review of Systems: No fevers, chills, night sweats, weight loss, chest pain, or shortness of breath.   Objective:    General: Well Developed, well nourished, and in no acute distress.  Neuro: Alert and oriented x3, extra-ocular muscles intact, sensation grossly intact.  HEENT: Normocephalic, atraumatic, pupils equal round reactive to light, neck supple, no masses, no lymphadenopathy, thyroid nonpalpable.  Skin: Warm and dry, no rashes. Cardiac: Regular rate and rhythm, no murmurs rubs or gallops, no lower extremity edema.  Respiratory: Clear to auscultation bilaterally. Not using accessory muscles, speaking in full sentences. Left foot: Only minimal erythema, swelling has for the most part resolved, only minimal tenderness over the medial aspect of the first MTP Range of motion is full in all directions.  Strength is 5/5 in all directions. No hallux valgus. No pes cavus or pes planus. No abnormal callus noted. No pain over the navicular prominence, or base of fifth metatarsal. No tenderness to palpation of the calcaneal insertion of plantar fascia. No pain at the Achilles insertion. No pain over the calcaneal bursa. No pain of the retrocalcaneal bursa. No tenderness to palpation over the tarsals, metatarsals, or phalanges. No hallux rigidus or limitus. No tenderness palpation over interphalangeal joints. No pain with compression of the metatarsal heads. Neurovascularly intact distally.  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 For the most part resolved today.We are going to check them again today. Hold off on colchicine, indomethacin, we can add allopurinol if uric acid levels are elevated  Uric acid has increased to 6.5, I am going to add a low-dose of allopurinol to be taken daily.  This is not in the usual range of 7-9 that we see in gout patients but it is higher than the gout target of less than 5.   ___________________________________________ Gwen Her. Dianah Field, M.D., ABFM., CAQSM. Primary Care and Sports Medicine [facility]  Adjunct Professor of [employer]

## 2019-02-12 NOTE — Assessment & Plan Note (Addendum)
Initially suspected crystalline arthropathy, uric acid levels were in the sixes during a flare,, however this may simply be run-of-the-mill osteoarthritis. For the most part resolved today.We are going to check them again today. Hold off on colchicine, indomethacin, we can add allopurinol if uric acid levels are elevated  Uric acid has increased to 6.5, I am going to add a low-dose of allopurinol to be taken daily.  This is not in the usual range of 7-9 that we see in gout patients but it is higher than the gout target of less than 5.

## 2019-02-13 LAB — COMPLETE METABOLIC PANEL WITH GFR
AG Ratio: 2.3 (calc) (ref 1.0–2.5)
ALT: 16 U/L (ref 9–46)
AST: 18 U/L (ref 10–35)
Albumin: 4.4 g/dL (ref 3.6–5.1)
Alkaline phosphatase (APISO): 66 U/L (ref 35–144)
BUN/Creatinine Ratio: 21 (calc) (ref 6–22)
BUN: 27 mg/dL — ABNORMAL HIGH (ref 7–25)
CO2: 27 mmol/L (ref 20–32)
Calcium: 9.2 mg/dL (ref 8.6–10.3)
Chloride: 106 mmol/L (ref 98–110)
Creat: 1.27 mg/dL — ABNORMAL HIGH (ref 0.70–1.18)
GFR, Est African American: 64 mL/min/{1.73_m2} (ref >=60)
GFR, Est Non African American: 55 mL/min/{1.73_m2} — ABNORMAL LOW (ref >=60)
Globulin: 1.9 g/dL (calc) (ref 1.9–3.7)
Glucose, Bld: 69 mg/dL (ref 65–99)
Potassium: 4.1 mmol/L (ref 3.5–5.3)
Sodium: 140 mmol/L (ref 135–146)
Total Bilirubin: 0.5 mg/dL (ref 0.2–1.2)
Total Protein: 6.3 g/dL (ref 6.1–8.1)

## 2019-02-13 LAB — URIC ACID: Uric Acid, Serum: 6.5 mg/dL (ref 4.0–8.0)

## 2019-02-13 MED ORDER — ALLOPURINOL 300 MG PO TABS: 300.0000 mg | ORAL_TABLET | Freq: Every day | ORAL | 6 refills | Status: DC

## 2019-02-13 NOTE — Addendum Note (Signed)
Addended by: Silverio Decamp on: 02/13/2019 09:02 AM   Modules accepted: Orders

## 2019-02-14 ENCOUNTER — Ambulatory Visit (INDEPENDENT_AMBULATORY_CARE_PROVIDER_SITE_OTHER): Payer: Medicare Other | Admitting: Family Medicine

## 2019-02-14 ENCOUNTER — Encounter: Payer: Self-pay | Admitting: Family Medicine

## 2019-02-14 VITALS — BP 123/73 | HR 69 | Ht 68.0 in | Wt 175.0 lb

## 2019-02-14 DIAGNOSIS — R0602 Shortness of breath: Secondary | ICD-10-CM

## 2019-02-14 DIAGNOSIS — I10 Essential (primary) hypertension: Secondary | ICD-10-CM | POA: Diagnosis not present

## 2019-02-14 DIAGNOSIS — R232 Flushing: Secondary | ICD-10-CM

## 2019-02-14 DIAGNOSIS — N183 Chronic kidney disease, stage 3 unspecified: Secondary | ICD-10-CM

## 2019-02-14 DIAGNOSIS — Z125 Encounter for screening for malignant neoplasm of prostate: Secondary | ICD-10-CM

## 2019-02-14 DIAGNOSIS — Z8673 Personal history of transient ischemic attack (TIA), and cerebral infarction without residual deficits: Secondary | ICD-10-CM | POA: Diagnosis not present

## 2019-02-14 NOTE — Assessment & Plan Note (Signed)
Due to recheck renal function.  Reminded him that we will try to keep an eye on this every 6 months.

## 2019-02-14 NOTE — Assessment & Plan Note (Signed)
Suspect that he could have some early COPD.  He started smoking in his 84s and says he smoked for almost 30 years until he had a stroke and since then will have an occasional cigar maybe every 3 months.  Like to schedule him for spirometry here in the office for further work-up sometime this summer.

## 2019-02-14 NOTE — Assessment & Plan Note (Signed)
Continue with statin daily.  Due to recheck lipid levels.

## 2019-02-14 NOTE — Progress Notes (Signed)
Established Patient Office Visit  Subjective:  Patient ID: Andres Stevens, male    DOB: 18-Jul-1946  Age: 73 y.o. MRN: [id number]  CC:  Chief Complaint  Patient presents with  . Follow-up    HPI Andres Stevens presents for 54-month follow-up.  F/U CKD 3 -   no recent changes to urination or difficulties.  Hx of stroke - due for lipids check.  He reports that he is taking statin regularly without any side effects or problems.  He is not had any recent symptoms.  Did want let me know that the pain and swelling in his feet have been much better since he started tart cherry juice as well as a combination of vinegar and turmeric.  He is also taking the allopurinol.  He also reports episodes of his blood pressure shooting up.  Normally he has great blood pressure but his wife says he is had about 4 episodes where his blood pressure shot up into the 170s and he felt very flushed in the face.  He reports that he was completely asymptomatic when this would happen.  She says the last time it happened it lasted a couple of hours and his blood pressure got up to 176/90 it finally just resolved on its own.  He denies any chest pain shortness of breath or headaches when this happens or feeling like his temperature fluctuates.  His wife reports that his face does look red.  Reports getting a little bit of short of breath with activities.  He feels like he is noticed it more in the last year than he did a year or 2 ago.  He walks pretty regularly around his neighborhood and more recently around [place] like with his wife.  She says even she has noticed it.  Again he does not experience any chest pain with it she just noticed that he gets out of breath a little bit more easily.  Past Medical History:  Diagnosis Date  . Bipolar 1 disorder ([place])   . Wears hearing aid     History reviewed. No pertinent surgical history.  Family History  Problem Relation Age of Onset  . Depression Father      Social History   Socioeconomic History  . Marital status: Married    Spouse name: Not on file  . Number of children: Not on file  . Years of education: Not on file  . Highest education level: Not on file  Occupational History  . Not on file  Social Needs  . Financial resource strain: Not on file  . Food insecurity:    Worry: Not on file    Inability: Not on file  . Transportation needs:    Medical: Not on file    Non-medical: Not on file  Tobacco Use  . Smoking status: Former Smoker    Packs/day: 0.25    Types: Cigarettes    Last attempt to quit: 06/26/2015    Years since quitting: 3.6  . Smokeless tobacco: Never Used  Substance and Sexual Activity  . Alcohol use: Yes    Alcohol/week: 0.0 standard drinks  . Drug use: No  . Sexual activity: Not on file  Lifestyle  . Physical activity:    Days per week: Not on file    Minutes per session: Not on file  . Stress: Not on file  Relationships  . Social connections:    Talks on phone: Not on file    Gets together: Not on file  Attends religious service: Not on file    Active member of club or organization: Not on file    Attends meetings of clubs or organizations: Not on file    Relationship status: Not on file  . Intimate partner violence:    Fear of current or ex partner: Not on file    Emotionally abused: Not on file    Physically abused: Not on file    Forced sexual activity: Not on file  Other Topics Concern  . Not on file  Social History Narrative  . Not on file    Outpatient Medications Prior to Visit  Medication Sig Dispense Refill  . acetaminophen (TYLENOL) 650 MG CR tablet Take 2 tablets (1,300 mg total) by mouth every 8 (eight) hours as needed for pain. 90 tablet 3  . allopurinol (ZYLOPRIM) 300 MG tablet Take 1 tablet (300 mg total) by mouth daily. 30 tablet 6  . atorvastatin (LIPITOR) 40 MG tablet TAKE 1 TABLET BY MOUTH ONCE DAILY 90 tablet 3  . B Complex-C CAPS Take 1 capsule by mouth daily.     . Cholecalciferol 125 MCG (5000 UT) capsule Take 10,000 Units by mouth daily.    . DiphenhydrAMINE HCl (ALLERGY MED PO) Take by mouth.    . doxazosin (CARDURA) 1 MG tablet Take 1 tablet by mouth once daily 90 tablet 0  . lamoTRIgine (LAMICTAL) 100 MG tablet Take 100 mg by mouth at bedtime.    . lamoTRIgine (LAMICTAL) 25 MG tablet Take 1 tablet by mouth every morning.    . Cholecalciferol (VITAMIN D) 2000 units tablet Take 4,000 Units by mouth daily.    Regino Schultze Bandages & Supports (MEDICAL COMPRESSION STOCKINGS) MISC Graduated compression. Knee-high. Dispense per patient preference and insurance coverage if applicable. 4 each prn  . lamoTRIgine (LAMICTAL) 25 MG tablet Take 75 mg by mouth daily.     No facility-administered medications prior to visit.     Allergies  Allergen Reactions  . Bee Venom Anaphylaxis    Hx of local rxn     ROS Review of Systems    Objective:    Physical Exam  BP 123/73   Pulse 69   Ht 5\' 8"  (1.727 m)   Wt 175 lb (79.4 kg)   SpO2 99%   BMI 26.61 kg/m  Wt Readings from Last 3 Encounters:  02/14/19 175 lb (79.4 kg)  02/12/19 177 lb (80.3 kg)  09/19/18 181 lb 14.4 oz (82.5 kg)     Health Maintenance Due  Topic Date Due  . COLONOSCOPY  09/26/2015    There are no preventive care reminders to display for this patient.  Lab Results  Component Value Date   TSH 1.57 02/08/2018   Lab Results  Component Value Date   WBC 8.1 01/29/2019   HGB 13.0 (L) 01/29/2019   HCT 36.7 (L) 01/29/2019   MCV 89.3 01/29/2019   PLT 172 01/29/2019   Lab Results  Component Value Date   NA 140 02/12/2019   K 4.1 02/12/2019   CO2 27 02/12/2019   GLUCOSE 69 02/12/2019   BUN 27 (H) 02/12/2019   CREATININE 1.27 (H) 02/12/2019   BILITOT 0.5 02/12/2019   ALKPHOS 59 10/23/2016   AST 18 02/12/2019   ALT 16 02/12/2019   PROT 6.3 02/12/2019   ALBUMIN 4.2 10/23/2016   CALCIUM 9.2 02/12/2019   Lab Results  Component Value Date   CHOL 121 02/08/2018   Lab  Results  Component Value Date   HDL  49 02/08/2018   Lab Results  Component Value Date   LDLCALC 50 02/08/2018   Lab Results  Component Value Date   TRIG 140 02/08/2018   Lab Results  Component Value Date   CHOLHDL 2.5 02/08/2018   Lab Results  Component Value Date   HGBA1C 4.2 12/24/2013      Assessment & Plan:   Problem List Items Addressed This Visit      Genitourinary   CKD (chronic kidney disease) stage 3, GFR 30-59 ml/min ([place]) - Primary    Due to recheck renal function.  Reminded him that we will try to keep an eye on this every 6 months.      Relevant Orders   TSH   Catecholamines, fractionated, Urine, 24 hour   Metanephrines, Urine, 24 hour   Catecholamines, Fractionated, Plasma     Other   SOB (shortness of breath)    Suspect that he could have some early COPD.  He started smoking in his 83s and says he smoked for almost 30 years until he had a stroke and since then will have an occasional cigar maybe every 3 months.  Like to schedule him for spirometry here in the office for further work-up sometime this summer.      History of stroke    Continue with statin daily.  Due to recheck lipid levels.       Other Visit Diagnoses    Flushing       Relevant Orders   TSH   Catecholamines, fractionated, Urine, 24 hour   Metanephrines, Urine, 24 hour   Catecholamines, Fractionated, Plasma   Hypertension, unspecified type       Relevant Orders   TSH   Catecholamines, fractionated, Urine, 24 hour   Metanephrines, Urine, 24 hour   Catecholamines, Fractionated, Plasma   Screening for prostate cancer       Relevant Orders   PSA      Episodes of flushing and elevated blood pressure with no underlying baseline of hypertension-evaluate for possible pheochromocytoma.  It is encouraged his wife to try to keep track of how often this happens and write those blood pressures down.  Consider further work-up if initial work-up is negative.    No orders of the  defined types were placed in this encounter.   Follow-up: Return in about 6 months (around 08/17/2019) for BP.    Beatrice Lecher, MD

## 2019-02-14 NOTE — Patient Instructions (Signed)
Remember to get your Tdap at the pharmacy this year.

## 2019-02-18 DIAGNOSIS — I10 Essential (primary) hypertension: Secondary | ICD-10-CM | POA: Diagnosis not present

## 2019-02-18 DIAGNOSIS — Z125 Encounter for screening for malignant neoplasm of prostate: Secondary | ICD-10-CM | POA: Diagnosis not present

## 2019-02-18 DIAGNOSIS — N183 Chronic kidney disease, stage 3 (moderate): Secondary | ICD-10-CM | POA: Diagnosis not present

## 2019-02-18 DIAGNOSIS — R232 Flushing: Secondary | ICD-10-CM | POA: Diagnosis not present

## 2019-02-18 LAB — TSH: TSH: 1.58 (ref 0.41–5.90)

## 2019-02-23 ENCOUNTER — Other Ambulatory Visit: Payer: Self-pay | Admitting: Family Medicine

## 2019-02-23 LAB — CATECHOLAMINES, FRACTIONATED, URINE, 24 HOUR
Calculated Total (E+NE): 25 mcg/24 h — ABNORMAL LOW (ref 26–121)
Creatinine, Urine mg/day-CATEUR: 1.22 g/(24.h) (ref 0.50–2.15)
Dopamine, 24 hr Urine: 153 mcg/24 h (ref 52–480)
Norepinephrine, 24 hr Ur: 25 mcg/24 h (ref 15–100)
Volume, Urine-VMAUR: 1500 mL

## 2019-02-23 LAB — TSH: TSH: 1.58 mIU/L (ref 0.40–4.50)

## 2019-02-23 LAB — METANEPHRINES, URINE, 24 HOUR
Metaneph Total, Ur: 629 mcg/24 h (ref 224–832)
Metanephrines, Ur: 134 mcg/24 h (ref 90–315)
Normetanephrine, 24H Ur: 495 mcg/24 h (ref 122–676)
Volume, Urine-VMAUR: 1500 mL

## 2019-02-23 LAB — CATECHOLAMINES, FRACTIONATED, PLASMA
Dopamine: 16 pg/mL
Epinephrine: <20 pg/mL
Norepinephrine: 789 pg/mL
Total Catecholamines: 805 pg/mL

## 2019-02-23 LAB — PSA: PSA: 0.3 ng/mL (ref <=4.0)

## 2019-03-15 ENCOUNTER — Other Ambulatory Visit: Payer: Self-pay | Admitting: Family Medicine

## 2019-03-29 ENCOUNTER — Emergency Department (INDEPENDENT_AMBULATORY_CARE_PROVIDER_SITE_OTHER)
Admission: EM | Admit: 2019-03-29 | Discharge: 2019-03-29 | Disposition: A | Discharge status: Home / Self Care | Payer: Medicare Other | Source: Home / Self Care | Attending: Family Medicine | Admitting: Family Medicine

## 2019-03-29 ENCOUNTER — Other Ambulatory Visit: Payer: Self-pay

## 2019-03-29 DIAGNOSIS — L255 Unspecified contact dermatitis due to plants, except food: Secondary | ICD-10-CM

## 2019-03-29 DIAGNOSIS — L03818 Cellulitis of other sites: Secondary | ICD-10-CM | POA: Diagnosis not present

## 2019-03-29 MED ORDER — PREDNISONE 20 MG PO TABS: ORAL_TABLET | ORAL | 0 refills | Status: DC

## 2019-03-29 MED ORDER — DOXYCYCLINE HYCLATE 100 MG PO CAPS: 100.0000 mg | ORAL_CAPSULE | Freq: Two times a day (BID) | ORAL | 0 refills | Status: DC

## 2019-03-29 MED ORDER — METHYLPREDNISOLONE SODIUM SUCC 125 MG IJ SOLR
80.0000 mg | Freq: Once | INTRAMUSCULAR | Status: AC
  Administered 2019-03-29: 80 mg via INTRAMUSCULAR

## 2019-03-29 NOTE — ED Triage Notes (Signed)
Cut up a tree branch on Monday, rash developed Tuesday.  Has had benadryl, oral, and spray.

## 2019-03-29 NOTE — Discharge Instructions (Addendum)
Begin prednisone Sunday, March 30, 2019. May take Benadryl as needed for itching.

## 2019-03-29 NOTE — ED Provider Notes (Signed)
[facility]    CSN: [id number] Arrival date & time: 03/29/19  0950     History   Chief Complaint Chief Complaint  Patient presents with  . Rash    HPI Andres Stevens is a 73 y.o. male.   Patient cut up a tree branch six days ago.  He subsequently developed a pruritic erythematous rash on his arms and lower extremities.  The rash has become more erythematous and mildly painful.  He has had chills but no fever.  The history is provided by the patient.  Poison [name] This is a new problem. Episode onset: 5 days ago. The problem occurs constantly. The problem has been gradually worsening. Associated symptoms comments: chills. Nothing aggravates the symptoms. Nothing relieves the symptoms. Treatments tried: Benadryl and topical itch spray. The treatment provided no relief.    Past Medical History:  Diagnosis Date  . Bipolar 1 disorder ([place])   . Wears hearing aid     Patient Active Problem List   Diagnosis Date Noted  . SOB (shortness of breath) 02/14/2019  . Pain and swelling of toe, right with mild hyperuricemia 01/29/2019  . Acute pain of left foot 03/12/2018  . Degenerative disc disease, lumbar 06/27/2017  . Primary osteoarthritis of right knee 02/01/2017  . CKD (chronic kidney disease) stage 3, GFR 30-59 ml/min ([facility]) 10/24/2016  . Family history of dementia 04/16/2016  . History of stroke 10/08/2015  . History of basal cell carcinoma 10/22/2014  . B12 deficiency 12/24/2013  . Thyroid cyst 10/15/2012  . Vascular dementia without behavioral disturbance ([place]) 05/13/2012  . HYPOGONADISM 07/13/2009  . Bipolar disorder ([place]) 05/26/2009  . BPH associated with nocturia 05/26/2009  . 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 PEYRONIE'S DISEASE 05/26/2009    History reviewed. No pertinent surgical history.     Home Medications    Prior to Admission medications   Medication Sig Start Date End Date Taking? Authorizing Provider  acetaminophen (TYLENOL) 650  MG CR tablet Take 2 tablets (1,300 mg total) by mouth every 8 (eight) hours as needed for pain. 02/01/17   Silverio Decamp, MD  allopurinol (ZYLOPRIM) 300 MG tablet Take 1 tablet (300 mg total) by mouth daily. 02/13/19   Silverio Decamp, MD  atorvastatin (LIPITOR) 40 MG tablet Take 1 tablet by mouth once daily 03/17/19   Hali Marry, MD  B Complex-C CAPS Take 1 capsule by mouth daily.    [provider]  Cholecalciferol 125 MCG (5000 UT) capsule Take 10,000 Units by mouth daily.    [provider]  DiphenhydrAMINE HCl (ALLERGY MED PO) Take by mouth.    [provider]  doxazosin (CARDURA) 1 MG tablet Take 1 tablet by mouth once daily 02/24/19   Hali Marry, MD  doxycycline (VIBRAMYCIN) 100 MG capsule Take 1 capsule (100 mg total) by mouth 2 (two) times daily. Take with food. 03/29/19   Kandra Nicolas, MD  lamoTRIgine (LAMICTAL) 100 MG tablet Take 100 mg by mouth at bedtime. 12/11/18   [provider]  lamoTRIgine (LAMICTAL) 25 MG tablet Take 1 tablet by mouth every morning. 02/05/19   [provider]  predniSONE (DELTASONE) 20 MG tablet Take one tab by mouth twice daily for 4 days, then one daily for 3 days. Take with food. 03/29/19   Kandra Nicolas, MD    Family History Family History  Problem Relation Age of Onset  . Depression Father     Social History Social History  Tobacco Use  . Smoking status: Former Smoker    Packs/day: 0.25    Types: Cigarettes    Quit date: 06/26/2015    Years since quitting: 3.7  . Smokeless tobacco: Never Used  Substance Use Topics  . Alcohol use: Yes    Alcohol/week: 0.0 standard drinks  . Drug use: No     Allergies   Bee venom   Review of Systems Review of Systems  All other systems reviewed and are negative.    Physical Exam Triage Vital Signs ED Triage Vitals  Enc Vitals Group     BP 03/29/19 1007 128/76     Pulse Rate 03/29/19 1007 72     Resp 03/29/19 1007 20      Temp 03/29/19 1007 (!) 97 F (36.1 C)     Temp Source 03/29/19 1007 Tympanic     SpO2 03/29/19 1007 98 %     Weight 03/29/19 1008 174 lb (78.9 kg)     Height 03/29/19 1008 5\' 8"  (1.727 m)     Head Circumference --      Peak Flow --      Pain Score 03/29/19 1007 0     Pain Loc --      Pain Edu? --      Excl. in [place]? --    No data found.  Updated Vital Signs BP 128/76 (BP Location: Right Arm)   Pulse 72   Temp (!) 97 F (36.1 C) (Tympanic)   Resp 20   Ht 5\' 8"  (1.727 m)   Wt 78.9 kg   SpO2 98%   BMI 26.46 kg/m   Visual Acuity Right Eye Distance:   Left Eye Distance:   Bilateral Distance:    Right Eye Near:   Left Eye Near:    Bilateral Near:     Physical Exam Vitals signs and nursing note reviewed.  Constitutional:      General: He is not in acute distress. HENT:     Head: Normocephalic.     Right Ear: External ear normal.     Left Ear: External ear normal.     Nose: Nose normal.     Mouth/Throat:     Pharynx: Oropharynx is clear.  Eyes:     Conjunctiva/sclera: Conjunctivae normal.     Pupils: Pupils are equal, round, and reactive to light.  Cardiovascular:     Rate and Rhythm: Normal rate.  Pulmonary:     Effort: Pulmonary effort is normal.  Musculoskeletal:        General: No swelling.     Right lower leg: No edema.     Left lower leg: No edema.  Lymphadenopathy:     Cervical: No cervical adenopathy.  Skin:    General: Skin is warm and dry.     Findings: Rash present.          Comments: Macular erythematous eruption on extremities as noted on diagram.  No warmth, induration, or fluctuance.  Neurological:     Mental Status: He is alert.      UC Treatments / Results  Labs (all labs ordered are listed, but only abnormal results are displayed) Labs Reviewed - No data to display  EKG   Radiology No results found.  Procedures Procedures (including critical care time)  Medications Ordered in UC Medications  methylPREDNISolone sodium  succinate (SOLU-MEDROL) 125 mg/2 mL injection 80 mg (80 mg Intramuscular Given 03/29/19 1101)    Initial Impression / Assessment and Plan / UC Course  I have reviewed the triage vital signs and the nursing notes.  Pertinent labs & imaging results that were available during my care of the patient were reviewed by me and considered in my medical decision making (see chart for details).    Administered Solumedrol 80mg  IM.  Begin prednisone burst/taper.  Begin doxycycline for staph coverage. Followup with Family Doctor if not improved in one week.    Final Clinical Impressions(s) / UC Diagnoses   Final diagnoses:  Rhus dermatitis  Cellulitis of other specified site     Discharge Instructions     Begin prednisone Sunday, March 30, 2019. May take Benadryl as needed for itching.    ED Prescriptions    Medication Sig Dispense Auth. Provider   doxycycline (VIBRAMYCIN) 100 MG capsule Take 1 capsule (100 mg total) by mouth 2 (two) times daily. Take with food. 14 capsule Kandra Nicolas, MD   predniSONE (DELTASONE) 20 MG tablet Take one tab by mouth twice daily for 4 days, then one daily for 3 days. Take with food. 11 tablet Kandra Nicolas, MD        Kandra Nicolas, MD 04/01/19 [phone]

## 2019-05-12 DIAGNOSIS — F3111 Bipolar disorder, current episode manic without psychotic features, mild: Secondary | ICD-10-CM | POA: Diagnosis not present

## 2019-05-12 DIAGNOSIS — F0151 Vascular dementia with behavioral disturbance: Secondary | ICD-10-CM | POA: Diagnosis not present

## 2019-05-19 ENCOUNTER — Ambulatory Visit (INDEPENDENT_AMBULATORY_CARE_PROVIDER_SITE_OTHER): Payer: Medicare Other

## 2019-05-19 ENCOUNTER — Other Ambulatory Visit: Payer: Self-pay

## 2019-05-19 ENCOUNTER — Ambulatory Visit (INDEPENDENT_AMBULATORY_CARE_PROVIDER_SITE_OTHER): Payer: Medicare Other | Admitting: Family Medicine

## 2019-05-19 VITALS — BP 130/63 | HR 73 | Ht 68.0 in | Wt 173.0 lb

## 2019-05-19 DIAGNOSIS — Z23 Encounter for immunization: Secondary | ICD-10-CM | POA: Diagnosis not present

## 2019-05-19 DIAGNOSIS — Z77090 Contact with and (suspected) exposure to asbestos: Secondary | ICD-10-CM | POA: Insufficient documentation

## 2019-05-19 DIAGNOSIS — R0602 Shortness of breath: Secondary | ICD-10-CM | POA: Diagnosis not present

## 2019-05-19 MED ORDER — ALBUTEROL SULFATE HFA 108 (90 BASE) MCG/ACT IN AERS
4.0000 | INHALATION_SPRAY | Freq: Once | RESPIRATORY_TRACT | Status: AC
  Administered 2019-05-19: 4 via RESPIRATORY_TRACT

## 2019-05-19 NOTE — Progress Notes (Signed)
Established Patient Office Visit  Subjective:  Patient ID: Andres Stevens, male    DOB: Nov 01, 1945  Age: 73 y.o. MRN: [id number]  CC:  Chief Complaint  Patient presents with  . Shortness of Breath    HPI Andres Stevens presents for spirometry for further evaluation for shortness of breath.  Noted when I saw him back in May that he was getting occasional shortness of breath with activities.  He felt like he noticed it more in the last year.  Though he walks pretty regularly for exercise.  He denies any chest pain with it.  He did have a history of asbestos exposure through work. Past Medical History:  Diagnosis Date  . Bipolar 1 disorder ([place])   . Wears hearing aid     History reviewed. No pertinent surgical history.  Family History  Problem Relation Age of Onset  . Depression Father     Social History   Socioeconomic History  . Marital status: Married    Spouse name: Not on file  . Number of children: Not on file  . Years of education: Not on file  . Highest education level: Not on file  Occupational History  . Not on file  Social Needs  . Financial resource strain: Not on file  . Food insecurity    Worry: Not on file    Inability: Not on file  . Transportation needs    Medical: Not on file    Non-medical: Not on file  Tobacco Use  . Smoking status: Former Smoker    Packs/day: 0.25    Types: Cigarettes    Quit date: 06/26/2015    Years since quitting: 3.8  . Smokeless tobacco: Never Used  Substance and Sexual Activity  . Alcohol use: Yes    Alcohol/week: 0.0 standard drinks  . Drug use: No  . Sexual activity: Not on file  Lifestyle  . Physical activity    Days per week: Not on file    Minutes per session: Not on file  . Stress: Not on file  Relationships  . Social Herbalist on phone: Not on file    Gets together: Not on file    Attends religious service: Not on file    Active member of club or organization: Not on file    Attends  meetings of clubs or organizations: Not on file    Relationship status: Not on file  . Intimate partner violence    Fear of current or ex partner: Not on file    Emotionally abused: Not on file    Physically abused: Not on file    Forced sexual activity: Not on file  Other Topics Concern  . Not on file  Social History Narrative  . Not on file    Outpatient Medications Prior to Visit  Medication Sig Dispense Refill  . acetaminophen (TYLENOL) 650 MG CR tablet Take 2 tablets (1,300 mg total) by mouth every 8 (eight) hours as needed for pain. 90 tablet 3  . allopurinol (ZYLOPRIM) 300 MG tablet Take 1 tablet (300 mg total) by mouth daily. 30 tablet 6  . atorvastatin (LIPITOR) 40 MG tablet Take 1 tablet by mouth once daily 90 tablet 3  . B Complex-C CAPS Take 1 capsule by mouth daily.    . Cholecalciferol 125 MCG (5000 UT) capsule Take 10,000 Units by mouth daily.    . DiphenhydrAMINE HCl (ALLERGY MED PO) Take by mouth.    . doxazosin (CARDURA) 1 MG  tablet Take 1 tablet by mouth once daily 90 tablet 1  . lamoTRIgine (LAMICTAL) 100 MG tablet Take 100 mg by mouth at bedtime.    . lamoTRIgine (LAMICTAL) 25 MG tablet Take 1 tablet by mouth every morning.    Marland Kitchen doxycycline (VIBRAMYCIN) 100 MG capsule Take 1 capsule (100 mg total) by mouth 2 (two) times daily. Take with food. 14 capsule 0  . predniSONE (DELTASONE) 20 MG tablet Take one tab by mouth twice daily for 4 days, then one daily for 3 days. Take with food. 11 tablet 0   No facility-administered medications prior to visit.     Allergies  Allergen Reactions  . Bee Venom Anaphylaxis    Hx of local rxn     ROS Review of Systems    Objective:    Physical Exam  Constitutional: He is oriented to person, place, and time. He appears well-developed and well-nourished.  HENT:  Head: Normocephalic and atraumatic.  Cardiovascular: Normal rate, regular rhythm and normal heart sounds.  Pulmonary/Chest: Effort normal and breath sounds  normal.  Neurological: He is alert and oriented to person, place, and time.  Skin: Skin is warm and dry.  Psychiatric: He has a normal mood and affect. His behavior is normal.    BP 130/63   Pulse 73   Ht 5\' 8"  (1.727 m)   Wt 173 lb (78.5 kg)   SpO2 100%   BMI 26.30 kg/m  Wt Readings from Last 3 Encounters:  05/19/19 173 lb (78.5 kg)  03/29/19 174 lb (78.9 kg)  02/14/19 175 lb (79.4 kg)     Health Maintenance Due  Topic Date Due  . COLONOSCOPY  09/26/2015  . INFLUENZA VACCINE  04/26/2019    There are no preventive care reminders to display for this patient.  Lab Results  Component Value Date   TSH 1.58 02/18/2019   Lab Results  Component Value Date   WBC 8.1 01/29/2019   HGB 13.0 (L) 01/29/2019   HCT 36.7 (L) 01/29/2019   MCV 89.3 01/29/2019   PLT 172 01/29/2019   Lab Results  Component Value Date   NA 140 02/12/2019   K 4.1 02/12/2019   CO2 27 02/12/2019   GLUCOSE 69 02/12/2019   BUN 27 (H) 02/12/2019   CREATININE 1.27 (H) 02/12/2019   BILITOT 0.5 02/12/2019   ALKPHOS 59 10/23/2016   AST 18 02/12/2019   ALT 16 02/12/2019   PROT 6.3 02/12/2019   ALBUMIN 4.2 10/23/2016   CALCIUM 9.2 02/12/2019   Lab Results  Component Value Date   CHOL 121 02/08/2018   Lab Results  Component Value Date   HDL 49 02/08/2018   Lab Results  Component Value Date   LDLCALC 50 02/08/2018   Lab Results  Component Value Date   TRIG 140 02/08/2018   Lab Results  Component Value Date   CHOLHDL 2.5 02/08/2018   Lab Results  Component Value Date   HGBA1C 4.2 12/24/2013      Assessment & Plan:   Problem List Items Addressed This Visit      Other   SOB (shortness of breath) - Primary   Relevant Orders   DG Chest 2 View (Completed)   History of exposure to asbestos    Other Visit Diagnoses    Need for immunization against influenza       Relevant Orders   Flu Vaccine QUAD High Dose(Fluad) (Completed)     Spirometry performed today.  Reviewed results  with him and  his wife today.  FVC of 94% FEV1 of 101%.  Though he had somewhat poor maneuvers.  He did have a good smooth curve there was a little bit up-and-down irregularity.  No significant improvement after albuterol in fact he actually get a little bit worse.  Reviewed those results today.  Will get chest x-ray for further work-up.  We discussed making sure getting regular exercise to maintain his stamina.  Meds ordered this encounter  Medications  . albuterol (VENTOLIN HFA) 108 (90 Base) MCG/ACT inhaler 4 puff    Follow-up: Return in about 3 months (around 08/19/2019) for Hypertension and kidney check up.    Beatrice Lecher, MD

## 2019-06-30 NOTE — Addendum Note (Signed)
Addended by: Alena Bills R on: 06/30/2019 02:51 PM   Modules accepted: Orders

## 2019-08-12 ENCOUNTER — Other Ambulatory Visit: Payer: Self-pay | Admitting: Sports Medicine

## 2019-08-12 DIAGNOSIS — M79674 Pain in right toe(s): Secondary | ICD-10-CM

## 2019-08-12 MED ORDER — ALLOPURINOL 300 MG PO TABS: 300.0000 mg | ORAL_TABLET | Freq: Every day | ORAL | 1 refills | Status: DC

## 2019-08-13 DIAGNOSIS — F0150 Vascular dementia without behavioral disturbance: Secondary | ICD-10-CM | POA: Diagnosis not present

## 2019-08-13 DIAGNOSIS — F3111 Bipolar disorder, current episode manic without psychotic features, mild: Secondary | ICD-10-CM | POA: Diagnosis not present

## 2019-08-18 ENCOUNTER — Ambulatory Visit: Payer: Medicare Other | Admitting: Family Medicine

## 2019-08-18 NOTE — Progress Notes (Deleted)
Established Patient Office Visit  Subjective:  Patient ID: Andres Stevens, male    DOB: [birth year]  Age: 73 y.o. MRN: [id number]  CC: No chief complaint on file.   HPI Andres Stevens presents for   Follow-up CKD 3.  Past Medical History:  Diagnosis Date  . Bipolar 1 disorder ([place])   . Wears hearing aid     No past surgical history on file.  Family History  Problem Relation Age of Onset  . Depression Father     Social History   Socioeconomic History  . Marital status: Married    Spouse name: Not on file  . Number of children: Not on file  . Years of education: Not on file  . Highest education level: Not on file  Occupational History  . Not on file  Social Needs  . Financial resource strain: Not on file  . Food insecurity    Worry: Not on file    Inability: Not on file  . Transportation needs    Medical: Not on file    Non-medical: Not on file  Tobacco Use  . Smoking status: Former Smoker    Packs/day: 0.25    Types: Cigarettes    Quit date: 06/26/2015    Years since quitting: 4.1  . Smokeless tobacco: Never Used  Substance and Sexual Activity  . Alcohol use: Yes    Alcohol/week: 0.0 standard drinks  . Drug use: No  . Sexual activity: Not on file  Lifestyle  . Physical activity    Days per week: Not on file    Minutes per session: Not on file  . Stress: Not on file  Relationships  . Social Herbalist on phone: Not on file    Gets together: Not on file    Attends religious service: Not on file    Active member of club or organization: Not on file    Attends meetings of clubs or organizations: Not on file    Relationship status: Not on file  . Intimate partner violence    Fear of current or ex partner: Not on file    Emotionally abused: Not on file    Physically abused: Not on file    Forced sexual activity: Not on file  Other Topics Concern  . Not on file  Social History Narrative  . Not on file    Outpatient Medications  Prior to Visit  Medication Sig Dispense Refill  . acetaminophen (TYLENOL) 650 MG CR tablet Take 2 tablets (1,300 mg total) by mouth every 8 (eight) hours as needed for pain. 90 tablet 3  . allopurinol (ZYLOPRIM) 300 MG tablet Take 1 tablet (300 mg total) by mouth daily. 90 tablet 1  . atorvastatin (LIPITOR) 40 MG tablet Take 1 tablet by mouth once daily 90 tablet 3  . B Complex-C CAPS Take 1 capsule by mouth daily.    . Cholecalciferol 125 MCG (5000 UT) capsule Take 10,000 Units by mouth daily.    . DiphenhydrAMINE HCl (ALLERGY MED PO) Take by mouth.    . doxazosin (CARDURA) 1 MG tablet Take 1 tablet by mouth once daily 90 tablet 1  . lamoTRIgine (LAMICTAL) 100 MG tablet Take 100 mg by mouth at bedtime.    . lamoTRIgine (LAMICTAL) 25 MG tablet Take 1 tablet by mouth every morning.     No facility-administered medications prior to visit.     Allergies  Allergen Reactions  . Bee Venom Anaphylaxis    Hx  of local rxn     ROS Review of Systems    Objective:    Physical Exam  There were no vitals taken for this visit. Wt Readings from Last 3 Encounters:  05/19/19 173 lb (78.5 kg)  03/29/19 174 lb (78.9 kg)  02/14/19 175 lb (79.4 kg)     Health Maintenance Due  Topic Date Due  . COLONOSCOPY  09/26/2015  . TETANUS/TDAP  07/14/2019    There are no preventive care reminders to display for this patient.  Lab Results  Component Value Date   TSH 1.58 02/18/2019   Lab Results  Component Value Date   WBC 8.1 01/29/2019   HGB 13.0 (L) 01/29/2019   HCT 36.7 (L) 01/29/2019   MCV 89.3 01/29/2019   PLT 172 01/29/2019   Lab Results  Component Value Date   NA 140 02/12/2019   K 4.1 02/12/2019   CO2 27 02/12/2019   GLUCOSE 69 02/12/2019   BUN 27 (H) 02/12/2019   CREATININE 1.27 (H) 02/12/2019   BILITOT 0.5 02/12/2019   ALKPHOS 59 10/23/2016   AST 18 02/12/2019   ALT 16 02/12/2019   PROT 6.3 02/12/2019   ALBUMIN 4.2 10/23/2016   CALCIUM 9.2 02/12/2019   Lab Results   Component Value Date   CHOL 121 02/08/2018   Lab Results  Component Value Date   HDL 49 02/08/2018   Lab Results  Component Value Date   LDLCALC 50 02/08/2018   Lab Results  Component Value Date   TRIG 140 02/08/2018   Lab Results  Component Value Date   CHOLHDL 2.5 02/08/2018   Lab Results  Component Value Date   HGBA1C 4.2 12/24/2013      Assessment & Plan:   Problem List Items Addressed This Visit      Nervous and Auditory   Vascular dementia without behavioral disturbance (HCC) - Primary     Genitourinary   CKD (chronic kidney disease) stage 3, GFR 30-59 ml/min      No orders of the defined types were placed in this encounter.   Follow-up: No follow-ups on file.    Beatrice Lecher, MD

## 2019-08-19 ENCOUNTER — Ambulatory Visit (INDEPENDENT_AMBULATORY_CARE_PROVIDER_SITE_OTHER): Payer: Medicare Other

## 2019-08-19 ENCOUNTER — Ambulatory Visit (INDEPENDENT_AMBULATORY_CARE_PROVIDER_SITE_OTHER): Payer: Medicare Other | Admitting: Family Medicine

## 2019-08-19 ENCOUNTER — Other Ambulatory Visit: Payer: Self-pay

## 2019-08-19 ENCOUNTER — Encounter: Payer: Self-pay | Admitting: Family Medicine

## 2019-08-19 VITALS — BP 128/61 | HR 74 | Ht 68.0 in | Wt 169.0 lb

## 2019-08-19 DIAGNOSIS — N401 Enlarged prostate with lower urinary tract symptoms: Secondary | ICD-10-CM

## 2019-08-19 DIAGNOSIS — Z23 Encounter for immunization: Secondary | ICD-10-CM

## 2019-08-19 DIAGNOSIS — M545 Low back pain, unspecified: Secondary | ICD-10-CM

## 2019-08-19 DIAGNOSIS — R351 Nocturia: Secondary | ICD-10-CM

## 2019-08-19 DIAGNOSIS — G8929 Other chronic pain: Secondary | ICD-10-CM | POA: Diagnosis not present

## 2019-08-19 DIAGNOSIS — N1831 Chronic kidney disease, stage 3a: Secondary | ICD-10-CM

## 2019-08-19 DIAGNOSIS — F0150 Vascular dementia without behavioral disturbance: Secondary | ICD-10-CM

## 2019-08-19 DIAGNOSIS — M542 Cervicalgia: Secondary | ICD-10-CM | POA: Diagnosis not present

## 2019-08-19 DIAGNOSIS — Z8673 Personal history of transient ischemic attack (TIA), and cerebral infarction without residual deficits: Secondary | ICD-10-CM

## 2019-08-19 DIAGNOSIS — Z13220 Encounter for screening for lipoid disorders: Secondary | ICD-10-CM | POA: Diagnosis not present

## 2019-08-19 DIAGNOSIS — M1711 Unilateral primary osteoarthritis, right knee: Secondary | ICD-10-CM | POA: Diagnosis not present

## 2019-08-19 DIAGNOSIS — M5030 Other cervical disc degeneration, unspecified cervical region: Secondary | ICD-10-CM | POA: Insufficient documentation

## 2019-08-19 MED ORDER — DOXAZOSIN MESYLATE 1 MG PO TABS: 1.0000 mg | ORAL_TABLET | Freq: Every day | ORAL | 1 refills | Status: DC

## 2019-08-19 MED ORDER — TETANUS-DIPHTH-ACELL PERTUSSIS 5-2.5-18.5 LF-MCG/0.5 IM SUSP: 0.5000 mL | Freq: Once | INTRAMUSCULAR | 0 refills | Status: AC

## 2019-08-19 NOTE — Assessment & Plan Note (Signed)
Suspect osteoarthritis with degenerative disc.  No radicular symptoms currently.  Again usually aggravated by heavy lifting encouraged him not to do heavy lifting if at all.  Just encouraged him to ask for help if needed.  Okay to use Tylenol and gentle stretches.  Right now the pain is not debilitating or interfering with daily activity.

## 2019-08-19 NOTE — Assessment & Plan Note (Signed)
To recheck renal function.  Following every 6 months.

## 2019-08-19 NOTE — Progress Notes (Signed)
Established Patient Office Visit  Subjective:  Patient ID: Andres Stevens, male    DOB: 01/17/1946  Age: 72 y.o. MRN: [id number]  CC:  Chief Complaint  Patient presents with  . Hypertension    HPI Andres Stevens presents for  CKD 3 -doing well no specific concerns.  Due for repeat renal function. We are checking q 12mo.   He is doing well overall but was speaking with his psychologist is concerned bc he is having problems with his back, right knee and neck.  Has seen Dr. Darene Lamer about his right knee.  He is actually had problems for the last several years.  Most the time he takes Tylenol and that does seem to help.  He was last seen April 2019 at the time he received an injection into the right knee and did well with this.  He had an injection 10 months prior to that.  Tylenol does help his pain.  He says his low back pain has also been going on for years.  He denies any known trauma or injury its mostly been low back he rarely has any type of radicular symptoms it is across the midline sometimes he feels like the right is a little worse than the left.  He says it seems to be aggravated more with heavy lifting.  He says it has never been evaluated by a provider before.  Also complains of upper neck pain.  He says he notices it most when he flexes or if he rotates to the right in particular.  Again no old injuries or trauma that he is aware of.  He just notes that sometimes when he sleeps he gets into an awkward position with his neck and it becomes painful.  Past Medical History:  Diagnosis Date  . Bipolar 1 disorder ([place])   . Wears hearing aid     History reviewed. No pertinent surgical history.  Family History  Problem Relation Age of Onset  . Depression Father     Social History   Socioeconomic History  . Marital status: Married    Spouse name: Not on file  . Number of children: Not on file  . Years of education: Not on file  . Highest education level: Not on file   Occupational History  . Not on file  Social Needs  . Financial resource strain: Not on file  . Food insecurity    Worry: Not on file    Inability: Not on file  . Transportation needs    Medical: Not on file    Non-medical: Not on file  Tobacco Use  . Smoking status: Former Smoker    Packs/day: 0.25    Types: Cigarettes    Quit date: 06/26/2015    Years since quitting: 4.1  . Smokeless tobacco: Never Used  Substance and Sexual Activity  . Alcohol use: Yes    Alcohol/week: 0.0 standard drinks  . Drug use: No  . Sexual activity: Not on file  Lifestyle  . Physical activity    Days per week: Not on file    Minutes per session: Not on file  . Stress: Not on file  Relationships  . Social Herbalist on phone: Not on file    Gets together: Not on file    Attends religious service: Not on file    Active member of club or organization: Not on file    Attends meetings of clubs or organizations: Not on file  Relationship status: Not on file  . Intimate partner violence    Fear of current or ex partner: Not on file    Emotionally abused: Not on file    Physically abused: Not on file    Forced sexual activity: Not on file  Other Topics Concern  . Not on file  Social History Narrative  . Not on file    Outpatient Medications Prior to Visit  Medication Sig Dispense Refill  . lamoTRIgine (LAMICTAL) 25 MG tablet Take 50 mg by mouth every morning.    Marland Kitchen acetaminophen (TYLENOL) 650 MG CR tablet Take 2 tablets (1,300 mg total) by mouth every 8 (eight) hours as needed for pain. 90 tablet 3  . allopurinol (ZYLOPRIM) 300 MG tablet Take 1 tablet (300 mg total) by mouth daily. 90 tablet 1  . atorvastatin (LIPITOR) 40 MG tablet Take 1 tablet by mouth once daily 90 tablet 3  . B Complex-C CAPS Take 1 capsule by mouth daily.    . Cholecalciferol 125 MCG (5000 UT) capsule Take 10,000 Units by mouth daily.    . DiphenhydrAMINE HCl (ALLERGY MED PO) Take by mouth.    . lamoTRIgine  (LAMICTAL) 100 MG tablet Take 100 mg by mouth at bedtime.    Marland Kitchen doxazosin (CARDURA) 1 MG tablet Take 1 tablet by mouth once daily 90 tablet 1  . lamoTRIgine (LAMICTAL) 25 MG tablet Take 1 tablet by mouth every morning.     No facility-administered medications prior to visit.     Allergies  Allergen Reactions  . Bee Venom Anaphylaxis    Hx of local rxn     ROS Review of Systems    Objective:    Physical Exam  Constitutional: He is oriented to person, place, and time. He appears well-developed and well-nourished.  HENT:  Head: Normocephalic and atraumatic.  Cardiovascular: Normal rate, regular rhythm and normal heart sounds.  Pulmonary/Chest: Effort normal and breath sounds normal.  Musculoskeletal:     Comments: Normal cervical flexion, significantly decreased extension.  Also decreased rotation right and left but somewhat symmetric.  Decreased sidebending but symmetric.  Lumbar spine with normal flexion and extension.  Fairly normal rotation right and left.  Decreased sidebending to the left compared to the right.  Neurological: He is alert and oriented to person, place, and time.  Skin: Skin is warm and dry.  Psychiatric: He has a normal mood and affect. His behavior is normal.    BP 128/61   Pulse 74   Ht 5\' 8"  (1.727 m)   Wt 169 lb (76.7 kg)   SpO2 95%   BMI 25.70 kg/m  Wt Readings from Last 3 Encounters:  08/19/19 169 lb (76.7 kg)  05/19/19 173 lb (78.5 kg)  03/29/19 174 lb (78.9 kg)     Health Maintenance Due  Topic Date Due  . TETANUS/TDAP  07/14/2019    There are no preventive care reminders to display for this patient.  Lab Results  Component Value Date   TSH 1.58 02/18/2019   Lab Results  Component Value Date   WBC 8.1 01/29/2019   HGB 13.0 (L) 01/29/2019   HCT 36.7 (L) 01/29/2019   MCV 89.3 01/29/2019   PLT 172 01/29/2019   Lab Results  Component Value Date   NA 140 02/12/2019   K 4.1 02/12/2019   CO2 27 02/12/2019   GLUCOSE 69  02/12/2019   BUN 27 (H) 02/12/2019   CREATININE 1.27 (H) 02/12/2019   BILITOT 0.5 02/12/2019  ALKPHOS 59 10/23/2016   AST 18 02/12/2019   ALT 16 02/12/2019   PROT 6.3 02/12/2019   ALBUMIN 4.2 10/23/2016   CALCIUM 9.2 02/12/2019   Lab Results  Component Value Date   CHOL 121 02/08/2018   Lab Results  Component Value Date   HDL 49 02/08/2018   Lab Results  Component Value Date   LDLCALC 50 02/08/2018   Lab Results  Component Value Date   TRIG 140 02/08/2018   Lab Results  Component Value Date   CHOLHDL 2.5 02/08/2018   Lab Results  Component Value Date   HGBA1C 4.2 12/24/2013      Assessment & Plan:   Problem List Items Addressed This Visit      Nervous and Auditory   Vascular dementia without behavioral disturbance (HCC)   Relevant Medications   lamoTRIgine (LAMICTAL) 25 MG tablet   Other Relevant Orders   COMPLETE METABOLIC PANEL WITH GFR   Urine Microalbumin w/creat. ratio   Lipid panel     Musculoskeletal and Integument   Primary osteoarthritis of right knee    We did not do any additional work-up of the knee today again this is been an ongoing problem.  Right now he is actually been controlling it with just Tylenol and occasional ice.  Certainly if it gets more bothersome or becomes disruptive to sleep then recommend that he get back in with one of our sports med docs for possible injection and further work-up.        Genitourinary   CKD (chronic kidney disease) stage 3, GFR 30-59 ml/min - Primary    To recheck renal function.  Following every 6 months.      Relevant Orders   COMPLETE METABOLIC PANEL WITH GFR   Urine Microalbumin w/creat. ratio   Lipid panel     Other   History of stroke   Relevant Orders   COMPLETE METABOLIC PANEL WITH GFR   Urine Microalbumin w/creat. ratio   Lipid panel   Chronic midline low back pain without sciatica    Suspect osteoarthritis with degenerative disc.  No radicular symptoms currently.  Again usually  aggravated by heavy lifting encouraged him not to do heavy lifting if at all.  Just encouraged him to ask for help if needed.  Okay to use Tylenol and gentle stretches.  Right now the pain is not debilitating or interfering with daily activity.      Relevant Orders   DG Lumbar Spine Complete   Cervical pain    He does have significant decreased range of motion suspect severe to moderate osteoarthritis.  We will start by getting x-rays today since this is been going on for years.  He might truly benefit from physical therapy and we discussed that today encouraged him to think about it while we are waiting the results.  Also can use heating pad and or ice whichever feels better.      Relevant Orders   DG Cervical Spine Complete   BPH associated with nocturia   Relevant Medications   doxazosin (CARDURA) 1 MG tablet    Other Visit Diagnoses    Need for tetanus, diphtheria, and acellular pertussis (Tdap) vaccine in patient of adolescent age or older       Relevant Medications   Tdap ([place]) 5-2.5-18.5 LF-MCG/0.5 injection   Screening, lipid       Relevant Orders   COMPLETE METABOLIC PANEL WITH GFR   Urine Microalbumin w/creat. ratio   Lipid panel  Scription given for tetanus vaccine to take to the pharmacy with him today. Meds ordered this encounter  Medications  . Tdap (BOOSTRIX) 5-2.5-18.5 LF-MCG/0.5 injection    Sig: Inject 0.5 mLs into the muscle once for 1 dose.    Dispense:  0.5 mL    Refill:  0  . doxazosin (CARDURA) 1 MG tablet    Sig: Take 1 tablet (1 mg total) by mouth daily.    Dispense:  90 tablet    Refill:  1    Follow-up: Return in about 6 months (around 02/16/2020).    Beatrice Lecher, MD

## 2019-08-19 NOTE — Assessment & Plan Note (Signed)
He does have significant decreased range of motion suspect severe to moderate osteoarthritis.  We will start by getting x-rays today since this is been going on for years.  He might truly benefit from physical therapy and we discussed that today encouraged him to think about it while we are waiting the results.  Also can use heating pad and or ice whichever feels better.

## 2019-08-19 NOTE — Assessment & Plan Note (Signed)
We did not do any additional work-up of the knee today again this is been an ongoing problem.  Right now he is actually been controlling it with just Tylenol and occasional ice.  Certainly if it gets more bothersome or becomes disruptive to sleep then recommend that he get back in with one of our sports med docs for possible injection and further work-up.

## 2019-08-20 ENCOUNTER — Encounter: Payer: Self-pay | Admitting: Family Medicine

## 2019-08-20 LAB — COMPLETE METABOLIC PANEL WITH GFR
AG Ratio: 2.3 (calc) (ref 1.0–2.5)
ALT: 18 U/L (ref 9–46)
AST: 22 U/L (ref 10–35)
Albumin: 4.5 g/dL (ref 3.6–5.1)
Alkaline phosphatase (APISO): 69 U/L (ref 35–144)
BUN/Creatinine Ratio: 26 (calc) — ABNORMAL HIGH (ref 6–22)
BUN: 26 mg/dL — ABNORMAL HIGH (ref 7–25)
CO2: 26 mmol/L (ref 20–32)
Calcium: 9.3 mg/dL (ref 8.6–10.3)
Chloride: 104 mmol/L (ref 98–110)
Creat: 1.01 mg/dL (ref 0.70–1.18)
GFR, Est African American: 84 mL/min/{1.73_m2} (ref >=60)
GFR, Est Non African American: 72 mL/min/{1.73_m2} (ref >=60)
Globulin: 2 g/dL (calc) (ref 1.9–3.7)
Glucose, Bld: 91 mg/dL (ref 65–99)
Potassium: 4.5 mmol/L (ref 3.5–5.3)
Sodium: 140 mmol/L (ref 135–146)
Total Bilirubin: 0.4 mg/dL (ref 0.2–1.2)
Total Protein: 6.5 g/dL (ref 6.1–8.1)

## 2019-08-20 LAB — LIPID PANEL
Cholesterol: 110 mg/dL (ref <200)
HDL: 53 mg/dL (ref >=40)
LDL Cholesterol (Calc): 23 mg/dL (calc)
Non-HDL Cholesterol (Calc): 57 mg/dL (calc) (ref <130)
Total CHOL/HDL Ratio: 2.1 (calc) (ref <5.0)
Triglycerides: 289 mg/dL — ABNORMAL HIGH (ref <150)

## 2019-08-20 LAB — MICROALBUMIN / CREATININE URINE RATIO
Creatinine, Urine: 45 mg/dL (ref 20–320)
Microalb, Ur: <0.2 mg/dL

## 2019-08-23 ENCOUNTER — Other Ambulatory Visit: Payer: Self-pay | Admitting: Family Medicine

## 2019-08-23 DIAGNOSIS — N401 Enlarged prostate with lower urinary tract symptoms: Secondary | ICD-10-CM

## 2019-12-05 DIAGNOSIS — F0151 Vascular dementia with behavioral disturbance: Secondary | ICD-10-CM | POA: Diagnosis not present

## 2019-12-05 DIAGNOSIS — F3111 Bipolar disorder, current episode manic without psychotic features, mild: Secondary | ICD-10-CM | POA: Diagnosis not present

## 2020-01-01 DIAGNOSIS — H5213 Myopia, bilateral: Secondary | ICD-10-CM | POA: Diagnosis not present

## 2020-01-01 DIAGNOSIS — H2513 Age-related nuclear cataract, bilateral: Secondary | ICD-10-CM | POA: Diagnosis not present

## 2020-02-08 ENCOUNTER — Other Ambulatory Visit: Payer: Self-pay | Admitting: Sports Medicine

## 2020-02-08 DIAGNOSIS — M7989 Other specified soft tissue disorders: Secondary | ICD-10-CM

## 2020-02-17 ENCOUNTER — Ambulatory Visit (INDEPENDENT_AMBULATORY_CARE_PROVIDER_SITE_OTHER): Payer: Medicare Other | Admitting: Family Medicine

## 2020-02-17 ENCOUNTER — Encounter: Payer: Self-pay | Admitting: Family Medicine

## 2020-02-17 ENCOUNTER — Other Ambulatory Visit: Payer: Self-pay

## 2020-02-17 VITALS — BP 126/62 | HR 72 | Ht 68.0 in | Wt 172.0 lb

## 2020-02-17 DIAGNOSIS — F0150 Vascular dementia without behavioral disturbance: Secondary | ICD-10-CM

## 2020-02-17 DIAGNOSIS — M109 Gout, unspecified: Secondary | ICD-10-CM | POA: Insufficient documentation

## 2020-02-17 DIAGNOSIS — R7309 Other abnormal glucose: Secondary | ICD-10-CM

## 2020-02-17 DIAGNOSIS — N1831 Chronic kidney disease, stage 3a: Secondary | ICD-10-CM

## 2020-02-17 DIAGNOSIS — M1A09X0 Idiopathic chronic gout, multiple sites, without tophus (tophi): Secondary | ICD-10-CM | POA: Diagnosis not present

## 2020-02-17 NOTE — Assessment & Plan Note (Signed)
Recommend rescreen with Psychiatrist since they have been following this issue. Not currently on medication. He feels it is getting wrose.

## 2020-02-17 NOTE — Progress Notes (Signed)
Established Patient Office Visit  Subjective:  Patient ID: Andres Stevens, male    DOB: 13-Feb-1946  Age: 74 y.o. MRN: [id number]  CC: No chief complaint on file.   HPI Andres Stevens presents for dementia.  He feels it has gotten worse.   Bipolar - he pyschiatrist is leaving so may try to get back in with his old psych. [facility]  Dr Thurnell Garbe who went into private practice.   F/U CKD 3 - no changes in urination. No blood in the urine.  Past Medical History:  Diagnosis Date  . Bipolar 1 disorder ([place])   . Wears hearing aid     Past Surgical History:  Procedure Laterality Date  . CERVICAL FUSION      Family History  Problem Relation Age of Onset  . Depression Father     Social History   Socioeconomic History  . Marital status: Married    Spouse name: Not on file  . Number of children: Not on file  . Years of education: Not on file  . Highest education level: Not on file  Occupational History  . Not on file  Tobacco Use  . Smoking status: Former Smoker    Packs/day: 0.25    Types: Cigarettes    Quit date: 06/26/2015    Years since quitting: 4.6  . Smokeless tobacco: Never Used  Substance and Sexual Activity  . Alcohol use: Yes    Alcohol/week: 0.0 standard drinks  . Drug use: No  . Sexual activity: Not on file  Other Topics Concern  . Not on file  Social History Narrative  . Not on file   Social Determinants of Health   Financial Resource Strain:   . Difficulty of Paying Living Expenses:   Food Insecurity:   . Worried About Charity fundraiser in the Last Year:   . Arboriculturist in the Last Year:   Transportation Needs:   . Film/video editor (Medical):   Marland Kitchen Lack of Transportation (Non-Medical):   Physical Activity:   . Days of Exercise per Week:   . Minutes of Exercise per Session:   Stress:   . Feeling of Stress :   Social Connections:   . Frequency of Communication with Friends and Family:   . Frequency of Social Gatherings with  Friends and Family:   . Attends Religious Services:   . Active Member of Clubs or Organizations:   . Attends Archivist Meetings:   Marland Kitchen Marital Status:   Intimate Partner Violence:   . Fear of Current or Ex-Partner:   . Emotionally Abused:   Marland Kitchen Physically Abused:   . Sexually Abused:     Outpatient Medications Prior to Visit  Medication Sig Dispense Refill  . lamoTRIgine (LAMICTAL) 25 MG tablet Take 75 mg by mouth every morning.    Marland Kitchen acetaminophen (TYLENOL) 650 MG CR tablet Take 2 tablets (1,300 mg total) by mouth every 8 (eight) hours as needed for pain. 90 tablet 3  . allopurinol (ZYLOPRIM) 300 MG tablet Take 1 tablet by mouth once daily 90 tablet 0  . atorvastatin (LIPITOR) 40 MG tablet Take 1 tablet by mouth once daily 90 tablet 3  . B Complex-C CAPS Take 1 capsule by mouth daily.    . Cholecalciferol 125 MCG (5000 UT) capsule Take 10,000 Units by mouth daily.    . DiphenhydrAMINE HCl (ALLERGY MED PO) Take by mouth.    . doxazosin (CARDURA) 1 MG tablet Take 1 tablet (1  mg total) by mouth daily. 90 tablet 1  . lamoTRIgine (LAMICTAL) 100 MG tablet Take 100 mg by mouth at bedtime.    . lamoTRIgine (LAMICTAL) 25 MG tablet Take 50 mg by mouth every morning.     No facility-administered medications prior to visit.    Allergies  Allergen Reactions  . Bee Venom Anaphylaxis    Hx of local rxn     ROS Review of Systems    Objective:    Physical Exam  Constitutional: He is oriented to person, place, and time. He appears well-developed and well-nourished.  HENT:  Head: Normocephalic and atraumatic.  Cardiovascular: Normal rate, regular rhythm and normal heart sounds.  Pulmonary/Chest: Effort normal and breath sounds normal.  Neurological: He is alert and oriented to person, place, and time.  Skin: Skin is warm and dry.  Psychiatric: He has a normal mood and affect. His behavior is normal.    There were no vitals taken for this visit. Wt Readings from Last 3  Encounters:  08/19/19 169 lb (76.7 kg)  05/19/19 173 lb (78.5 kg)  03/29/19 174 lb (78.9 kg)     There are no preventive care reminders to display for this patient.  There are no preventive care reminders to display for this patient.  Lab Results  Component Value Date   TSH 1.58 02/18/2019   Lab Results  Component Value Date   WBC 8.1 01/29/2019   HGB 13.0 (L) 01/29/2019   HCT 36.7 (L) 01/29/2019   MCV 89.3 01/29/2019   PLT 172 01/29/2019   Lab Results  Component Value Date   NA 140 08/19/2019   K 4.5 08/19/2019   CO2 26 08/19/2019   GLUCOSE 91 08/19/2019   BUN 26 (H) 08/19/2019   CREATININE 1.01 08/19/2019   BILITOT 0.4 08/19/2019   ALKPHOS 59 10/23/2016   AST 22 08/19/2019   ALT 18 08/19/2019   PROT 6.5 08/19/2019   ALBUMIN 4.2 10/23/2016   CALCIUM 9.3 08/19/2019   Lab Results  Component Value Date   CHOL 110 08/19/2019   Lab Results  Component Value Date   HDL 53 08/19/2019   Lab Results  Component Value Date   LDLCALC 23 08/19/2019   Lab Results  Component Value Date   TRIG 289 (H) 08/19/2019   Lab Results  Component Value Date   CHOLHDL 2.1 08/19/2019   Lab Results  Component Value Date   HGBA1C 4.2 12/24/2013      Assessment & Plan:   Problem List Items Addressed This Visit      Nervous and Auditory   Vascular dementia without behavioral disturbance ([place])    Recommend rescreen with Psychiatrist since they have been following this issue. Not currently on medication. He feels it is getting wrose.       Relevant Medications   lamoTRIgine (LAMICTAL) 25 MG tablet   Other Relevant Orders   BASIC METABOLIC PANEL WITH GFR   Hemoglobin A1c     Genitourinary   CKD (chronic kidney disease) stage 3, GFR 30-59 ml/min - Primary    F/U renal function Q 6 mo      Relevant Orders   BASIC METABOLIC PANEL WITH GFR   Hemoglobin A1c     Other   Gout    No recent flares.  Due to rcheck uric acid.       Relevant Orders   Uric acid     Other Visit Diagnoses    Other abnormal glucose  Relevant Orders   Hemoglobin A1c     Will screen for diabetes.    No orders of the defined types were placed in this encounter.   Follow-up: Return in about 6 months (around 08/19/2020) for Gout and kidney check up.    Andres Lecher, MD

## 2020-02-17 NOTE — Assessment & Plan Note (Signed)
No recent flares.  Due to rcheck uric acid.

## 2020-02-17 NOTE — Assessment & Plan Note (Signed)
F/U renal function Q 6 mo

## 2020-02-18 LAB — HEMOGLOBIN A1C
Hgb A1c MFr Bld: 4.6 % of total Hgb (ref <5.7)
Mean Plasma Glucose: 85 (calc)
eAG (mmol/L): 4.7 (calc)

## 2020-02-18 LAB — BASIC METABOLIC PANEL WITH GFR
BUN/Creatinine Ratio: 30 (calc) — ABNORMAL HIGH (ref 6–22)
BUN: 37 mg/dL — ABNORMAL HIGH (ref 7–25)
CO2: 28 mmol/L (ref 20–32)
Calcium: 9.3 mg/dL (ref 8.6–10.3)
Chloride: 103 mmol/L (ref 98–110)
Creat: 1.24 mg/dL — ABNORMAL HIGH (ref 0.70–1.18)
GFR, Est African American: 65 mL/min/{1.73_m2} (ref >=60)
GFR, Est Non African American: 57 mL/min/{1.73_m2} — ABNORMAL LOW (ref >=60)
Glucose, Bld: 85 mg/dL (ref 65–139)
Potassium: 4.8 mmol/L (ref 3.5–5.3)
Sodium: 140 mmol/L (ref 135–146)

## 2020-02-18 LAB — URIC ACID: Uric Acid, Serum: 4.2 mg/dL (ref 4.0–8.0)

## 2020-02-21 ENCOUNTER — Other Ambulatory Visit: Payer: Self-pay | Admitting: Family Medicine

## 2020-02-21 DIAGNOSIS — N401 Enlarged prostate with lower urinary tract symptoms: Secondary | ICD-10-CM

## 2020-02-28 ENCOUNTER — Other Ambulatory Visit: Payer: Self-pay | Admitting: Family Medicine

## 2020-04-06 ENCOUNTER — Encounter: Payer: Self-pay | Admitting: Family Medicine

## 2020-04-06 DIAGNOSIS — F0150 Vascular dementia without behavioral disturbance: Secondary | ICD-10-CM

## 2020-04-07 NOTE — Telephone Encounter (Signed)
Task completed. Referral sent to preferred provider.

## 2020-05-08 ENCOUNTER — Other Ambulatory Visit: Payer: Self-pay | Admitting: Sports Medicine

## 2020-05-08 DIAGNOSIS — M79674 Pain in right toe(s): Secondary | ICD-10-CM

## 2020-05-17 ENCOUNTER — Encounter: Payer: Self-pay | Admitting: Family Medicine

## 2020-05-19 ENCOUNTER — Other Ambulatory Visit: Payer: Self-pay | Admitting: *Deleted

## 2020-05-19 DIAGNOSIS — M7989 Other specified soft tissue disorders: Secondary | ICD-10-CM

## 2020-05-19 MED ORDER — ALLOPURINOL 300 MG PO TABS: 300.0000 mg | ORAL_TABLET | Freq: Every day | ORAL | 3 refills | Status: DC

## 2020-06-05 ENCOUNTER — Other Ambulatory Visit: Payer: Self-pay | Admitting: Sports Medicine

## 2020-06-05 DIAGNOSIS — M79674 Pain in right toe(s): Secondary | ICD-10-CM

## 2020-07-08 ENCOUNTER — Other Ambulatory Visit: Payer: Self-pay

## 2020-07-08 ENCOUNTER — Ambulatory Visit (INDEPENDENT_AMBULATORY_CARE_PROVIDER_SITE_OTHER): Payer: Medicare Other | Admitting: Family Medicine

## 2020-07-08 DIAGNOSIS — Z23 Encounter for immunization: Secondary | ICD-10-CM | POA: Diagnosis not present

## 2020-07-17 ENCOUNTER — Other Ambulatory Visit: Payer: Self-pay | Admitting: Sports Medicine

## 2020-07-17 DIAGNOSIS — M79674 Pain in right toe(s): Secondary | ICD-10-CM

## 2020-07-17 DIAGNOSIS — M7989 Other specified soft tissue disorders: Secondary | ICD-10-CM

## 2020-07-23 ENCOUNTER — Encounter: Payer: Self-pay | Admitting: Medical-Surgical

## 2020-07-23 ENCOUNTER — Ambulatory Visit (INDEPENDENT_AMBULATORY_CARE_PROVIDER_SITE_OTHER): Payer: Medicare Other | Admitting: Medical-Surgical

## 2020-07-23 VITALS — BP 104/60 | HR 69 | Temp 97.6°F | Ht 68.0 in | Wt 168.7 lb

## 2020-07-23 DIAGNOSIS — I998 Other disorder of circulatory system: Secondary | ICD-10-CM | POA: Diagnosis not present

## 2020-07-23 NOTE — Progress Notes (Signed)
Subjective:    CC: blood pressure concern  HPI: Pleasant 74 year old male accompanied by his wife presenting for evaluation of blood pressure after several elevated readings at home.  He was seen last week at psychiatry and had an elevated blood pressure of 153/80.  They have been checking blood pressures at home using an arm cuff and notes that 2 days ago his systolic was also in the 700F.  Yesterday evening they checked his blood pressure with systolics in the 749S on one arm and 200 on the other arm.  He is asymptomatic overall and there has been no change in his mentation, behaviors, or overall health status.  He does have a remote history of a hemorrhagic stroke but no new symptoms to indicate a possible recurrence.  Today in office, his blood pressures are beautiful at 118/67 and 104/60 on recheck.  He is taking Cardura 1 mg daily, tolerating well without side effects.  Denies fever, chills, chest pain, shortness of breath, dizziness, headaches, altered mental status, weakness, difficulty speaking, paresthesias, and difficulty swallowing.  I reviewed the past medical history, family history, social history, surgical history, and allergies today and no changes were needed.  Please see the problem list section below in epic for further details.  Past Medical History: Past Medical History:  Diagnosis Date  . Bipolar 1 disorder ([place])   . Wears hearing aid    Past Surgical History: Past Surgical History:  Procedure Laterality Date  . CERVICAL FUSION     Social History: Social History   Socioeconomic History  . Marital status: Married    Spouse name: Not on file  . Number of children: Not on file  . Years of education: Not on file  . Highest education level: Not on file  Occupational History  . Not on file  Tobacco Use  . Smoking status: Former Smoker    Packs/day: 0.25    Types: Cigarettes    Quit date: 06/26/2015    Years since quitting: 5.0  . Smokeless tobacco: Never Used   Substance and Sexual Activity  . Alcohol use: Yes    Alcohol/week: 0.0 standard drinks  . Drug use: No  . Sexual activity: Not on file  Other Topics Concern  . Not on file  Social History Narrative  . Not on file   Social Determinants of Health   Financial Resource Strain:   . Difficulty of Paying Living Expenses: Not on file  Food Insecurity:   . Worried About Charity fundraiser in the Last Year: Not on file  . Ran Out of Food in the Last Year: Not on file  Transportation Needs:   . Lack of Transportation (Medical): Not on file  . Lack of Transportation (Non-Medical): Not on file  Physical Activity:   . Days of Exercise per Week: Not on file  . Minutes of Exercise per Session: Not on file  Stress:   . Feeling of Stress : Not on file  Social Connections:   . Frequency of Communication with Friends and Family: Not on file  . Frequency of Social Gatherings with Friends and Family: Not on file  . Attends Religious Services: Not on file  . Active Member of Clubs or Organizations: Not on file  . Attends Archivist Meetings: Not on file  . Marital Status: Not on file   Family History: Family History  Problem Relation Age of Onset  . Depression Father    Allergies: Allergies  Allergen Reactions  .  Bee Venom Anaphylaxis    Hx of local rxn    Medications: See med rec.  Review of Systems: See HPI for pertinent positives and negatives.   Objective:    General: Well Developed, well nourished, and in no acute distress.  Neuro: Alert and oriented x3, extra-ocular muscles intact, sensation grossly intact.  HEENT: Normocephalic.  Skin: Warm and dry. Cardiac: Regular rate and rhythm, no murmurs rubs or gallops, no lower extremity edema.  Respiratory: Clear to auscultation bilaterally. Not using accessory muscles, speaking in full sentences.   Impression and Recommendations:    1. Fluctuating blood pressure Blood pressure in office today is at goal.  There are  no presenting symptoms or exam findings to indicate that there may be a concern for stroke.  Do not feel at this time that further imaging such as CT head or MRI brain is warranted.  We will do plasma/urine catecholamines and plasma/urine metanephrines to evaluate for potential adrenal etiology.  Continue Cardura 1 mg daily.  Advised monitoring blood pressure at home.  May be beneficial to bring blood pressure cuff to next appointment to verify accuracy. - Catecholamines, Fractionated, Plasma - Catecholamines, fractionated, urine, 24 hour; Future - Metanephrines, plasma - Metanephrines, urine, 24 hour; Future  Return if symptoms worsen or fail to improve. ___________________________________________ Clearnce Sorrel, DNP, APRN, FNP-BC [facility]

## 2020-07-24 ENCOUNTER — Other Ambulatory Visit: Payer: Self-pay | Admitting: Sports Medicine

## 2020-07-24 DIAGNOSIS — M7989 Other specified soft tissue disorders: Secondary | ICD-10-CM

## 2020-07-27 DIAGNOSIS — F0390 Unspecified dementia without behavioral disturbance: Secondary | ICD-10-CM | POA: Diagnosis not present

## 2020-07-27 DIAGNOSIS — R4182 Altered mental status, unspecified: Secondary | ICD-10-CM | POA: Diagnosis not present

## 2020-07-27 DIAGNOSIS — F319 Bipolar disorder, unspecified: Secondary | ICD-10-CM | POA: Diagnosis not present

## 2020-07-27 DIAGNOSIS — I998 Other disorder of circulatory system: Secondary | ICD-10-CM | POA: Diagnosis not present

## 2020-07-27 DIAGNOSIS — Z87891 Personal history of nicotine dependence: Secondary | ICD-10-CM | POA: Diagnosis not present

## 2020-07-27 DIAGNOSIS — I10 Essential (primary) hypertension: Secondary | ICD-10-CM | POA: Diagnosis not present

## 2020-07-27 DIAGNOSIS — Z8673 Personal history of transient ischemic attack (TIA), and cerebral infarction without residual deficits: Secondary | ICD-10-CM | POA: Diagnosis not present

## 2020-07-27 DIAGNOSIS — Z91030 Bee allergy status: Secondary | ICD-10-CM | POA: Diagnosis not present

## 2020-07-27 DIAGNOSIS — E785 Hyperlipidemia, unspecified: Secondary | ICD-10-CM | POA: Diagnosis not present

## 2020-07-27 DIAGNOSIS — R519 Headache, unspecified: Secondary | ICD-10-CM | POA: Diagnosis not present

## 2020-07-27 DIAGNOSIS — Z79899 Other long term (current) drug therapy: Secondary | ICD-10-CM | POA: Diagnosis not present

## 2020-07-28 ENCOUNTER — Other Ambulatory Visit: Payer: Self-pay

## 2020-07-28 DIAGNOSIS — M79674 Pain in right toe(s): Secondary | ICD-10-CM

## 2020-07-28 MED ORDER — ALLOPURINOL 300 MG PO TABS: 300.0000 mg | ORAL_TABLET | Freq: Every day | ORAL | 1 refills | Status: DC

## 2020-07-28 NOTE — Telephone Encounter (Signed)
Andres Stevens is requesting a refill on Allopurinol. Prescribed by Dr Dianah Field.

## 2020-07-29 ENCOUNTER — Other Ambulatory Visit: Payer: Self-pay | Admitting: Medical-Surgical

## 2020-07-29 DIAGNOSIS — Z79899 Other long term (current) drug therapy: Secondary | ICD-10-CM | POA: Diagnosis not present

## 2020-07-29 DIAGNOSIS — R948 Abnormal results of function studies of other organs and systems: Secondary | ICD-10-CM

## 2020-07-29 DIAGNOSIS — I998 Other disorder of circulatory system: Secondary | ICD-10-CM

## 2020-07-29 DIAGNOSIS — R7989 Other specified abnormal findings of blood chemistry: Secondary | ICD-10-CM

## 2020-08-02 NOTE — Addendum Note (Signed)
Addended bySamuel Bouche on: 08/02/2020 02:30 PM   Modules accepted: Orders

## 2020-08-03 ENCOUNTER — Ambulatory Visit (INDEPENDENT_AMBULATORY_CARE_PROVIDER_SITE_OTHER): Payer: Medicare Other | Admitting: Family Medicine

## 2020-08-03 ENCOUNTER — Encounter: Payer: Self-pay | Admitting: Family Medicine

## 2020-08-03 VITALS — BP 135/69 | HR 63 | Ht 68.0 in | Wt 172.0 lb

## 2020-08-03 DIAGNOSIS — R770 Abnormality of albumin: Secondary | ICD-10-CM | POA: Diagnosis not present

## 2020-08-03 DIAGNOSIS — I998 Other disorder of circulatory system: Secondary | ICD-10-CM | POA: Diagnosis not present

## 2020-08-03 DIAGNOSIS — R7989 Other specified abnormal findings of blood chemistry: Secondary | ICD-10-CM | POA: Diagnosis not present

## 2020-08-03 NOTE — Progress Notes (Signed)
Established Patient Office Visit  Subjective:  Patient ID: Andres Stevens, male    DOB: 19-Oct-1945  Age: 74 y.o. MRN: [id number]  CC:  Chief Complaint  Patient presents with  . Follow-up    bp    HPI Andres Stevens presents for abnormal blood pressure levels they have been up and down.  More recently blood pressures have been significantly elevated more in the evenings and then low in the morning.  He was on doxazosin 1 mg daily.  The highest blood pressure that his wife recorded was 213/109 and the lowest of the other day was 95/50 5 in the morning.  They increased his doxazosin to 2 mg daily.  His wife reports that she has been giving him 1 mg twice a day for the last couple days and blood pressures have been running between the 120s and 130s in the morning and 170s up to 186 in the evenings.  His blood pressures typically are normal and he is never had hypertension.  When his blood pressure shoots up he looks flushed and does not feel well in fact he saw one of the providers in our office about a week and a half ago.  Then on November 2 he went to the emergency department because of his elevated blood pressures again that was the evening when the blood pressure was 213/109.  Reported that he was also shaking at the time, like he was having cold chills.  He had not missed any of his medications and he was afebrile.  He does have dementia but she says he is not been quite like himself he has been a little bit more off than usual over the last week or so.  Labs that were done in the ED were essentially normal.  Also had a head CT which showed some mild atherosclerosis as well as some right frontal lobe encephalomalacia unchanged from previous.  Otherwise no sign of acute stroke etc.  A little bit of thickening of the ethmoid and frontal sinuses.  No mass or swelling.  This x-ray was also normal.  Labs in ED show calcium of 10.3 and albumin 5.1, WBC were normal.    Past Medical History:   Diagnosis Date  . Bipolar 1 disorder ([place])   . Wears hearing aid     Past Surgical History:  Procedure Laterality Date  . CERVICAL FUSION      Family History  Problem Relation Age of Onset  . Depression Father     Social History   Socioeconomic History  . Marital status: Married    Spouse name: Not on file  . Number of children: Not on file  . Years of education: Not on file  . Highest education level: Not on file  Occupational History  . Not on file  Tobacco Use  . Smoking status: Former Smoker    Packs/day: 0.25    Types: Cigarettes    Quit date: 06/26/2015    Years since quitting: 5.1  . Smokeless tobacco: Never Used  Substance and Sexual Activity  . Alcohol use: Yes    Alcohol/week: 0.0 standard drinks  . Drug use: No  . Sexual activity: Not on file  Other Topics Concern  . Not on file  Social History Narrative  . Not on file   Social Determinants of Health   Financial Resource Strain:   . Difficulty of Paying Living Expenses: Not on file  Food Insecurity:   . Worried About Charity fundraiser  in the Last Year: Not on file  . Ran Out of Food in the Last Year: Not on file  Transportation Needs:   . Lack of Transportation (Medical): Not on file  . Lack of Transportation (Non-Medical): Not on file  Physical Activity:   . Days of Exercise per Week: Not on file  . Minutes of Exercise per Session: Not on file  Stress:   . Feeling of Stress : Not on file  Social Connections:   . Frequency of Communication with Friends and Family: Not on file  . Frequency of Social Gatherings with Friends and Family: Not on file  . Attends Religious Services: Not on file  . Active Member of Clubs or Organizations: Not on file  . Attends Archivist Meetings: Not on file  . Marital Status: Not on file  Intimate Partner Violence:   . Fear of Current or Ex-Partner: Not on file  . Emotionally Abused: Not on file  . Physically Abused: Not on file  . Sexually  Abused: Not on file    Outpatient Medications Prior to Visit  Medication Sig Dispense Refill  . acetaminophen (TYLENOL) 650 MG CR tablet Take 2 tablets (1,300 mg total) by mouth every 8 (eight) hours as needed for pain. 90 tablet 3  . allopurinol (ZYLOPRIM) 300 MG tablet Take 1 tablet (300 mg total) by mouth daily. 90 tablet 1  . atorvastatin (LIPITOR) 40 MG tablet Take 1 tablet by mouth once daily 90 tablet 3  . B Complex-C CAPS Take 1 capsule by mouth daily.    . Cholecalciferol 125 MCG (5000 UT) capsule Take 10,000 Units by mouth daily.    . DiphenhydrAMINE HCl (ALLERGY MED PO) Take by mouth.    . doxazosin (CARDURA) 1 MG tablet Take 2 mg by mouth at bedtime.    . lamoTRIgine (LAMICTAL) 100 MG tablet Take 100 mg by mouth at bedtime.    . lamoTRIgine (LAMICTAL) 25 MG tablet Take 75 mg by mouth every morning.    Marland Kitchen doxazosin (CARDURA) 1 MG tablet Take 1 tablet by mouth once daily 90 tablet 3   No facility-administered medications prior to visit.    Allergies  Allergen Reactions  . Bee Venom Anaphylaxis    Hx of local rxn     ROS Review of Systems    Objective:    Physical Exam Constitutional:      Appearance: He is well-developed.  HENT:     Head: Normocephalic and atraumatic.  Cardiovascular:     Rate and Rhythm: Normal rate and regular rhythm.     Heart sounds: Normal heart sounds.  Pulmonary:     Effort: Pulmonary effort is normal.     Breath sounds: Normal breath sounds.  Skin:    General: Skin is warm and dry.  Neurological:     Mental Status: He is alert and oriented to person, place, and time.  Psychiatric:        Behavior: Behavior normal.     BP 135/69   Pulse 63   Ht 5\' 8"  (1.727 m)   Wt 172 lb (78 kg)   SpO2 100%   BMI 26.15 kg/m  Wt Readings from Last 3 Encounters:  08/03/20 172 lb (78 kg)  07/23/20 168 lb 11.2 oz (76.5 kg)  02/18/20 172 lb (78 kg)     There are no preventive care reminders to display for this patient.  There are no  preventive care reminders to display for this patient.  Lab  Results  Component Value Date   TSH 1.58 02/18/2019   Lab Results  Component Value Date   WBC 8.1 01/29/2019   HGB 13.0 (L) 01/29/2019   HCT 36.7 (L) 01/29/2019   MCV 89.3 01/29/2019   PLT 172 01/29/2019   Lab Results  Component Value Date   NA 140 02/17/2020   K 4.8 02/17/2020   CO2 28 02/17/2020   GLUCOSE 85 02/17/2020   BUN 37 (H) 02/17/2020   CREATININE 1.24 (H) 02/17/2020   BILITOT 0.4 08/19/2019   ALKPHOS 59 10/23/2016   AST 22 08/19/2019   ALT 18 08/19/2019   PROT 6.5 08/19/2019   ALBUMIN 4.2 10/23/2016   CALCIUM 9.3 02/17/2020   Lab Results  Component Value Date   CHOL 110 08/19/2019   Lab Results  Component Value Date   HDL 53 08/19/2019   Lab Results  Component Value Date   LDLCALC 23 08/19/2019   Lab Results  Component Value Date   TRIG 289 (H) 08/19/2019   Lab Results  Component Value Date   CHOLHDL 2.1 08/19/2019   Lab Results  Component Value Date   HGBA1C 4.6 02/17/2020      Assessment & Plan:   Problem List Items Addressed This Visit    None    Visit Diagnoses    Elevated plasma metanephrines    -  Primary   Fluctuating blood pressure       Serum calcium elevated       Relevant Orders   COMPLETE METABOLIC PANEL WITH GFR   Abnormal albumin       Relevant Orders   COMPLETE METABOLIC PANEL WITH GFR     Fluctuant in blood pressure with elevated plasma metanephrines-CT pending to evaluate for possible pheochromocytoma.  Discussed this with him and his wife today.  Also gave parameters around when to dose the Cardura.  We will continue with 1 mg twice daily instead of 2 mg at bedtime to see if this helps.  If blood pressure is 170 or higher than go ahead and dose the second dose of doxazosin early versus waiting until bedtime.  Can take up to 2 mg in the evening if needed.  But need to watch for hypotension in the mornings.  Avoid excess salt intake.  Make sure hydrating well.   ~Awaiting catecholamine levels.  Calcium and albumin were both elevated in the emergency department will plan to recheck those labs either later this week or early next week.  No orders of the defined types were placed in this encounter.  I spent 35 minutes on the day of the encounter to include pre-visit record review, face-to-face time with the patient and post visit ordering of test.   Follow-up: No follow-ups on file.    Beatrice Lecher, MD

## 2020-08-09 ENCOUNTER — Other Ambulatory Visit: Payer: Self-pay

## 2020-08-09 ENCOUNTER — Ambulatory Visit (INDEPENDENT_AMBULATORY_CARE_PROVIDER_SITE_OTHER): Payer: Medicare Other

## 2020-08-09 DIAGNOSIS — I998 Other disorder of circulatory system: Secondary | ICD-10-CM | POA: Diagnosis not present

## 2020-08-09 DIAGNOSIS — R948 Abnormal results of function studies of other organs and systems: Secondary | ICD-10-CM | POA: Diagnosis not present

## 2020-08-09 DIAGNOSIS — R7989 Other specified abnormal findings of blood chemistry: Secondary | ICD-10-CM | POA: Diagnosis not present

## 2020-08-09 DIAGNOSIS — I700 Atherosclerosis of aorta: Secondary | ICD-10-CM | POA: Diagnosis not present

## 2020-08-09 DIAGNOSIS — N281 Cyst of kidney, acquired: Secondary | ICD-10-CM | POA: Diagnosis not present

## 2020-08-09 DIAGNOSIS — I10 Essential (primary) hypertension: Secondary | ICD-10-CM | POA: Diagnosis not present

## 2020-08-09 DIAGNOSIS — R947 Abnormal results of other endocrine function studies: Secondary | ICD-10-CM

## 2020-08-09 MED ORDER — IOHEXOL 300 MG/ML  SOLN
100.0000 mL | Freq: Once | INTRAMUSCULAR | Status: AC | PRN
  Administered 2020-08-09: 100 mL via INTRAVENOUS

## 2020-08-10 ENCOUNTER — Other Ambulatory Visit: Payer: Self-pay | Admitting: Medical-Surgical

## 2020-08-10 DIAGNOSIS — I998 Other disorder of circulatory system: Secondary | ICD-10-CM

## 2020-08-10 DIAGNOSIS — R7989 Other specified abnormal findings of blood chemistry: Secondary | ICD-10-CM

## 2020-08-11 ENCOUNTER — Encounter: Payer: Self-pay | Admitting: Family Medicine

## 2020-08-12 ENCOUNTER — Other Ambulatory Visit: Payer: Self-pay | Admitting: *Deleted

## 2020-08-12 DIAGNOSIS — K209 Esophagitis, unspecified without bleeding: Secondary | ICD-10-CM

## 2020-08-13 LAB — CATECHOLAMINES, FRACTIONATED, PLASMA
Dopamine: 308 pg/mL — ABNORMAL HIGH
Epinephrine: <20 pg/mL
Norepinephrine: 1973 pg/mL — ABNORMAL HIGH
Total Catecholamines: 2281 pg/mL — ABNORMAL HIGH

## 2020-08-13 LAB — METANEPHRINES, URINE, 24 HOUR
Metaneph Total, Ur: 1061 mcg/24 h — ABNORMAL HIGH (ref 224–832)
Metanephrines, Ur: 179 mcg/24 h (ref 90–315)
Normetanephrine, 24H Ur: 882 mcg/24 h — ABNORMAL HIGH (ref 122–676)
Volume, Urine-VMAUR: 1500 mL

## 2020-08-13 LAB — TEST AUTHORIZATION

## 2020-08-13 LAB — CATECHOLAMINES, FRACTIONATED, 24-HOUR UR W/O CREATININE
Calculated Total (E+NE): 67 mcg/24 h (ref 26–121)
Dopamine, 24 hr Urine: 192 mcg/24 h (ref 52–480)
Epinephrine, 24 hr Urine: 4 mcg/24 h (ref 2–24)
Norepinephrine, 24 hr Ur: 63 mcg/24 h (ref 15–100)
Volume, Urine-VMAUR: 1500 mL/24 h

## 2020-08-13 LAB — METANEPHRINES, PLASMA
Metanephrine, Free: 42 pg/mL (ref <=57)
Normetanephrine, Free: 513 pg/mL — ABNORMAL HIGH (ref <=148)
Total Metanephrines-Plasma: 555 pg/mL — ABNORMAL HIGH (ref <=205)

## 2020-08-16 ENCOUNTER — Encounter: Payer: Self-pay | Admitting: Family Medicine

## 2020-08-17 NOTE — Telephone Encounter (Signed)
Andres Stevens, can you call and see if we could get him in any sooner at our [facility] location with Dr. Kelton Pillar?  Since it sounds like he is not got an appointment until January.

## 2020-08-25 DIAGNOSIS — R6881 Early satiety: Secondary | ICD-10-CM | POA: Diagnosis not present

## 2020-08-25 DIAGNOSIS — R933 Abnormal findings on diagnostic imaging of other parts of digestive tract: Secondary | ICD-10-CM | POA: Diagnosis not present

## 2020-08-26 ENCOUNTER — Ambulatory Visit (INDEPENDENT_AMBULATORY_CARE_PROVIDER_SITE_OTHER): Payer: Medicare Other | Admitting: Family Medicine

## 2020-08-26 ENCOUNTER — Other Ambulatory Visit: Payer: Self-pay

## 2020-08-26 ENCOUNTER — Encounter: Payer: Self-pay | Admitting: Family Medicine

## 2020-08-26 VITALS — BP 112/55 | HR 68 | Ht 68.0 in | Wt 169.0 lb

## 2020-08-26 DIAGNOSIS — R351 Nocturia: Secondary | ICD-10-CM | POA: Diagnosis not present

## 2020-08-26 DIAGNOSIS — F0150 Vascular dementia without behavioral disturbance: Secondary | ICD-10-CM

## 2020-08-26 DIAGNOSIS — I998 Other disorder of circulatory system: Secondary | ICD-10-CM | POA: Diagnosis not present

## 2020-08-26 DIAGNOSIS — N1831 Chronic kidney disease, stage 3a: Secondary | ICD-10-CM

## 2020-08-26 DIAGNOSIS — R251 Tremor, unspecified: Secondary | ICD-10-CM | POA: Diagnosis not present

## 2020-08-26 DIAGNOSIS — T148XXA Other injury of unspecified body region, initial encounter: Secondary | ICD-10-CM | POA: Diagnosis not present

## 2020-08-26 DIAGNOSIS — M1A09X0 Idiopathic chronic gout, multiple sites, without tophus (tophi): Secondary | ICD-10-CM

## 2020-08-26 DIAGNOSIS — N401 Enlarged prostate with lower urinary tract symptoms: Secondary | ICD-10-CM

## 2020-08-26 MED ORDER — DOXAZOSIN MESYLATE 1 MG PO TABS
2.0000 mg | ORAL_TABLET | Freq: Two times a day (BID) | ORAL | 1 refills | Status: DC
Start: 2020-08-26 — End: 2021-01-13

## 2020-08-26 NOTE — Telephone Encounter (Signed)
I sent referral to Dr Kelton Pillar at [facility] endocrin[facility] [place]

## 2020-08-26 NOTE — Assessment & Plan Note (Signed)
I am concerned that he may have had another stroke.  We will go ahead and work-up with MRI we will try to see if we can get that scheduled over the next couple days.

## 2020-08-26 NOTE — Progress Notes (Signed)
Established Patient Office Visit  Subjective:  Patient ID: Andres Stevens, male    DOB: [birth year]  Age: 74 y.o. MRN: [id number]  CC:  Chief Complaint  Patient presents with  . Follow-up    HPI Andres Stevens presents for follow-up.  We also made a referral to endocrinology for possible pheochromocytoma.  Blood pressures are still fluctuating though today it looks good.  He is taking his medications regularly.  His wife is also concerned because his dementia seems to be worsening he has been more disoriented the last 2 weeks.  He seems less engaged.  Says his hands have been shaking intermittently.  Only noticing it in the hands not in the feet there seem to be no triggers.  Sometimes it is mild sometimes it is more intense.  She is having to make sure he is actually swallowing his medication, o/w will spit them out.  He has been wandering more at night.  He is also making mistakes like peeling the banana to put into his cereal but then putting the peel and the banana into the cereal.  She says 3 weeks ago he was not doing this it was a very consistent routine.  Feels like he is not processing things normally.  He does have a history of stroke.  Also having some easy bruising.    Gout -no recent flares or exacerbations.  Past Medical History:  Diagnosis Date  . Bipolar 1 disorder ([place])   . Wears hearing aid     Past Surgical History:  Procedure Laterality Date  . CERVICAL FUSION      Family History  Problem Relation Age of Onset  . Depression Father     Social History   Socioeconomic History  . Marital status: Married    Spouse name: Not on file  . Number of children: Not on file  . Years of education: Not on file  . Highest education level: Not on file  Occupational History  . Not on file  Tobacco Use  . Smoking status: Former Smoker    Packs/day: 0.25    Types: Cigarettes    Quit date: 06/26/2015    Years since quitting: 5.1  . Smokeless tobacco: Never  Used  Substance and Sexual Activity  . Alcohol use: Yes    Alcohol/week: 0.0 standard drinks  . Drug use: No  . Sexual activity: Not on file  Other Topics Concern  . Not on file  Social History Narrative  . Not on file   Social Determinants of Health   Financial Resource Strain:   . Difficulty of Paying Living Expenses: Not on file  Food Insecurity:   . Worried About Charity fundraiser in the Last Year: Not on file  . Ran Out of Food in the Last Year: Not on file  Transportation Needs:   . Lack of Transportation (Medical): Not on file  . Lack of Transportation (Non-Medical): Not on file  Physical Activity:   . Days of Exercise per Week: Not on file  . Minutes of Exercise per Session: Not on file  Stress:   . Feeling of Stress : Not on file  Social Connections:   . Frequency of Communication with Friends and Family: Not on file  . Frequency of Social Gatherings with Friends and Family: Not on file  . Attends Religious Services: Not on file  . Active Member of Clubs or Organizations: Not on file  . Attends Archivist Meetings: Not on file  .  Marital Status: Not on file  Intimate Partner Violence:   . Fear of Current or Ex-Partner: Not on file  . Emotionally Abused: Not on file  . Physically Abused: Not on file  . Sexually Abused: Not on file    Outpatient Medications Prior to Visit  Medication Sig Dispense Refill  . acetaminophen (TYLENOL) 650 MG CR tablet Take 2 tablets (1,300 mg total) by mouth every 8 (eight) hours as needed for pain. 90 tablet 3  . allopurinol (ZYLOPRIM) 300 MG tablet Take 1 tablet (300 mg total) by mouth daily. 90 tablet 1  . atorvastatin (LIPITOR) 40 MG tablet Take 1 tablet by mouth once daily 90 tablet 3  . B Complex-C CAPS Take 1 capsule by mouth daily.    . Cholecalciferol 125 MCG (5000 UT) capsule Take 10,000 Units by mouth daily.    . DiphenhydrAMINE HCl (ALLERGY MED PO) Take by mouth.    . lamoTRIgine (LAMICTAL) 100 MG tablet Take  100 mg by mouth at bedtime.    . lamoTRIgine (LAMICTAL) 25 MG tablet Take 75 mg by mouth every morning.    . pantoprazole (PROTONIX) 40 MG tablet Take 40 mg by mouth daily.    Marland Kitchen doxazosin (CARDURA) 1 MG tablet Take 2 mg by mouth at bedtime.     No facility-administered medications prior to visit.    Allergies  Allergen Reactions  . Bee Venom Anaphylaxis    Hx of local rxn     ROS Review of Systems    Objective:    Physical Exam Constitutional:      Appearance: He is well-developed.  HENT:     Head: Normocephalic and atraumatic.  Cardiovascular:     Rate and Rhythm: Normal rate and regular rhythm.     Heart sounds: Normal heart sounds.  Pulmonary:     Effort: Pulmonary effort is normal.     Breath sounds: Normal breath sounds.  Musculoskeletal:     Comments: Trace edema bilaterally  Skin:    General: Skin is warm and dry.     Comments: Scattered bruising on the forearms.    Neurological:     Mental Status: He is alert and oriented to person, place, and time.  Psychiatric:        Behavior: Behavior normal.     BP (!) 112/55   Pulse 68   Ht 5\' 8"  (1.727 m)   Wt 169 lb (76.7 kg)   SpO2 100%   BMI 25.70 kg/m  Wt Readings from Last 3 Encounters:  08/26/20 169 lb (76.7 kg)  08/03/20 172 lb (78 kg)  07/23/20 168 lb 11.2 oz (76.5 kg)     There are no preventive care reminders to display for this patient.  There are no preventive care reminders to display for this patient.  Lab Results  Component Value Date   TSH 1.58 02/18/2019   Lab Results  Component Value Date   WBC 8.1 01/29/2019   HGB 13.0 (L) 01/29/2019   HCT 36.7 (L) 01/29/2019   MCV 89.3 01/29/2019   PLT 172 01/29/2019   Lab Results  Component Value Date   NA 140 02/17/2020   K 4.8 02/17/2020   CO2 28 02/17/2020   GLUCOSE 85 02/17/2020   BUN 37 (H) 02/17/2020   CREATININE 1.24 (H) 02/17/2020   BILITOT 0.4 08/19/2019   ALKPHOS 59 10/23/2016   AST 22 08/19/2019   ALT 18 08/19/2019    PROT 6.5 08/19/2019   ALBUMIN 4.2 10/23/2016  CALCIUM 9.3 02/17/2020   Lab Results  Component Value Date   CHOL 110 08/19/2019   Lab Results  Component Value Date   HDL 53 08/19/2019   Lab Results  Component Value Date   LDLCALC 23 08/19/2019   Lab Results  Component Value Date   TRIG 289 (H) 08/19/2019   Lab Results  Component Value Date   CHOLHDL 2.1 08/19/2019   Lab Results  Component Value Date   HGBA1C 4.6 02/17/2020      Assessment & Plan:   Problem List Items Addressed This Visit      Nervous and Auditory   Vascular dementia without behavioral disturbance ([place])    I am concerned that he may have had another stroke.  We will go ahead and work-up with MRI we will try to see if we can get that scheduled over the next couple days.      Relevant Orders   MR Brain W Wo Contrast     Genitourinary   CKD (chronic kidney disease) stage 3, GFR 30-59 ml/min (HCC) - Primary    Due to recheck renal function.      Relevant Orders   COMPLETE METABOLIC PANEL WITH GFR     Other   Gout    Able.  No recent flares or exacerbations.      Fluctuating blood pressure    Possibly secondary to pheochromocytoma-we will work on try to get him in with endocrinology little sooner his appointment is not till January 18.      Relevant Orders   CBC   COMPLETE METABOLIC PANEL WITH GFR   TSH   Urine Microalbumin w/creat. ratio   BPH associated with nocturia    Did need to adjust his dosing on the doxazosin he is actually been taking it twice a day since he was seen in the emergency department.  He was only given a 30-day prescription so we need to update his current prescription.  New onset today.       Other Visit Diagnoses    Tremor       Relevant Orders   CBC   COMPLETE METABOLIC PANEL WITH GFR   TSH   Urine Microalbumin w/creat. ratio   Bruising       Relevant Orders   INR/PT      Meds ordered this encounter  Medications  . doxazosin (CARDURA) 1 MG tablet     Sig: Take 2 tablets (2 mg total) by mouth 2 (two) times daily.    Dispense:  180 tablet    Refill:  1    Please cancel all other scripts for doxazosin on file    Follow-up: No follow-ups on file.    Beatrice Lecher, MD

## 2020-08-26 NOTE — Assessment & Plan Note (Signed)
Possibly secondary to pheochromocytoma-we will work on try to get him in with endocrinology little sooner his appointment is not till January 18.

## 2020-08-26 NOTE — Assessment & Plan Note (Signed)
Due to recheck renal function. 

## 2020-08-26 NOTE — Assessment & Plan Note (Signed)
Did need to adjust his dosing on the doxazosin he is actually been taking it twice a day since he was seen in the emergency department.  He was only given a 30-day prescription so we need to update his current prescription.  New onset today.

## 2020-08-26 NOTE — Assessment & Plan Note (Signed)
Able.  No recent flares or exacerbations.

## 2020-08-26 NOTE — Telephone Encounter (Signed)
Dr Madilyn Fireman    I called [facility] they are booking into Mid to Late January, I called [facility] they are booking into Late Jan to [place] and I tried [facility] here in [place] and they are booking into mid February.   Jenny Reichmann

## 2020-08-27 ENCOUNTER — Other Ambulatory Visit: Payer: Self-pay | Admitting: *Deleted

## 2020-08-27 DIAGNOSIS — D649 Anemia, unspecified: Secondary | ICD-10-CM

## 2020-08-27 LAB — COMPLETE METABOLIC PANEL WITH GFR
AG Ratio: 2 (calc) (ref 1.0–2.5)
ALT: 58 U/L — ABNORMAL HIGH (ref 9–46)
AST: 45 U/L — ABNORMAL HIGH (ref 10–35)
Albumin: 4 g/dL (ref 3.6–5.1)
Alkaline phosphatase (APISO): 67 U/L (ref 35–144)
BUN/Creatinine Ratio: 32 (calc) — ABNORMAL HIGH (ref 6–22)
BUN: 33 mg/dL — ABNORMAL HIGH (ref 7–25)
CO2: 25 mmol/L (ref 20–32)
Calcium: 9.4 mg/dL (ref 8.6–10.3)
Chloride: 105 mmol/L (ref 98–110)
Creat: 1.04 mg/dL (ref 0.70–1.18)
GFR, Est African American: 80 mL/min/{1.73_m2} (ref >=60)
GFR, Est Non African American: 69 mL/min/{1.73_m2} (ref >=60)
Globulin: 2 g/dL (calc) (ref 1.9–3.7)
Glucose, Bld: 61 mg/dL — ABNORMAL LOW (ref 65–99)
Potassium: 4.5 mmol/L (ref 3.5–5.3)
Sodium: 140 mmol/L (ref 135–146)
Total Bilirubin: 0.6 mg/dL (ref 0.2–1.2)
Total Protein: 6 g/dL — ABNORMAL LOW (ref 6.1–8.1)

## 2020-08-27 LAB — CBC
HCT: 35 % — ABNORMAL LOW (ref 38.5–50.0)
Hemoglobin: 12.2 g/dL — ABNORMAL LOW (ref 13.2–17.1)
MCH: 31.5 pg (ref 27.0–33.0)
MCHC: 34.9 g/dL (ref 32.0–36.0)
MCV: 90.4 fL (ref 80.0–100.0)
MPV: 10.2 fL (ref 7.5–12.5)
Platelets: 141 10*3/uL (ref 140–400)
RBC: 3.87 10*6/uL — ABNORMAL LOW (ref 4.20–5.80)
RDW: 13.9 % (ref 11.0–15.0)
WBC: 5.5 10*3/uL (ref 3.8–10.8)

## 2020-08-27 LAB — MICROALBUMIN / CREATININE URINE RATIO
Creatinine, Urine: 137 mg/dL (ref 20–320)
Microalb Creat Ratio: 4 mcg/mg creat (ref <30)
Microalb, Ur: 0.5 mg/dL

## 2020-08-27 LAB — TSH: TSH: 4.39 mIU/L (ref 0.40–4.50)

## 2020-08-27 LAB — PROTIME-INR
INR: 0.9
Prothrombin Time: 9.8 s (ref 9.0–11.5)

## 2020-08-28 DIAGNOSIS — K3189 Other diseases of stomach and duodenum: Secondary | ICD-10-CM | POA: Diagnosis not present

## 2020-08-28 DIAGNOSIS — I313 Pericardial effusion (noninflammatory): Secondary | ICD-10-CM | POA: Diagnosis not present

## 2020-08-28 DIAGNOSIS — G9341 Metabolic encephalopathy: Secondary | ICD-10-CM | POA: Diagnosis not present

## 2020-08-28 DIAGNOSIS — E162 Hypoglycemia, unspecified: Secondary | ICD-10-CM | POA: Diagnosis not present

## 2020-08-28 DIAGNOSIS — R140 Abdominal distension (gaseous): Secondary | ICD-10-CM | POA: Diagnosis not present

## 2020-08-28 DIAGNOSIS — K760 Fatty (change of) liver, not elsewhere classified: Secondary | ICD-10-CM | POA: Diagnosis not present

## 2020-08-28 DIAGNOSIS — J986 Disorders of diaphragm: Secondary | ICD-10-CM | POA: Diagnosis not present

## 2020-08-28 DIAGNOSIS — J9811 Atelectasis: Secondary | ICD-10-CM | POA: Diagnosis not present

## 2020-08-28 DIAGNOSIS — R079 Chest pain, unspecified: Secondary | ICD-10-CM | POA: Diagnosis not present

## 2020-08-28 DIAGNOSIS — R1084 Generalized abdominal pain: Secondary | ICD-10-CM | POA: Diagnosis not present

## 2020-08-28 DIAGNOSIS — N390 Urinary tract infection, site not specified: Secondary | ICD-10-CM | POA: Diagnosis not present

## 2020-08-28 DIAGNOSIS — J398 Other specified diseases of upper respiratory tract: Secondary | ICD-10-CM | POA: Diagnosis not present

## 2020-08-28 DIAGNOSIS — R680 Hypothermia, not associated with low environmental temperature: Secondary | ICD-10-CM | POA: Diagnosis not present

## 2020-08-28 DIAGNOSIS — N261 Atrophy of kidney (terminal): Secondary | ICD-10-CM | POA: Diagnosis not present

## 2020-08-28 DIAGNOSIS — R410 Disorientation, unspecified: Secondary | ICD-10-CM | POA: Diagnosis not present

## 2020-08-28 DIAGNOSIS — N400 Enlarged prostate without lower urinary tract symptoms: Secondary | ICD-10-CM | POA: Diagnosis not present

## 2020-08-28 DIAGNOSIS — I951 Orthostatic hypotension: Secondary | ICD-10-CM | POA: Diagnosis not present

## 2020-08-28 DIAGNOSIS — R1013 Epigastric pain: Secondary | ICD-10-CM | POA: Diagnosis not present

## 2020-08-28 DIAGNOSIS — F0390 Unspecified dementia without behavioral disturbance: Secondary | ICD-10-CM | POA: Diagnosis not present

## 2020-08-28 DIAGNOSIS — R0789 Other chest pain: Secondary | ICD-10-CM | POA: Diagnosis not present

## 2020-08-28 DIAGNOSIS — F319 Bipolar disorder, unspecified: Secondary | ICD-10-CM | POA: Diagnosis not present

## 2020-08-28 DIAGNOSIS — K5900 Constipation, unspecified: Secondary | ICD-10-CM | POA: Diagnosis not present

## 2020-08-28 DIAGNOSIS — R160 Hepatomegaly, not elsewhere classified: Secondary | ICD-10-CM | POA: Diagnosis not present

## 2020-08-28 DIAGNOSIS — T68XXXA Hypothermia, initial encounter: Secondary | ICD-10-CM | POA: Diagnosis not present

## 2020-08-30 ENCOUNTER — Encounter: Payer: Self-pay | Admitting: Family Medicine

## 2020-08-30 ENCOUNTER — Encounter: Payer: Self-pay | Admitting: Internal Medicine

## 2020-08-30 ENCOUNTER — Ambulatory Visit (INDEPENDENT_AMBULATORY_CARE_PROVIDER_SITE_OTHER): Payer: Medicare Other | Admitting: Internal Medicine

## 2020-08-30 ENCOUNTER — Ambulatory Visit (INDEPENDENT_AMBULATORY_CARE_PROVIDER_SITE_OTHER): Payer: Medicare Other

## 2020-08-30 ENCOUNTER — Other Ambulatory Visit: Payer: Self-pay

## 2020-08-30 ENCOUNTER — Telehealth: Payer: Self-pay | Admitting: Family Medicine

## 2020-08-30 VITALS — BP 108/72 | HR 69 | Ht 68.0 in | Wt 165.1 lb

## 2020-08-30 DIAGNOSIS — N261 Atrophy of kidney (terminal): Secondary | ICD-10-CM

## 2020-08-30 DIAGNOSIS — R748 Abnormal levels of other serum enzymes: Secondary | ICD-10-CM

## 2020-08-30 DIAGNOSIS — E162 Hypoglycemia, unspecified: Secondary | ICD-10-CM | POA: Diagnosis not present

## 2020-08-30 DIAGNOSIS — R4182 Altered mental status, unspecified: Secondary | ICD-10-CM | POA: Diagnosis not present

## 2020-08-30 DIAGNOSIS — F0150 Vascular dementia without behavioral disturbance: Secondary | ICD-10-CM

## 2020-08-30 DIAGNOSIS — S066X0A Traumatic subarachnoid hemorrhage without loss of consciousness, initial encounter: Secondary | ICD-10-CM | POA: Diagnosis not present

## 2020-08-30 DIAGNOSIS — R825 Elevated urine levels of drugs, medicaments and biological substances: Secondary | ICD-10-CM | POA: Diagnosis not present

## 2020-08-30 DIAGNOSIS — R251 Tremor, unspecified: Secondary | ICD-10-CM

## 2020-08-30 DIAGNOSIS — G9389 Other specified disorders of brain: Secondary | ICD-10-CM | POA: Diagnosis not present

## 2020-08-30 DIAGNOSIS — N1831 Chronic kidney disease, stage 3a: Secondary | ICD-10-CM

## 2020-08-30 DIAGNOSIS — I608 Other nontraumatic subarachnoid hemorrhage: Secondary | ICD-10-CM | POA: Diagnosis not present

## 2020-08-30 MED ORDER — GADOBUTROL 1 MMOL/ML IV SOLN
7.5000 mL | Freq: Once | INTRAVENOUS | Status: AC | PRN
  Administered 2020-08-30: 7.5 mL via INTRAVENOUS

## 2020-08-30 NOTE — Patient Instructions (Addendum)
-   Please make sure you are not using any over the counter decongestants - you will need to use condom Foley, will try and see if we can prescribe them     24-Hour Urine Collection   You will be collecting your urine for a 24-hour period of time.  Your timer starts with your first urine of the morning (For example - If you first pee at [place], your timer will start at [place])  [place] away your first urine of the morning  Collect your urine every time you pee for the next 24 hours STOP your urine collection 24 hours after you started the collection (For example - You would stop at 9AM the day after you started)

## 2020-08-30 NOTE — Telephone Encounter (Signed)
OK to place new referral

## 2020-08-30 NOTE — Telephone Encounter (Signed)
Patient's wife stopped by and stated the patient had been seen in [place] ED on Saturday night, didn't specify for what, just stated patient has dementia and that his blood sugar appeared to be 40 in the ED and wanted to let PCP know this. AM

## 2020-08-30 NOTE — Progress Notes (Signed)
Name: Andres Stevens  MRN/ DOB: [id number], [birth year]    Age/ Sex: 74 y.o., male    PCP: Hali Marry, MD   Reason for Endocrinology Evaluation: Elevated catcholamines     Date of Initial Endocrinology Evaluation: 08/30/2020     HPI: [name] is a 74 y.o. male with a past medical history of Dyslipidemia, bipolar disorder and history of hemorrhagic stroke, S/P parathyroidectomy and vascular dementia . The patient presented for initial endocrinology clinic visit on 08/30/2020 for consultative assistance with his Elevated catecholamine.   During evaluation for elevated BP readings at home in 07/2120 he was noted to have elevated plasma norepinephrine at 1973 pg/mL, and normetanephrine at 513 pg/ml but with normal epinephrine and metanephrines. His abdominal CT on 08/09/2020 was unrevealing.   He has been on doxazosin  since hemorrhagic stroke ~ 2016 . IN the past few weeks wife noted labile BP's. With SBP as high as 200 mg/dL mainly during the evening and lower in the morning. His Doxazosin dose has been increased to 2 mg and BP has been more manageable .  Of note , the pt is a poor historian and history was obtained  from wife      Headaches: no HTN: Yes Palpitations: occasional  Anxiety: yes - started 4 weeks ago  Sweaty, Clammy sensations:  no per wife but yes per pt  Abdominal Pain: yes   He is on Lamictal for at least 5 yrs, he is under the care of a specialist  He is on OTC allergy medicine but she does not think     Wife is also concerned about hypoglycemia. He had an ED visit 12/2 for abdominal/chest pain that was attributed to constipation , his serum glucose was 61 mg/dL. In review of care everywhere he had a glucose reading of 44 on 08/28/2020 but this seems to be a finger stick rather then a serum glucose.    HISTORY:  Past Medical History:  Past Medical History:  Diagnosis Date  . Bipolar 1 disorder ([place])   . Wears hearing aid    Past  Surgical History:  Past Surgical History:  Procedure Laterality Date  . CERVICAL FUSION    . PARATHYROIDECTOMY  03/2016      Social History:  reports that he quit smoking about 5 years ago. His smoking use included cigarettes. He smoked 0.25 packs per day. He has never used smokeless tobacco. He reports current alcohol use. He reports that he does not use drugs.  Family History: family history includes Depression in his father.   HOME MEDICATIONS: Allergies as of 08/30/2020      Reactions   Bee Venom Anaphylaxis   Hx of local rxn      Medication List       Accurate as of August 30, 2020  4:21 PM. If you have any questions, ask your nurse or doctor.        acetaminophen 650 MG CR tablet Commonly known as: TYLENOL Take 2 tablets (1,300 mg total) by mouth every 8 (eight) hours as needed for pain.   ALLERGY MED PO Take by mouth.   allopurinol 300 MG tablet Commonly known as: ZYLOPRIM Take 1 tablet (300 mg total) by mouth daily.   atorvastatin 40 MG tablet Commonly known as: LIPITOR Take 1 tablet by mouth once daily   B Complex-C Caps Take 1 capsule by mouth daily.   Cholecalciferol 125 MCG (5000 UT) capsule Take 10,000 Units by mouth daily.  doxazosin 1 MG tablet Commonly known as: CARDURA Take 2 tablets (2 mg total) by mouth 2 (two) times daily.   lamoTRIgine 100 MG tablet Commonly known as: LAMICTAL Take 100 mg by mouth at bedtime.   lamoTRIgine 25 MG tablet Commonly known as: LAMICTAL Take 75 mg by mouth every morning.   pantoprazole 40 MG tablet Commonly known as: PROTONIX Take 40 mg by mouth daily.         REVIEW OF SYSTEMS: A comprehensive ROS was conducted with the patient and is negative except as per HPI     OBJECTIVE:  VS: BP 108/72   Pulse 69   Ht 5\' 8"  (1.727 m)   Wt 165 lb 2 oz (74.9 kg)   SpO2 99%   BMI 25.11 kg/m    Wt Readings from Last 3 Encounters:  08/30/20 165 lb 2 oz (74.9 kg)  08/26/20 169 lb (76.7 kg)  08/03/20  172 lb (78 kg)     EXAM: General: Pt appears well and is in NAD  Neck: General: Supple without adenopathy. Thyroid: Thyroid size normal.  No goiter or nodules appreciated.  Lungs: Clear with good BS bilat with no rales, rhonchi, or wheezes  Heart: Auscultation: RRR.  Abdomen: Normoactive bowel sounds, soft, nontender, without masses or organomegaly palpable  Extremities:  BL LE: No pretibial edema normal ROM and strength.  Skin: Hair: Texture and amount normal with gender appropriate distribution Skin Inspection: No rashes Skin Palpation: Skin temperature, texture, and thickness normal to palpation  Neuro: Cranial nerves: II - XII grossly intact  Motor: Normal strength throughout DTRs: 2+ and symmetric in UE without delay in relaxation phase  Mental Status: Pt with dementia , unable to provide detailed history      DATA REVIEWED:     Plasma  24-hr urine  Epinephrine  Pg/mL < 20( Reference <95) 4 (2-24)  Norepinephrine  Pg/mL  1973    ( [phone]) 63 (15-100)   Metanephrine  Pg/mL 42 ( < 57)  179 (90-315)  Normetanephrine  Pg/mL  513   ( < 148) 882 (122-676)  Dopamine   Pg/mL 308  ( < 20) 192  (52-480)     Old records , labs and images have been reviewed.    ASSESSMENT/PLAN/RECOMMENDATIONS:   1. Elevated Catecholamine :   - In review of labs his plasma normetanephrine and dopamine are more then 2x upper limit of normal  And norepinephrine is less then 2x upper limit of normal, but his urinary norepinephrine and dopamine are normal with less then 2x the upper limit of urinary normetanephrines.    - Urinary catecholamine testing  have more specificity and sensitivity , plasma testing has a high false-positive risk , but since this has already been done will repeat both here - I also have  questionable  Accuracy with urinary collection. I have prescribed a condom foley to be used as the pt has incontinence issues.  - I also advised the wife to avoid all OTC decongestants  -  If testing reveals at least 2x elevation of upper limit of normal will proceed with imaging. Of note, the pt had a brain MRI and other then neck DJD , no evidence of mass/ paraganglioma , he also has a an abdominal CT which showed normal adrenal glands with  No evidence of enlarged lymph nodes or masses.  - Will consider MIBG is elevated   2. Low Glucose:  - In review of the chart , the pt had a  finger stick of 44 mg/dL through [employer]. No further testing was done at the time. His serum glucose was 61 mg/dL the prior day at [facility].   - I will advised the wife to obtain a [employer] glucose meter, and check glucose fasting and when symptomatic and to notify us if BG < 60 mg/dL.   F/U pending results     Signed electronically by: Mack Guise, MD  [facility]  [facility] [address] [place], [place] [address] Phone: [phone] FAX: [phone]   CC: Hali Marry, [address] [place] [address] Phone: [phone] Fax: [phone]   Return to Endocrinology clinic as below: No future appointments.

## 2020-08-31 ENCOUNTER — Encounter: Payer: Self-pay | Admitting: Internal Medicine

## 2020-08-31 ENCOUNTER — Telehealth: Payer: Self-pay | Admitting: Internal Medicine

## 2020-08-31 ENCOUNTER — Encounter: Payer: Self-pay | Admitting: Family Medicine

## 2020-08-31 DIAGNOSIS — J398 Other specified diseases of upper respiratory tract: Secondary | ICD-10-CM | POA: Diagnosis not present

## 2020-08-31 DIAGNOSIS — N1831 Chronic kidney disease, stage 3a: Secondary | ICD-10-CM | POA: Diagnosis not present

## 2020-08-31 DIAGNOSIS — R160 Hepatomegaly, not elsewhere classified: Secondary | ICD-10-CM | POA: Diagnosis not present

## 2020-08-31 DIAGNOSIS — R1084 Generalized abdominal pain: Secondary | ICD-10-CM | POA: Diagnosis not present

## 2020-08-31 DIAGNOSIS — R680 Hypothermia, not associated with low environmental temperature: Secondary | ICD-10-CM | POA: Diagnosis present

## 2020-08-31 DIAGNOSIS — R825 Elevated urine levels of drugs, medicaments and biological substances: Secondary | ICD-10-CM | POA: Diagnosis not present

## 2020-08-31 DIAGNOSIS — F429 Obsessive-compulsive disorder, unspecified: Secondary | ICD-10-CM | POA: Diagnosis present

## 2020-08-31 DIAGNOSIS — R2689 Other abnormalities of gait and mobility: Secondary | ICD-10-CM | POA: Diagnosis present

## 2020-08-31 DIAGNOSIS — I313 Pericardial effusion (noninflammatory): Secondary | ICD-10-CM | POA: Diagnosis not present

## 2020-08-31 DIAGNOSIS — J986 Disorders of diaphragm: Secondary | ICD-10-CM | POA: Diagnosis not present

## 2020-08-31 DIAGNOSIS — N400 Enlarged prostate without lower urinary tract symptoms: Secondary | ICD-10-CM | POA: Diagnosis not present

## 2020-08-31 DIAGNOSIS — J9811 Atelectasis: Secondary | ICD-10-CM | POA: Diagnosis not present

## 2020-08-31 DIAGNOSIS — M2550 Pain in unspecified joint: Secondary | ICD-10-CM | POA: Diagnosis not present

## 2020-08-31 DIAGNOSIS — R339 Retention of urine, unspecified: Secondary | ICD-10-CM | POA: Diagnosis present

## 2020-08-31 DIAGNOSIS — E161 Other hypoglycemia: Secondary | ICD-10-CM | POA: Diagnosis not present

## 2020-08-31 DIAGNOSIS — Z87891 Personal history of nicotine dependence: Secondary | ICD-10-CM | POA: Diagnosis not present

## 2020-08-31 DIAGNOSIS — D649 Anemia, unspecified: Secondary | ICD-10-CM | POA: Diagnosis not present

## 2020-08-31 DIAGNOSIS — I951 Orthostatic hypotension: Secondary | ICD-10-CM | POA: Diagnosis not present

## 2020-08-31 DIAGNOSIS — K760 Fatty (change of) liver, not elsewhere classified: Secondary | ICD-10-CM | POA: Diagnosis not present

## 2020-08-31 DIAGNOSIS — Z7401 Bed confinement status: Secondary | ICD-10-CM | POA: Diagnosis not present

## 2020-08-31 DIAGNOSIS — I959 Hypotension, unspecified: Secondary | ICD-10-CM | POA: Diagnosis present

## 2020-08-31 DIAGNOSIS — M6281 Muscle weakness (generalized): Secondary | ICD-10-CM | POA: Diagnosis present

## 2020-08-31 DIAGNOSIS — I619 Nontraumatic intracerebral hemorrhage, unspecified: Secondary | ICD-10-CM | POA: Diagnosis present

## 2020-08-31 DIAGNOSIS — T68XXXA Hypothermia, initial encounter: Secondary | ICD-10-CM | POA: Diagnosis not present

## 2020-08-31 DIAGNOSIS — E785 Hyperlipidemia, unspecified: Secondary | ICD-10-CM | POA: Diagnosis present

## 2020-08-31 DIAGNOSIS — R410 Disorientation, unspecified: Secondary | ICD-10-CM | POA: Diagnosis not present

## 2020-08-31 DIAGNOSIS — N281 Cyst of kidney, acquired: Secondary | ICD-10-CM | POA: Diagnosis not present

## 2020-08-31 DIAGNOSIS — B952 Enterococcus as the cause of diseases classified elsewhere: Secondary | ICD-10-CM | POA: Diagnosis not present

## 2020-08-31 DIAGNOSIS — Z8673 Personal history of transient ischemic attack (TIA), and cerebral infarction without residual deficits: Secondary | ICD-10-CM | POA: Diagnosis not present

## 2020-08-31 DIAGNOSIS — N183 Chronic kidney disease, stage 3 unspecified: Secondary | ICD-10-CM | POA: Diagnosis present

## 2020-08-31 DIAGNOSIS — D378 Neoplasm of uncertain behavior of other specified digestive organs: Secondary | ICD-10-CM | POA: Diagnosis present

## 2020-08-31 DIAGNOSIS — F319 Bipolar disorder, unspecified: Secondary | ICD-10-CM | POA: Diagnosis not present

## 2020-08-31 DIAGNOSIS — N182 Chronic kidney disease, stage 2 (mild): Secondary | ICD-10-CM | POA: Diagnosis not present

## 2020-08-31 DIAGNOSIS — E162 Hypoglycemia, unspecified: Secondary | ICD-10-CM | POA: Diagnosis not present

## 2020-08-31 DIAGNOSIS — G8929 Other chronic pain: Secondary | ICD-10-CM | POA: Diagnosis present

## 2020-08-31 DIAGNOSIS — Z20822 Contact with and (suspected) exposure to covid-19: Secondary | ICD-10-CM | POA: Diagnosis present

## 2020-08-31 DIAGNOSIS — F0150 Vascular dementia without behavioral disturbance: Secondary | ICD-10-CM | POA: Diagnosis not present

## 2020-08-31 DIAGNOSIS — E210 Primary hyperparathyroidism: Secondary | ICD-10-CM | POA: Diagnosis present

## 2020-08-31 DIAGNOSIS — N179 Acute kidney failure, unspecified: Secondary | ICD-10-CM | POA: Diagnosis not present

## 2020-08-31 DIAGNOSIS — R2681 Unsteadiness on feet: Secondary | ICD-10-CM | POA: Diagnosis present

## 2020-08-31 DIAGNOSIS — K2289 Other specified disease of esophagus: Secondary | ICD-10-CM | POA: Diagnosis not present

## 2020-08-31 DIAGNOSIS — K4020 Bilateral inguinal hernia, without obstruction or gangrene, not specified as recurrent: Secondary | ICD-10-CM | POA: Diagnosis not present

## 2020-08-31 DIAGNOSIS — R52 Pain, unspecified: Secondary | ICD-10-CM | POA: Diagnosis not present

## 2020-08-31 DIAGNOSIS — R140 Abdominal distension (gaseous): Secondary | ICD-10-CM | POA: Diagnosis not present

## 2020-08-31 DIAGNOSIS — I129 Hypertensive chronic kidney disease with stage 1 through stage 4 chronic kidney disease, or unspecified chronic kidney disease: Secondary | ICD-10-CM | POA: Diagnosis present

## 2020-08-31 DIAGNOSIS — R404 Transient alteration of awareness: Secondary | ICD-10-CM | POA: Diagnosis not present

## 2020-08-31 DIAGNOSIS — Z781 Physical restraint status: Secondary | ICD-10-CM | POA: Diagnosis not present

## 2020-08-31 DIAGNOSIS — N390 Urinary tract infection, site not specified: Secondary | ICD-10-CM | POA: Diagnosis not present

## 2020-08-31 DIAGNOSIS — R413 Other amnesia: Secondary | ICD-10-CM | POA: Diagnosis present

## 2020-08-31 DIAGNOSIS — Z91030 Bee allergy status: Secondary | ICD-10-CM | POA: Diagnosis not present

## 2020-08-31 DIAGNOSIS — N3000 Acute cystitis without hematuria: Secondary | ICD-10-CM | POA: Diagnosis present

## 2020-08-31 DIAGNOSIS — I998 Other disorder of circulatory system: Secondary | ICD-10-CM | POA: Diagnosis not present

## 2020-08-31 DIAGNOSIS — M545 Low back pain, unspecified: Secondary | ICD-10-CM | POA: Diagnosis present

## 2020-08-31 DIAGNOSIS — G9341 Metabolic encephalopathy: Secondary | ICD-10-CM | POA: Diagnosis not present

## 2020-08-31 DIAGNOSIS — K219 Gastro-esophageal reflux disease without esophagitis: Secondary | ICD-10-CM | POA: Diagnosis not present

## 2020-08-31 DIAGNOSIS — G9340 Encephalopathy, unspecified: Secondary | ICD-10-CM | POA: Diagnosis not present

## 2020-08-31 DIAGNOSIS — D696 Thrombocytopenia, unspecified: Secondary | ICD-10-CM | POA: Diagnosis not present

## 2020-08-31 DIAGNOSIS — N261 Atrophy of kidney (terminal): Secondary | ICD-10-CM | POA: Insufficient documentation

## 2020-08-31 NOTE — Telephone Encounter (Signed)
FYI. Medication have been updated to current medication list

## 2020-08-31 NOTE — Telephone Encounter (Signed)
Patient's wife Arbie Cookey called to let Dr. Kelton Pillar know the type of allergy medication patient takes is Cetirizine Hydrochloride (Antihistimine).

## 2020-08-31 NOTE — Telephone Encounter (Signed)
Okay.  Per endocrine note she will be getting a glucometer.

## 2020-09-02 DIAGNOSIS — E162 Hypoglycemia, unspecified: Secondary | ICD-10-CM | POA: Diagnosis not present

## 2020-09-02 DIAGNOSIS — G9341 Metabolic encephalopathy: Secondary | ICD-10-CM | POA: Diagnosis not present

## 2020-09-03 DIAGNOSIS — T68XXXA Hypothermia, initial encounter: Secondary | ICD-10-CM | POA: Diagnosis not present

## 2020-09-03 DIAGNOSIS — E162 Hypoglycemia, unspecified: Secondary | ICD-10-CM | POA: Diagnosis not present

## 2020-09-03 DIAGNOSIS — G9341 Metabolic encephalopathy: Secondary | ICD-10-CM | POA: Diagnosis not present

## 2020-09-04 DIAGNOSIS — E162 Hypoglycemia, unspecified: Secondary | ICD-10-CM | POA: Diagnosis not present

## 2020-09-05 DIAGNOSIS — E162 Hypoglycemia, unspecified: Secondary | ICD-10-CM | POA: Diagnosis not present

## 2020-09-06 DIAGNOSIS — E162 Hypoglycemia, unspecified: Secondary | ICD-10-CM | POA: Diagnosis not present

## 2020-09-06 DIAGNOSIS — T68XXXA Hypothermia, initial encounter: Secondary | ICD-10-CM | POA: Diagnosis not present

## 2020-09-06 LAB — CATECHOLAMINES, FRACTIONATED, PLASMA
Dopamine: 67 pg/mL — ABNORMAL HIGH (ref 0–48)
Epinephrine: 165 pg/mL — ABNORMAL HIGH (ref 0–62)
Norepinephrine: 1211 pg/mL — ABNORMAL HIGH (ref 0–874)

## 2020-09-06 LAB — METANEPHRINES, PLASMA
Metanephrine, Free: 44 pg/mL (ref 0.0–88.0)
Normetanephrine, Free: 165 pg/mL (ref 0.0–285.2)

## 2020-09-14 DIAGNOSIS — B952 Enterococcus as the cause of diseases classified elsewhere: Secondary | ICD-10-CM | POA: Insufficient documentation

## 2020-09-14 DIAGNOSIS — N390 Urinary tract infection, site not specified: Secondary | ICD-10-CM | POA: Insufficient documentation

## 2020-09-15 DIAGNOSIS — N179 Acute kidney failure, unspecified: Secondary | ICD-10-CM | POA: Diagnosis not present

## 2020-09-15 DIAGNOSIS — R404 Transient alteration of awareness: Secondary | ICD-10-CM | POA: Diagnosis not present

## 2020-09-15 DIAGNOSIS — K219 Gastro-esophageal reflux disease without esophagitis: Secondary | ICD-10-CM | POA: Diagnosis not present

## 2020-09-15 DIAGNOSIS — Z7401 Bed confinement status: Secondary | ICD-10-CM | POA: Diagnosis not present

## 2020-09-15 DIAGNOSIS — M6281 Muscle weakness (generalized): Secondary | ICD-10-CM | POA: Diagnosis present

## 2020-09-15 DIAGNOSIS — R410 Disorientation, unspecified: Secondary | ICD-10-CM | POA: Diagnosis not present

## 2020-09-15 DIAGNOSIS — N182 Chronic kidney disease, stage 2 (mild): Secondary | ICD-10-CM | POA: Diagnosis not present

## 2020-09-15 DIAGNOSIS — G9341 Metabolic encephalopathy: Secondary | ICD-10-CM | POA: Diagnosis present

## 2020-09-15 DIAGNOSIS — N3000 Acute cystitis without hematuria: Secondary | ICD-10-CM | POA: Diagnosis present

## 2020-09-15 DIAGNOSIS — F422 Mixed obsessional thoughts and acts: Secondary | ICD-10-CM | POA: Diagnosis not present

## 2020-09-15 DIAGNOSIS — E785 Hyperlipidemia, unspecified: Secondary | ICD-10-CM | POA: Diagnosis not present

## 2020-09-15 DIAGNOSIS — R825 Elevated urine levels of drugs, medicaments and biological substances: Secondary | ICD-10-CM | POA: Diagnosis present

## 2020-09-15 DIAGNOSIS — N390 Urinary tract infection, site not specified: Secondary | ICD-10-CM | POA: Diagnosis not present

## 2020-09-15 DIAGNOSIS — I998 Other disorder of circulatory system: Secondary | ICD-10-CM | POA: Diagnosis present

## 2020-09-15 DIAGNOSIS — Z8673 Personal history of transient ischemic attack (TIA), and cerebral infarction without residual deficits: Secondary | ICD-10-CM | POA: Diagnosis not present

## 2020-09-15 DIAGNOSIS — F419 Anxiety disorder, unspecified: Secondary | ICD-10-CM | POA: Diagnosis not present

## 2020-09-15 DIAGNOSIS — G8929 Other chronic pain: Secondary | ICD-10-CM | POA: Diagnosis present

## 2020-09-15 DIAGNOSIS — M545 Low back pain, unspecified: Secondary | ICD-10-CM | POA: Diagnosis present

## 2020-09-15 DIAGNOSIS — R2681 Unsteadiness on feet: Secondary | ICD-10-CM | POA: Diagnosis present

## 2020-09-15 DIAGNOSIS — F0150 Vascular dementia without behavioral disturbance: Secondary | ICD-10-CM | POA: Diagnosis present

## 2020-09-15 DIAGNOSIS — E210 Primary hyperparathyroidism: Secondary | ICD-10-CM | POA: Diagnosis present

## 2020-09-15 DIAGNOSIS — B952 Enterococcus as the cause of diseases classified elsewhere: Secondary | ICD-10-CM | POA: Diagnosis present

## 2020-09-15 DIAGNOSIS — N183 Chronic kidney disease, stage 3 unspecified: Secondary | ICD-10-CM | POA: Diagnosis not present

## 2020-09-15 DIAGNOSIS — E162 Hypoglycemia, unspecified: Secondary | ICD-10-CM | POA: Diagnosis present

## 2020-09-15 DIAGNOSIS — F3131 Bipolar disorder, current episode depressed, mild: Secondary | ICD-10-CM | POA: Diagnosis not present

## 2020-09-15 DIAGNOSIS — F429 Obsessive-compulsive disorder, unspecified: Secondary | ICD-10-CM | POA: Diagnosis present

## 2020-09-15 DIAGNOSIS — R413 Other amnesia: Secondary | ICD-10-CM | POA: Diagnosis present

## 2020-09-15 DIAGNOSIS — F319 Bipolar disorder, unspecified: Secondary | ICD-10-CM | POA: Diagnosis present

## 2020-09-15 DIAGNOSIS — N400 Enlarged prostate without lower urinary tract symptoms: Secondary | ICD-10-CM | POA: Diagnosis present

## 2020-09-15 DIAGNOSIS — I619 Nontraumatic intracerebral hemorrhage, unspecified: Secondary | ICD-10-CM | POA: Diagnosis present

## 2020-09-15 DIAGNOSIS — M2550 Pain in unspecified joint: Secondary | ICD-10-CM | POA: Diagnosis not present

## 2020-09-15 DIAGNOSIS — D649 Anemia, unspecified: Secondary | ICD-10-CM | POA: Diagnosis not present

## 2020-09-16 DIAGNOSIS — N400 Enlarged prostate without lower urinary tract symptoms: Secondary | ICD-10-CM | POA: Diagnosis not present

## 2020-09-16 DIAGNOSIS — N183 Chronic kidney disease, stage 3 unspecified: Secondary | ICD-10-CM | POA: Diagnosis not present

## 2020-09-16 DIAGNOSIS — F0150 Vascular dementia without behavioral disturbance: Secondary | ICD-10-CM | POA: Diagnosis not present

## 2020-09-16 DIAGNOSIS — N390 Urinary tract infection, site not specified: Secondary | ICD-10-CM | POA: Diagnosis not present

## 2020-09-21 ENCOUNTER — Encounter: Payer: Self-pay | Admitting: Family Medicine

## 2020-10-04 DIAGNOSIS — F319 Bipolar disorder, unspecified: Secondary | ICD-10-CM | POA: Diagnosis not present

## 2020-10-04 DIAGNOSIS — F0150 Vascular dementia without behavioral disturbance: Secondary | ICD-10-CM | POA: Diagnosis not present

## 2020-10-04 DIAGNOSIS — N400 Enlarged prostate without lower urinary tract symptoms: Secondary | ICD-10-CM | POA: Diagnosis not present

## 2020-10-04 DIAGNOSIS — E785 Hyperlipidemia, unspecified: Secondary | ICD-10-CM | POA: Diagnosis not present

## 2020-10-08 DIAGNOSIS — F419 Anxiety disorder, unspecified: Secondary | ICD-10-CM | POA: Diagnosis not present

## 2020-10-08 DIAGNOSIS — F3131 Bipolar disorder, current episode depressed, mild: Secondary | ICD-10-CM | POA: Diagnosis not present

## 2020-10-08 DIAGNOSIS — F422 Mixed obsessional thoughts and acts: Secondary | ICD-10-CM | POA: Diagnosis not present

## 2020-10-11 ENCOUNTER — Encounter: Payer: Self-pay | Admitting: Family Medicine

## 2020-10-13 DIAGNOSIS — N400 Enlarged prostate without lower urinary tract symptoms: Secondary | ICD-10-CM | POA: Diagnosis not present

## 2020-10-13 DIAGNOSIS — N183 Chronic kidney disease, stage 3 unspecified: Secondary | ICD-10-CM | POA: Diagnosis not present

## 2020-10-13 DIAGNOSIS — F0150 Vascular dementia without behavioral disturbance: Secondary | ICD-10-CM | POA: Diagnosis not present

## 2020-10-13 NOTE — Telephone Encounter (Signed)
Okay, sounds good lets try to get him scheduled the schedule for next week for hospital follow-up.

## 2020-10-13 NOTE — Telephone Encounter (Signed)
See note from [facility], needs visit.

## 2020-10-14 NOTE — Telephone Encounter (Signed)
Appt has been scheduled for 10/26/20. AM

## 2020-10-15 ENCOUNTER — Telehealth: Payer: Self-pay

## 2020-10-15 NOTE — Telephone Encounter (Signed)
Andres Stevens's wife called and states the Diazoxide 50 mg 3.1 ml every 12 hours is out of stock at [facility]. Also the cost is a couple of thousands of dollars. I looked it up and at [name] with [facility] is only $94. She will call back if [name] is out of stock.

## 2020-10-18 ENCOUNTER — Encounter: Payer: Self-pay | Admitting: Family Medicine

## 2020-10-18 ENCOUNTER — Other Ambulatory Visit: Payer: Self-pay

## 2020-10-18 ENCOUNTER — Ambulatory Visit (INDEPENDENT_AMBULATORY_CARE_PROVIDER_SITE_OTHER): Payer: Medicare Other | Admitting: Family Medicine

## 2020-10-18 VITALS — BP 111/46 | HR 75 | Ht 68.0 in | Wt 166.0 lb

## 2020-10-18 DIAGNOSIS — Z5181 Encounter for therapeutic drug level monitoring: Secondary | ICD-10-CM | POA: Diagnosis not present

## 2020-10-18 DIAGNOSIS — E162 Hypoglycemia, unspecified: Secondary | ICD-10-CM

## 2020-10-18 DIAGNOSIS — F3170 Bipolar disorder, currently in remission, most recent episode unspecified: Secondary | ICD-10-CM

## 2020-10-18 DIAGNOSIS — N1831 Chronic kidney disease, stage 3a: Secondary | ICD-10-CM | POA: Diagnosis not present

## 2020-10-18 DIAGNOSIS — R748 Abnormal levels of other serum enzymes: Secondary | ICD-10-CM | POA: Diagnosis not present

## 2020-10-18 DIAGNOSIS — R351 Nocturia: Secondary | ICD-10-CM

## 2020-10-18 DIAGNOSIS — N401 Enlarged prostate with lower urinary tract symptoms: Secondary | ICD-10-CM | POA: Diagnosis not present

## 2020-10-18 NOTE — Assessment & Plan Note (Signed)
Plan to recheck renal function after his hospitalization.

## 2020-10-18 NOTE — Assessment & Plan Note (Signed)
He want to try to hold the Flomax to see if he does well without it.  I think that is perfectly reasonable.  If he notices significant slowdown in urination he can always restart it.

## 2020-10-18 NOTE — Assessment & Plan Note (Signed)
They are to follow-up with neurology first and then follow-up psychiatry.  Hopefully he will be able to go back to just his baseline medication he actually did really well on it and has for some time up until recently when all of this happened to really think he is probably little overmedicated at this point.

## 2020-10-18 NOTE — Progress Notes (Signed)
Established Patient Office Visit  Subjective:  Patient ID: Andres Stevens, male    DOB: [birth year]  Age: 75 y.o. MRN: [id number]  CC:  Chief Complaint  Patient presents with  . Hospitalization Follow-up    HPI Andres Stevens presents for Hospital follow-up.  When I last saw him he has started having hypertensive episodes and hypoglycemia.  In fact have referred him to endocrinology and had initial consult with Dr. Gayla Medicus for.  Unfortunately his symptoms became more pronounced and so he went to the emergency department at [facility].  He was admitted on 12 7 and then discharged home on December 22, hospital length of stay was 16.  He was diagnosed with elevated urine catecholamines as well as fluctuating blood pressures while there.  He was started on diazoxide 3.1 mL twice a day.  His wife had has had significant difficulty finding the prescription its been quite expensive with insurance but was able to get it with good Rx.  He just charted it back 3 days ago he was on it in the SNF facility that he was discharged to.  He has been able to maintain his glucose levels since being on the medication his wife did call the insurance company and they gave 3 recommendations for other options that he might be able to use instead.  His wife also wants to know if he should continue the Flomax or not as well as the MiraLAX.  He is urinating really well and having at least 3 bowel movements a day.  He was not on these medications before admission.  He was actually scheduled to have a upper endoscopy as he was getting a lot of distention and bloating especially at night some satiety.  His wife wants to know if she should reschedule that or not.  Does have an appoint with podiatry tomorrow for some discolored nails.  Past Medical History:  Diagnosis Date  . Bipolar 1 disorder ([place])   . Wears hearing aid     Past Surgical History:  Procedure Laterality Date  . CERVICAL FUSION    .  PARATHYROIDECTOMY  03/2016    Family History  Problem Relation Age of Onset  . Depression Father     Social History   Socioeconomic History  . Marital status: Married    Spouse name: Not on file  . Number of children: Not on file  . Years of education: Not on file  . Highest education level: Not on file  Occupational History  . Not on file  Tobacco Use  . Smoking status: Former Smoker    Packs/day: 0.25    Types: Cigarettes    Quit date: 06/26/2015    Years since quitting: 5.3  . Smokeless tobacco: Never Used  Substance and Sexual Activity  . Alcohol use: Yes    Alcohol/week: 0.0 standard drinks  . Drug use: No  . Sexual activity: Not on file  Other Topics Concern  . Not on file  Social History Narrative  . Not on file   Social Determinants of Health   Financial Resource Strain: Not on file  Food Insecurity: Not on file  Transportation Needs: Not on file  Physical Activity: Not on file  Stress: Not on file  Social Connections: Not on file  Intimate Partner Violence: Not on file    Outpatient Medications Prior to Visit  Medication Sig Dispense Refill  . allopurinol (ZYLOPRIM) 300 MG tablet Take 1 tablet (300 mg total) by mouth daily. 90 tablet  1  . atorvastatin (LIPITOR) 40 MG tablet Take 1 tablet by mouth once daily 90 tablet 3  . diazoxide (PROGLYCEM) 50 MG/ML suspension Take 3.1 mLs by mouth in the morning and at bedtime.    . divalproex (DEPAKOTE) 250 MG DR tablet Take 1 tablet by mouth in the morning, at noon, and at bedtime.    . lamoTRIgine (LAMICTAL) 100 MG tablet Take 100 mg by mouth at bedtime.    Marland Kitchen OLANZapine (ZYPREXA) 2.5 MG tablet Take 1 tablet by mouth as needed. Take with 5 mg UNLESS SEDATED    . OLANZapine (ZYPREXA) 5 MG tablet Take 1 tablet by mouth at bedtime.    . pantoprazole (PROTONIX) 40 MG tablet Take 40 mg by mouth daily.    . tamsulosin (FLOMAX) 0.4 MG CAPS capsule Take by mouth.    Marland Kitchen acetaminophen (TYLENOL) 650 MG CR tablet Take 2  tablets (1,300 mg total) by mouth every 8 (eight) hours as needed for pain. 90 tablet 3  . B Complex-C CAPS Take 1 capsule by mouth daily.    Marland Kitchen CETIRIZINE HCL PO Take 1 tablet by mouth daily.    . Cholecalciferol 125 MCG (5000 UT) capsule Take 10,000 Units by mouth daily.    . DiphenhydrAMINE HCl (ALLERGY MED PO) Take by mouth.    . doxazosin (CARDURA) 1 MG tablet Take 2 tablets (2 mg total) by mouth 2 (two) times daily. 180 tablet 1  . lamoTRIgine (LAMICTAL) 25 MG tablet Take 75 mg by mouth every morning.     No facility-administered medications prior to visit.    Allergies  Allergen Reactions  . Bee Venom Anaphylaxis    Hx of local rxn     ROS Review of Systems    Objective:    Physical Exam Constitutional:      Appearance: He is well-developed and well-nourished.  HENT:     Head: Normocephalic and atraumatic.  Cardiovascular:     Rate and Rhythm: Normal rate and regular rhythm.     Heart sounds: Normal heart sounds.  Pulmonary:     Effort: Pulmonary effort is normal.     Breath sounds: Normal breath sounds.  Skin:    General: Skin is warm and dry.  Neurological:     Mental Status: He is alert and oriented to person, place, and time.  Psychiatric:        Mood and Affect: Mood and affect normal.        Behavior: Behavior normal.     BP (!) 111/46   Pulse 75   Ht 5\' 8"  (1.727 m)   Wt 166 lb (75.3 kg)   SpO2 98%   BMI 25.24 kg/m  Wt Readings from Last 3 Encounters:  10/18/20 166 lb (75.3 kg)  08/30/20 165 lb 2 oz (74.9 kg)  08/26/20 169 lb (76.7 kg)     There are no preventive care reminders to display for this patient.  There are no preventive care reminders to display for this patient.  Lab Results  Component Value Date   TSH 4.39 08/26/2020   Lab Results  Component Value Date   WBC 5.5 08/26/2020   HGB 12.2 (L) 08/26/2020   HCT 35.0 (L) 08/26/2020   MCV 90.4 08/26/2020   PLT 141 08/26/2020   Lab Results  Component Value Date   NA 140  10/18/2020   K 4.7 10/18/2020   CO2 30 10/18/2020   GLUCOSE 92 10/18/2020   BUN 28 (H) 10/18/2020   CREATININE  1.13 10/18/2020   BILITOT 0.4 10/18/2020   ALKPHOS 59 10/23/2016   AST 17 10/18/2020   ALT 11 10/18/2020   PROT 6.5 10/18/2020   ALBUMIN 4.2 10/23/2016   CALCIUM 9.3 10/18/2020   Lab Results  Component Value Date   CHOL 110 08/19/2019   Lab Results  Component Value Date   HDL 53 08/19/2019   Lab Results  Component Value Date   LDLCALC 23 08/19/2019   Lab Results  Component Value Date   TRIG 289 (H) 08/19/2019   Lab Results  Component Value Date   CHOLHDL 2.1 08/19/2019   Lab Results  Component Value Date   HGBA1C 4.6 02/17/2020      Assessment & Plan:   Problem List Items Addressed This Visit      Endocrine   Hypoglycemia    Unclear etiology.  They were unable to find any type of insulinoma in the hospital.  But he is doing well on his current regimen on Diazoxide 3.1 ml BI.  So far he is doing well.  He is eating regularly.  His insurance company gave him 3 different options to acutely treat hypoglycemia but nothing similar to the diazoxide.  I would encourage him to try to stick with the current medication if he can get it through good Rx.  The cheapest price would be at [facility] but they do not have it in stock so encouraged his wife to check to see if they might be able to order it and get it in time for his next refill and we can always send the prescription there.  Also encouraged him to follow-up with Dr. Kelton Pillar, endocrinology.        Genitourinary   CKD (chronic kidney disease) stage 3, GFR 30-59 ml/min (HCC)    Plan to recheck renal function after his hospitalization.        Relevant Orders   Urine Culture (Completed)     Other   BPH associated with nocturia    He want to try to hold the Flomax to see if he does well without it.  I think that is perfectly reasonable.  If he notices significant slowdown in urination he can always  restart it.      Relevant Orders   Urine Culture (Completed)   Bipolar disorder ([place])    They are to follow-up with neurology first and then follow-up psychiatry.  Hopefully he will be able to go back to just his baseline medication he actually did really well on it and has for some time up until recently when all of this happened to really think he is probably little overmedicated at this point.         Other Visit Diagnoses    Medication monitoring encounter    -  Primary   Relevant Orders   COMPLETE METABOLIC PANEL WITH GFR (Completed)   Valproic Acid level   Lipase (Completed)   Elevated lipase       Relevant Orders   Lipase (Completed)    \ Medication monitoring-due to recheck Depakote levels as well as check liver enzymes since starting the new medication as far as his wife is aware no one has rechecked that.  They are to follow-up with neurology first and then follow-up psychiatry.  Hopefully he will be able to go back to just his baseline medication he actually did really well on it and has for some time up until recently when all of this happened to really think  he is probably little overmedicated at this point.  No orders of the defined types were placed in this encounter.   Follow-up: No follow-ups on file.     Beatrice Lecher, MD

## 2020-10-18 NOTE — Assessment & Plan Note (Addendum)
Unclear etiology.  They were unable to find any type of insulinoma in the hospital.  But he is doing well on his current regimen on Diazoxide 3.1 ml BI.  So far he is doing well.  He is eating regularly.  His insurance company gave him 3 different options to acutely treat hypoglycemia but nothing similar to the diazoxide.  I would encourage him to try to stick with the current medication if he can get it through good Rx.  The cheapest price would be at [facility] but they do not have it in stock so encouraged his wife to check to see if they might be able to order it and get it in time for his next refill and we can always send the prescription there.  Also encouraged him to follow-up with Dr. Kelton Pillar, endocrinology.

## 2020-10-18 NOTE — Patient Instructions (Addendum)
OK to stop the flomax and miralax.    I think it would be worth checking with [name] to see if they might be able to order the Diazoxide for now.

## 2020-10-19 ENCOUNTER — Ambulatory Visit (INDEPENDENT_AMBULATORY_CARE_PROVIDER_SITE_OTHER): Payer: Medicare Other | Admitting: Podiatry

## 2020-10-19 ENCOUNTER — Telehealth: Payer: Self-pay | Admitting: Internal Medicine

## 2020-10-19 DIAGNOSIS — B351 Tinea unguium: Secondary | ICD-10-CM

## 2020-10-19 DIAGNOSIS — M79675 Pain in left toe(s): Secondary | ICD-10-CM | POA: Diagnosis not present

## 2020-10-19 DIAGNOSIS — M79674 Pain in right toe(s): Secondary | ICD-10-CM

## 2020-10-19 LAB — COMPLETE METABOLIC PANEL WITH GFR
AG Ratio: 2.1 (calc) (ref 1.0–2.5)
ALT: 11 U/L (ref 9–46)
AST: 17 U/L (ref 10–35)
Albumin: 4.4 g/dL (ref 3.6–5.1)
Alkaline phosphatase (APISO): 70 U/L (ref 35–144)
BUN/Creatinine Ratio: 25 (calc) — ABNORMAL HIGH (ref 6–22)
BUN: 28 mg/dL — ABNORMAL HIGH (ref 7–25)
CO2: 30 mmol/L (ref 20–32)
Calcium: 9.3 mg/dL (ref 8.6–10.3)
Chloride: 103 mmol/L (ref 98–110)
Creat: 1.13 mg/dL (ref 0.70–1.18)
GFR, Est African American: 73 mL/min/{1.73_m2} (ref >=60)
GFR, Est Non African American: 63 mL/min/{1.73_m2} (ref >=60)
Globulin: 2.1 g/dL (calc) (ref 1.9–3.7)
Glucose, Bld: 92 mg/dL (ref 65–99)
Potassium: 4.7 mmol/L (ref 3.5–5.3)
Sodium: 140 mmol/L (ref 135–146)
Total Bilirubin: 0.4 mg/dL (ref 0.2–1.2)
Total Protein: 6.5 g/dL (ref 6.1–8.1)

## 2020-10-19 LAB — LIPASE: Lipase: 78 U/L — ABNORMAL HIGH (ref 7–60)

## 2020-10-19 NOTE — Telephone Encounter (Signed)
Pt's wife called to make an appt now that he is out of the hospital and nursing home. She wanted Dr. Kelton Pillar to know pt's PCP requested she look at pt's scans from [facility] from Dec [facility]-21.  FYI

## 2020-10-19 NOTE — Telephone Encounter (Signed)
Please schedule the pt for a follow up with me to discuss results and recent hospitalization    Please encourage wife to obtain 24 hr urine collection that he was supposed to do prior to hospitalization.     Thanks    Abby Nena Jordan, MD  [facility] Endocrinology  [facility] [address] [place], [place] [address] Phone: [phone] FAX: [phone]

## 2020-10-19 NOTE — Telephone Encounter (Signed)
Spoke with wife and booked follow up appt for 10/21/20. She mentioned it would be very hard for her to do a urine collection due to his dementia worsening and asked if it was necessary?

## 2020-10-19 NOTE — Telephone Encounter (Signed)
Please see below.

## 2020-10-20 ENCOUNTER — Other Ambulatory Visit: Payer: Self-pay

## 2020-10-20 DIAGNOSIS — N183 Chronic kidney disease, stage 3 unspecified: Secondary | ICD-10-CM | POA: Diagnosis not present

## 2020-10-20 DIAGNOSIS — K760 Fatty (change of) liver, not elsewhere classified: Secondary | ICD-10-CM | POA: Diagnosis not present

## 2020-10-20 DIAGNOSIS — N401 Enlarged prostate with lower urinary tract symptoms: Secondary | ICD-10-CM | POA: Diagnosis not present

## 2020-10-20 DIAGNOSIS — I129 Hypertensive chronic kidney disease with stage 1 through stage 4 chronic kidney disease, or unspecified chronic kidney disease: Secondary | ICD-10-CM | POA: Diagnosis not present

## 2020-10-20 DIAGNOSIS — J9811 Atelectasis: Secondary | ICD-10-CM | POA: Diagnosis not present

## 2020-10-20 DIAGNOSIS — Z9181 History of falling: Secondary | ICD-10-CM | POA: Diagnosis not present

## 2020-10-20 DIAGNOSIS — E785 Hyperlipidemia, unspecified: Secondary | ICD-10-CM | POA: Diagnosis not present

## 2020-10-20 DIAGNOSIS — Z8673 Personal history of transient ischemic attack (TIA), and cerebral infarction without residual deficits: Secondary | ICD-10-CM | POA: Diagnosis not present

## 2020-10-20 DIAGNOSIS — F0150 Vascular dementia without behavioral disturbance: Secondary | ICD-10-CM | POA: Diagnosis not present

## 2020-10-20 DIAGNOSIS — R351 Nocturia: Secondary | ICD-10-CM | POA: Diagnosis not present

## 2020-10-20 DIAGNOSIS — I959 Hypotension, unspecified: Secondary | ICD-10-CM | POA: Diagnosis not present

## 2020-10-20 DIAGNOSIS — K4090 Unilateral inguinal hernia, without obstruction or gangrene, not specified as recurrent: Secondary | ICD-10-CM | POA: Diagnosis not present

## 2020-10-20 DIAGNOSIS — M5134 Other intervertebral disc degeneration, thoracic region: Secondary | ICD-10-CM | POA: Diagnosis not present

## 2020-10-20 DIAGNOSIS — Z87891 Personal history of nicotine dependence: Secondary | ICD-10-CM | POA: Diagnosis not present

## 2020-10-20 DIAGNOSIS — M5136 Other intervertebral disc degeneration, lumbar region: Secondary | ICD-10-CM | POA: Diagnosis not present

## 2020-10-20 DIAGNOSIS — F429 Obsessive-compulsive disorder, unspecified: Secondary | ICD-10-CM | POA: Diagnosis not present

## 2020-10-20 DIAGNOSIS — N3289 Other specified disorders of bladder: Secondary | ICD-10-CM | POA: Diagnosis not present

## 2020-10-20 DIAGNOSIS — I2510 Atherosclerotic heart disease of native coronary artery without angina pectoris: Secondary | ICD-10-CM | POA: Diagnosis not present

## 2020-10-20 DIAGNOSIS — K449 Diaphragmatic hernia without obstruction or gangrene: Secondary | ICD-10-CM | POA: Diagnosis not present

## 2020-10-20 DIAGNOSIS — G9389 Other specified disorders of brain: Secondary | ICD-10-CM | POA: Diagnosis not present

## 2020-10-20 DIAGNOSIS — M4804 Spinal stenosis, thoracic region: Secondary | ICD-10-CM | POA: Diagnosis not present

## 2020-10-20 DIAGNOSIS — E210 Primary hyperparathyroidism: Secondary | ICD-10-CM | POA: Diagnosis not present

## 2020-10-20 DIAGNOSIS — Z87440 Personal history of urinary (tract) infections: Secondary | ICD-10-CM | POA: Diagnosis not present

## 2020-10-20 DIAGNOSIS — F319 Bipolar disorder, unspecified: Secondary | ICD-10-CM | POA: Diagnosis not present

## 2020-10-20 LAB — URINE CULTURE
MICRO NUMBER:: 11451441
SPECIMEN QUALITY:: ADEQUATE

## 2020-10-21 ENCOUNTER — Encounter: Payer: Self-pay | Admitting: Internal Medicine

## 2020-10-21 ENCOUNTER — Ambulatory Visit (INDEPENDENT_AMBULATORY_CARE_PROVIDER_SITE_OTHER): Payer: Medicare Other | Admitting: Internal Medicine

## 2020-10-21 VITALS — BP 118/58 | HR 68 | Ht 68.0 in | Wt 169.1 lb

## 2020-10-21 DIAGNOSIS — E162 Hypoglycemia, unspecified: Secondary | ICD-10-CM

## 2020-10-21 DIAGNOSIS — R825 Elevated urine levels of drugs, medicaments and biological substances: Secondary | ICD-10-CM

## 2020-10-21 DIAGNOSIS — I129 Hypertensive chronic kidney disease with stage 1 through stage 4 chronic kidney disease, or unspecified chronic kidney disease: Secondary | ICD-10-CM | POA: Diagnosis not present

## 2020-10-21 DIAGNOSIS — I959 Hypotension, unspecified: Secondary | ICD-10-CM | POA: Diagnosis not present

## 2020-10-21 DIAGNOSIS — N401 Enlarged prostate with lower urinary tract symptoms: Secondary | ICD-10-CM | POA: Diagnosis not present

## 2020-10-21 DIAGNOSIS — N183 Chronic kidney disease, stage 3 unspecified: Secondary | ICD-10-CM | POA: Diagnosis not present

## 2020-10-21 DIAGNOSIS — F0150 Vascular dementia without behavioral disturbance: Secondary | ICD-10-CM | POA: Diagnosis not present

## 2020-10-21 DIAGNOSIS — F319 Bipolar disorder, unspecified: Secondary | ICD-10-CM | POA: Diagnosis not present

## 2020-10-21 MED ORDER — DIAZOXIDE 50 MG/ML PO SUSP: 100.0000 mg | Freq: Two times a day (BID) | ORAL | 11 refills | Status: DC

## 2020-10-21 NOTE — Patient Instructions (Signed)
-   Diazoxide 100 mg TWICE daily

## 2020-10-21 NOTE — Progress Notes (Signed)
Name: Andres Stevens  MRN/ DOB: [id number], 25-Jun-1946    Age/ Sex: 75 y.o., male     PCP: Hali Marry, MD   Reason for Endocrinology Evaluation: Elevated Catecholamine     Initial Endocrinology Clinic Visit: 08/30/2020    PATIENT IDENTIFIER: Andres Stevens is a 75 y.o., male with a past medical history of Dyslipidemia, bipolar disorder and history of hemorrhagic stroke, S/P parathyroidectomy and vascular dementia . He has followed with [facility] since 08/30/2020 for consultative assistance with management of his elevated catecholamine.      HISTORICAL SUMMARY:  During evaluation for elevated BP readings at home in 07/2120 he was noted to have elevated plasma norepinephrine at 1973 pg/mL, and normetanephrine at 513 pg/ml but with normal epinephrine and metanephrines. His abdominal CT on 08/09/2020 was unrevealing.   He has been on doxazosin  since hemorrhagic stroke ~ 2016 . IN the past few weeks wife noted labile BP's. With SBP as high as 200 mg/dL mainly during the evening and lower in the morning. His Doxazosin dose has been increased to 2 mg and BP has been more manageable .     HYPOGLYCEMIA HISTORY: Pt was admitted to [facility] on 08/31/2020 with a BG of 33 mg/dL during evaluation for altered mental status as well as hypotension. He was treated with IV dextrose through EMS BY his errival to the ED his Bg was 57 mg/dL.   He was treated medically for suspected insulinoma with diazoxide 09/04/2020 . CT pancreatic protocol negative for pancreatic mass . Of note the pt was also diagnosed with Enterococcus UTI and treated with Vancomycin   NO biological children  SUBJECTIVE:    Today ([id number]):  Andres Stevens is here for a follow up on elevated catecholamine.  He is accompanied by his wife today    He denies dizziness, or excessive sweating  Denies palpitations Denies change in bowel movements.   ENDOCRINE MEDICATIONS Doxazosin 1 mg   QHS  Diazoxide 125 mg BID - has been taking 30 mg BID      HISTORY:  Past Medical History:  Past Medical History:  Diagnosis Date  . Bipolar 1 disorder ([place])   . Wears hearing aid    Past Surgical History:  Past Surgical History:  Procedure Laterality Date  . CERVICAL FUSION    . PARATHYROIDECTOMY  03/2016    Social History:  reports that he quit smoking about 5 years ago. His smoking use included cigarettes. He smoked 0.25 packs per day. He has never used smokeless tobacco. He reports current alcohol use. He reports that he does not use drugs. Family History:  Family History  Problem Relation Age of Onset  . Depression Father      HOME MEDICATIONS: Allergies as of 10/21/2020      Reactions   Bee Venom Anaphylaxis   Hx of local rxn      Medication List       Accurate as of October 21, 2020 11:30 AM. If you have any questions, ask your nurse or doctor.        acetaminophen 650 MG CR tablet Commonly known as: TYLENOL Take 2 tablets (1,300 mg total) by mouth every 8 (eight) hours as needed for pain.   ALLERGY MED PO Take by mouth.   allopurinol 300 MG tablet Commonly known as: ZYLOPRIM Take 1 tablet (300 mg total) by mouth daily.   atorvastatin 40 MG tablet Commonly known as: LIPITOR Take 1 tablet by mouth once daily  B Complex-C Caps Take 1 capsule by mouth daily.   CETIRIZINE HCL PO Take 1 tablet by mouth daily.   Cholecalciferol 125 MCG (5000 UT) capsule Take 10,000 Units by mouth daily.   diazoxide 50 MG/ML suspension Commonly known as: PROGLYCEM Take 2 mLs (100 mg total) by mouth every 12 (twelve) hours. What changed:   how much to take  when to take this Changed by: Dorita Sciara, MD   divalproex 250 MG DR tablet Commonly known as: DEPAKOTE Take 1 tablet by mouth in the morning, at noon, and at bedtime.   doxazosin 1 MG tablet Commonly known as: CARDURA Take 2 tablets (2 mg total) by mouth 2 (two) times daily.   lamoTRIgine  100 MG tablet Commonly known as: LAMICTAL Take 100 mg by mouth at bedtime.   OLANZapine 5 MG tablet Commonly known as: ZYPREXA Take 1 tablet by mouth at bedtime.   OLANZapine 2.5 MG tablet Commonly known as: ZYPREXA Take 1 tablet by mouth as needed. Take with 5 mg UNLESS SEDATED   pantoprazole 40 MG tablet Commonly known as: PROTONIX Take 40 mg by mouth daily.   tamsulosin 0.4 MG Caps capsule Commonly known as: FLOMAX Take by mouth.         OBJECTIVE:   PHYSICAL EXAM: VS: BP (!) 118/58   Pulse 68   Ht 5\' 8"  (1.727 m)   Wt 169 lb 2 oz (76.7 kg)   SpO2 98%   BMI 25.72 kg/m    EXAM: General: Pt appears well and is in NAD  Neck: General: Supple without adenopathy. Thyroid: Thyroid size normal.  No goiter or nodules appreciated.   Lungs: Clear with good BS bilat with no rales, rhonchi, or wheezes  Heart: Auscultation: RRR.  Abdomen: Normoactive bowel sounds, soft, nontender, without masses or organomegaly palpable  Extremities:  BL LE: No pretibial edema normal ROM and strength.  Mental Status:  Mood and affect: No depression, anxiety, or agitation     DATA REVIEWED: 09/01/2020   3:58 AM  Insulin 2.7 uU/mL  Proinsulin 8.7 uU/mL    No concomitante serum glucose   But has a finger stick of 100 at 2:30 Am      CT abdomen /Pelvis 09/09/2020  CT ABDOMEN: Normal pancreas. No evidence for pancreatic tumor. Lung bases demonstrate a small right pleural effusion with dependent right lower lobe subsegmental atelectasis. Thick-walled esophagus suggesting esophagitis. Normal heart size. Normal liver, gallbladder, biliary tree, pancreas, spleen, and adrenal glands. Small kidneys with bilateral renal cortical cysts. Aortoiliac atherosclerosis without aneurysm. No ascites or adenopathy.   CT PELVIS: Fat-containing bilateral inguinal hernias. Normal bladder, prostate, and seminal vesicles. Nonspecific gaseous distention of stomach, small bowel, and colon. No pelvic mass,  adenopathy, or fluid collection. Multilevel lumbar DDD.    IMPRESSION: No evidence for pancreatic tumor.      Old records , labs and images have been reviewed.   ASSESSMENT / PLAN / RECOMMENDATIONS:   1. Elevated Catecholamine   - IN hind sight , this could have been elevated due to hypoglycemia.  - We initially were going to proceed with imaging for paraganglioma but we have opted to repeat catecholamine testing and proceed from there.  - If levels continue to be 2x upper limit of normal, will proceed with DOTATE PET scan      2. Hypoglycemia :   - High clinical suspicion for insulinoma , unfortunately no biochemical tests to confirm it during hospitalizations. Negative CT imaging does NOT exclude insulinoma  as they tend to be small and require endoscopic ultrasound  - But  Given co-morbidity it has been determined that treating hypoglycemia symptomatically is more beneficial at this time  - Will adjust Diazoxide dose as below , he has been receiving suboptimal dose  - Wife to test in the morning at first and when symptomatic during the day but once he is stable on current dose , may check as needed and based o nsyptoms     Medication Diazoxide 100 mg BID     F/U in 3 months  Signed electronically by: Mack Guise, MD  [facility] Endocrinology  [facility] [address] [place], [place] [address] Phone: [phone] FAX: [phone]      CC: Hali Marry, [address] [place] [address] Phone: [phone]  Fax: [phone]   Return to Endocrinology clinic as below: Future Appointments  Date Time Provider [place]  01/21/2021 11:15 AM Trula Slade, DPM [id number] None

## 2020-10-22 ENCOUNTER — Other Ambulatory Visit (INDEPENDENT_AMBULATORY_CARE_PROVIDER_SITE_OTHER): Payer: Medicare Other

## 2020-10-22 ENCOUNTER — Other Ambulatory Visit: Payer: Self-pay

## 2020-10-22 DIAGNOSIS — R825 Elevated urine levels of drugs, medicaments and biological substances: Secondary | ICD-10-CM | POA: Diagnosis not present

## 2020-10-24 NOTE — Progress Notes (Signed)
Subjective:   Patient ID: Tyrone Sage, male   DOB: 75 y.o.   MRN: [id number]   HPI 75 year old male with past medical history significant for vascular dementia, presents the office today for concerns of his nails becoming thick, discolored and he is unable to trim them himself.  They do get tender at times particular with pressure.  Denies any redness or drainage or any swelling.  He has no other concerns today.   Review of Systems  All other systems reviewed and are negative.  Past Medical History:  Diagnosis Date  . Bipolar 1 disorder ([place])   . Wears hearing aid     Past Surgical History:  Procedure Laterality Date  . CERVICAL FUSION    . PARATHYROIDECTOMY  03/2016     Current Outpatient Medications:  .  acetaminophen (TYLENOL) 650 MG CR tablet, Take 2 tablets (1,300 mg total) by mouth every 8 (eight) hours as needed for pain., Disp: 90 tablet, Rfl: 3 .  allopurinol (ZYLOPRIM) 300 MG tablet, Take 1 tablet (300 mg total) by mouth daily., Disp: 90 tablet, Rfl: 1 .  atorvastatin (LIPITOR) 40 MG tablet, Take 1 tablet by mouth once daily, Disp: 90 tablet, Rfl: 3 .  B Complex-C CAPS, Take 1 capsule by mouth daily., Disp: , Rfl:  .  CETIRIZINE HCL PO, Take 1 tablet by mouth daily., Disp: , Rfl:  .  Cholecalciferol 125 MCG (5000 UT) capsule, Take 10,000 Units by mouth daily., Disp: , Rfl:  .  diazoxide (PROGLYCEM) 50 MG/ML suspension, Take 2 mLs (100 mg total) by mouth every 12 (twelve) hours., Disp: 60 mL, Rfl: 11 .  DiphenhydrAMINE HCl (ALLERGY MED PO), Take by mouth., Disp: , Rfl:  .  divalproex (DEPAKOTE) 250 MG DR tablet, Take 1 tablet by mouth in the morning, at noon, and at bedtime., Disp: , Rfl:  .  doxazosin (CARDURA) 1 MG tablet, Take 2 tablets (2 mg total) by mouth 2 (two) times daily., Disp: 180 tablet, Rfl: 1 .  lamoTRIgine (LAMICTAL) 100 MG tablet, Take 100 mg by mouth at bedtime., Disp: , Rfl:  .  OLANZapine (ZYPREXA) 2.5 MG tablet, Take 1 tablet by mouth as needed.  Take with 5 mg UNLESS SEDATED, Disp: , Rfl:  .  OLANZapine (ZYPREXA) 5 MG tablet, Take 1 tablet by mouth at bedtime., Disp: , Rfl:  .  pantoprazole (PROTONIX) 40 MG tablet, Take 40 mg by mouth daily., Disp: , Rfl:  .  tamsulosin (FLOMAX) 0.4 MG CAPS capsule, Take by mouth., Disp: , Rfl:   Allergies  Allergen Reactions  . Bee Venom Anaphylaxis    Hx of local rxn          Objective:  Physical Exam  General: NAD- with family member   Dermatological: Nails are hypertrophic, dystrophic, brittle, yellow to brown discoloration, elongated 10.  Also some dried blood present in the toenails.  There is no hyperpigmentation otherwise.  No surrounding redness or drainage. Tenderness nails 1-5 bilaterally. No open lesions or pre-ulcerative lesions are identified today.   Vascular: Dorsalis Pedis artery and Posterior Tibial artery pedal pulses are 2/4 bilateral with immedate capillary fill time. There is no pain with calf compression, swelling, warmth, erythema.   Neruologic: Grossly intact via light touch bilateral.   Musculoskeletal: No other areas of discomfort identified today.  Muscular strength 5/5 in all groups tested bilateral.     Assessment:   75 year old male with symptomatic onychomycosis     Plan:  -Treatment options discussed including all  alternatives, risks, and complications -Etiology of symptoms were discussed -Debrided the nails x10 without any complications or bleeding.  We discussed treatment options for nail fungus however we decided to hold off on this and recommended routine debridements. -Daily foot inspection.  Trula Slade DPM

## 2020-10-25 DIAGNOSIS — N401 Enlarged prostate with lower urinary tract symptoms: Secondary | ICD-10-CM | POA: Diagnosis not present

## 2020-10-25 DIAGNOSIS — N183 Chronic kidney disease, stage 3 unspecified: Secondary | ICD-10-CM | POA: Diagnosis not present

## 2020-10-25 DIAGNOSIS — I959 Hypotension, unspecified: Secondary | ICD-10-CM | POA: Diagnosis not present

## 2020-10-25 DIAGNOSIS — F319 Bipolar disorder, unspecified: Secondary | ICD-10-CM | POA: Diagnosis not present

## 2020-10-25 DIAGNOSIS — I129 Hypertensive chronic kidney disease with stage 1 through stage 4 chronic kidney disease, or unspecified chronic kidney disease: Secondary | ICD-10-CM | POA: Diagnosis not present

## 2020-10-25 DIAGNOSIS — F0150 Vascular dementia without behavioral disturbance: Secondary | ICD-10-CM | POA: Diagnosis not present

## 2020-10-26 ENCOUNTER — Ambulatory Visit: Payer: Medicare Other | Admitting: Family Medicine

## 2020-10-27 LAB — METANEPHRINES, PLASMA
Metanephrine, Free: <25 pg/mL (ref <=57)
Normetanephrine, Free: 270 pg/mL — ABNORMAL HIGH (ref <=148)
Total Metanephrines-Plasma: 270 pg/mL — ABNORMAL HIGH (ref <=205)

## 2020-10-28 DIAGNOSIS — N183 Chronic kidney disease, stage 3 unspecified: Secondary | ICD-10-CM | POA: Diagnosis not present

## 2020-10-28 DIAGNOSIS — N401 Enlarged prostate with lower urinary tract symptoms: Secondary | ICD-10-CM | POA: Diagnosis not present

## 2020-10-28 DIAGNOSIS — I129 Hypertensive chronic kidney disease with stage 1 through stage 4 chronic kidney disease, or unspecified chronic kidney disease: Secondary | ICD-10-CM | POA: Diagnosis not present

## 2020-10-28 DIAGNOSIS — I959 Hypotension, unspecified: Secondary | ICD-10-CM | POA: Diagnosis not present

## 2020-10-28 DIAGNOSIS — F0150 Vascular dementia without behavioral disturbance: Secondary | ICD-10-CM | POA: Diagnosis not present

## 2020-10-28 DIAGNOSIS — F319 Bipolar disorder, unspecified: Secondary | ICD-10-CM | POA: Diagnosis not present

## 2020-10-29 DIAGNOSIS — N183 Chronic kidney disease, stage 3 unspecified: Secondary | ICD-10-CM | POA: Diagnosis not present

## 2020-10-29 DIAGNOSIS — N401 Enlarged prostate with lower urinary tract symptoms: Secondary | ICD-10-CM | POA: Diagnosis not present

## 2020-10-29 DIAGNOSIS — I129 Hypertensive chronic kidney disease with stage 1 through stage 4 chronic kidney disease, or unspecified chronic kidney disease: Secondary | ICD-10-CM | POA: Diagnosis not present

## 2020-10-29 DIAGNOSIS — F319 Bipolar disorder, unspecified: Secondary | ICD-10-CM | POA: Diagnosis not present

## 2020-10-29 DIAGNOSIS — F0150 Vascular dementia without behavioral disturbance: Secondary | ICD-10-CM | POA: Diagnosis not present

## 2020-10-29 DIAGNOSIS — I959 Hypotension, unspecified: Secondary | ICD-10-CM | POA: Diagnosis not present

## 2020-11-01 DIAGNOSIS — I129 Hypertensive chronic kidney disease with stage 1 through stage 4 chronic kidney disease, or unspecified chronic kidney disease: Secondary | ICD-10-CM | POA: Diagnosis not present

## 2020-11-01 DIAGNOSIS — N183 Chronic kidney disease, stage 3 unspecified: Secondary | ICD-10-CM | POA: Diagnosis not present

## 2020-11-01 DIAGNOSIS — F0150 Vascular dementia without behavioral disturbance: Secondary | ICD-10-CM | POA: Diagnosis not present

## 2020-11-01 DIAGNOSIS — N401 Enlarged prostate with lower urinary tract symptoms: Secondary | ICD-10-CM | POA: Diagnosis not present

## 2020-11-01 DIAGNOSIS — F319 Bipolar disorder, unspecified: Secondary | ICD-10-CM | POA: Diagnosis not present

## 2020-11-01 DIAGNOSIS — I959 Hypotension, unspecified: Secondary | ICD-10-CM | POA: Diagnosis not present

## 2020-11-03 DIAGNOSIS — F319 Bipolar disorder, unspecified: Secondary | ICD-10-CM | POA: Diagnosis not present

## 2020-11-03 DIAGNOSIS — N183 Chronic kidney disease, stage 3 unspecified: Secondary | ICD-10-CM | POA: Diagnosis not present

## 2020-11-03 DIAGNOSIS — F0150 Vascular dementia without behavioral disturbance: Secondary | ICD-10-CM | POA: Diagnosis not present

## 2020-11-03 DIAGNOSIS — N401 Enlarged prostate with lower urinary tract symptoms: Secondary | ICD-10-CM | POA: Diagnosis not present

## 2020-11-03 DIAGNOSIS — I129 Hypertensive chronic kidney disease with stage 1 through stage 4 chronic kidney disease, or unspecified chronic kidney disease: Secondary | ICD-10-CM | POA: Diagnosis not present

## 2020-11-03 DIAGNOSIS — I959 Hypotension, unspecified: Secondary | ICD-10-CM | POA: Diagnosis not present

## 2020-11-03 LAB — CATECHOLAMINES, FRACTIONATED, PLASMA
Dopamine: <10 pg/mL
Epinephrine: <20 pg/mL
Norepinephrine: 1734 pg/mL — ABNORMAL HIGH
Total Catecholamines: 1734 pg/mL — ABNORMAL HIGH

## 2020-11-04 ENCOUNTER — Other Ambulatory Visit: Payer: Self-pay

## 2020-11-04 MED ORDER — OLANZAPINE 5 MG PO TABS: 5.0000 mg | ORAL_TABLET | Freq: Every day | ORAL | 1 refills | Status: DC

## 2020-11-04 MED ORDER — DIVALPROEX SODIUM 250 MG PO DR TAB: 250.0000 mg | DELAYED_RELEASE_TABLET | Freq: Three times a day (TID) | ORAL | 1 refills | Status: DC

## 2020-11-04 NOTE — Telephone Encounter (Signed)
Andres Stevens's wife called and states he needs refills on Zyprexa 5 mg at bedtime and Depakote 250 mg tid. He has an appointment in the next month with Neurology.

## 2020-11-05 DIAGNOSIS — F0390 Unspecified dementia without behavioral disturbance: Secondary | ICD-10-CM | POA: Diagnosis not present

## 2020-11-09 DIAGNOSIS — F319 Bipolar disorder, unspecified: Secondary | ICD-10-CM | POA: Diagnosis not present

## 2020-11-09 DIAGNOSIS — N183 Chronic kidney disease, stage 3 unspecified: Secondary | ICD-10-CM | POA: Diagnosis not present

## 2020-11-09 DIAGNOSIS — I959 Hypotension, unspecified: Secondary | ICD-10-CM | POA: Diagnosis not present

## 2020-11-09 DIAGNOSIS — F0150 Vascular dementia without behavioral disturbance: Secondary | ICD-10-CM | POA: Diagnosis not present

## 2020-11-09 DIAGNOSIS — I129 Hypertensive chronic kidney disease with stage 1 through stage 4 chronic kidney disease, or unspecified chronic kidney disease: Secondary | ICD-10-CM | POA: Diagnosis not present

## 2020-11-09 DIAGNOSIS — N401 Enlarged prostate with lower urinary tract symptoms: Secondary | ICD-10-CM | POA: Diagnosis not present

## 2020-11-09 DIAGNOSIS — Z719 Counseling, unspecified: Secondary | ICD-10-CM | POA: Diagnosis not present

## 2020-11-09 DIAGNOSIS — F0390 Unspecified dementia without behavioral disturbance: Secondary | ICD-10-CM | POA: Diagnosis not present

## 2020-11-10 DIAGNOSIS — F0150 Vascular dementia without behavioral disturbance: Secondary | ICD-10-CM | POA: Diagnosis not present

## 2020-11-10 DIAGNOSIS — I129 Hypertensive chronic kidney disease with stage 1 through stage 4 chronic kidney disease, or unspecified chronic kidney disease: Secondary | ICD-10-CM | POA: Diagnosis not present

## 2020-11-10 DIAGNOSIS — F319 Bipolar disorder, unspecified: Secondary | ICD-10-CM | POA: Diagnosis not present

## 2020-11-10 DIAGNOSIS — N401 Enlarged prostate with lower urinary tract symptoms: Secondary | ICD-10-CM | POA: Diagnosis not present

## 2020-11-10 DIAGNOSIS — N183 Chronic kidney disease, stage 3 unspecified: Secondary | ICD-10-CM | POA: Diagnosis not present

## 2020-11-10 DIAGNOSIS — I959 Hypotension, unspecified: Secondary | ICD-10-CM | POA: Diagnosis not present

## 2020-11-11 DIAGNOSIS — N401 Enlarged prostate with lower urinary tract symptoms: Secondary | ICD-10-CM | POA: Diagnosis not present

## 2020-11-11 DIAGNOSIS — I129 Hypertensive chronic kidney disease with stage 1 through stage 4 chronic kidney disease, or unspecified chronic kidney disease: Secondary | ICD-10-CM | POA: Diagnosis not present

## 2020-11-11 DIAGNOSIS — F319 Bipolar disorder, unspecified: Secondary | ICD-10-CM | POA: Diagnosis not present

## 2020-11-11 DIAGNOSIS — I959 Hypotension, unspecified: Secondary | ICD-10-CM | POA: Diagnosis not present

## 2020-11-11 DIAGNOSIS — F0150 Vascular dementia without behavioral disturbance: Secondary | ICD-10-CM | POA: Diagnosis not present

## 2020-11-11 DIAGNOSIS — N183 Chronic kidney disease, stage 3 unspecified: Secondary | ICD-10-CM | POA: Diagnosis not present

## 2020-11-13 ENCOUNTER — Encounter: Payer: Self-pay | Admitting: Internal Medicine

## 2020-11-14 IMAGING — DX DG CERVICAL SPINE COMPLETE 4+V
6 series · 6 of 6 positions shown · non-contrast
Comparison: 10/10/2012

CLINICAL DATA: Neck pain with decreased range of motion, initial
encounter

EXAM:
CERVICAL SPINE - COMPLETE 4+ VIEW

[c-spine lat]
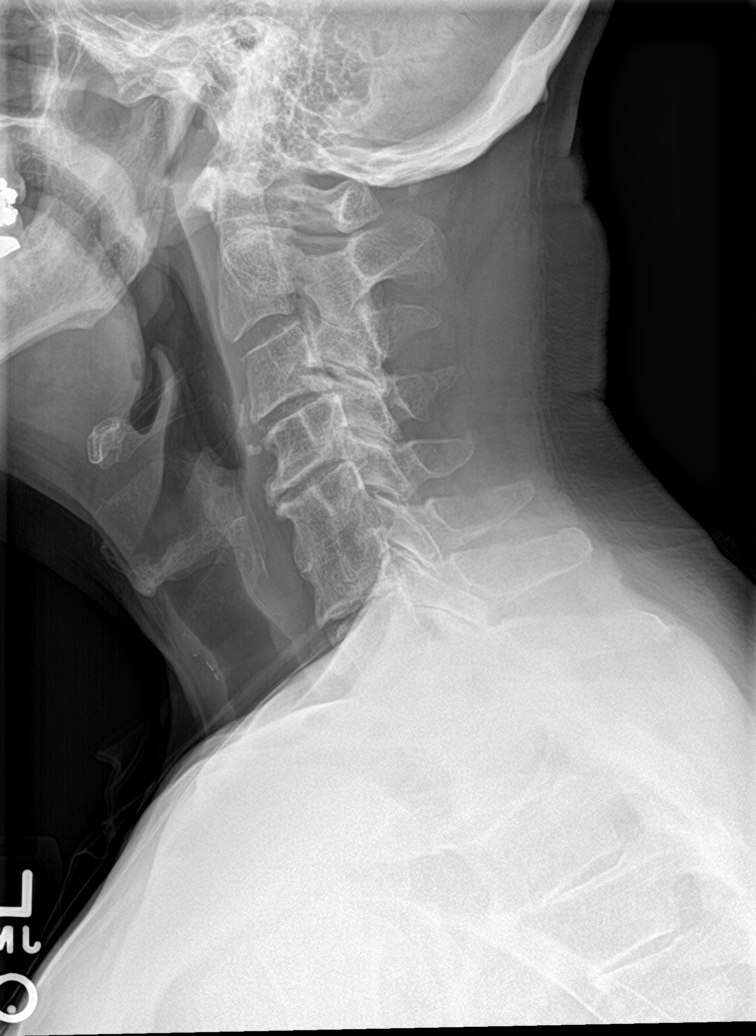

[c-spine obl (1 of 2)]
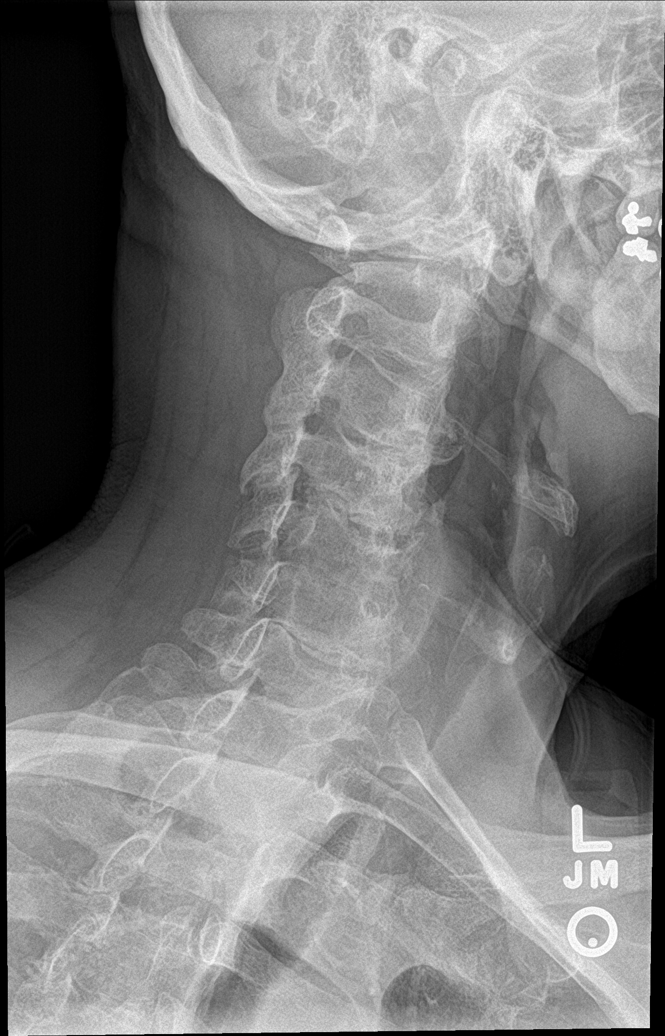

[c-spine obl (2 of 2)]
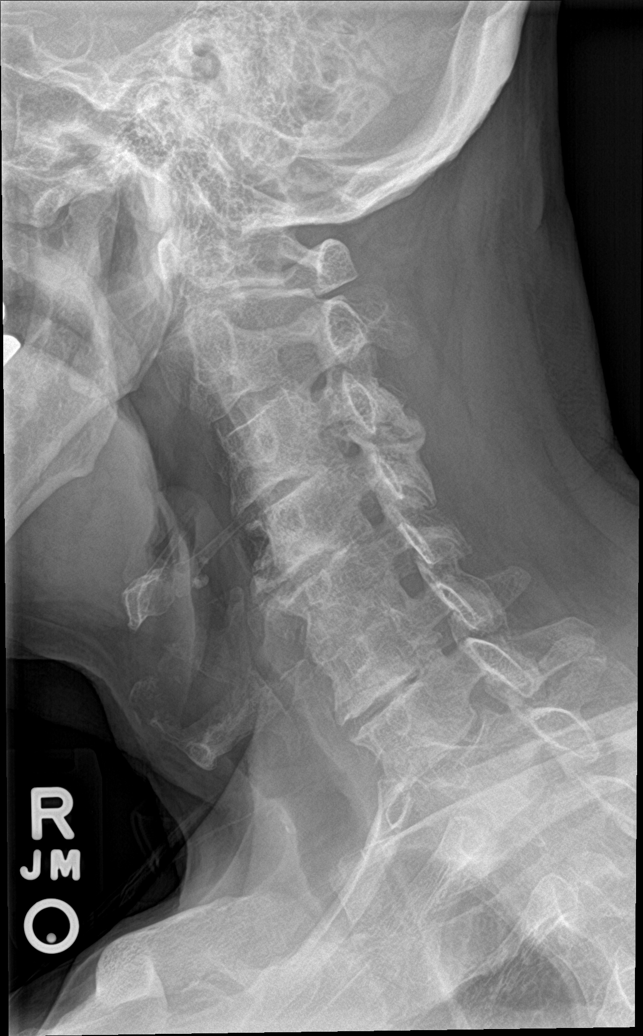

[c-spine ap]
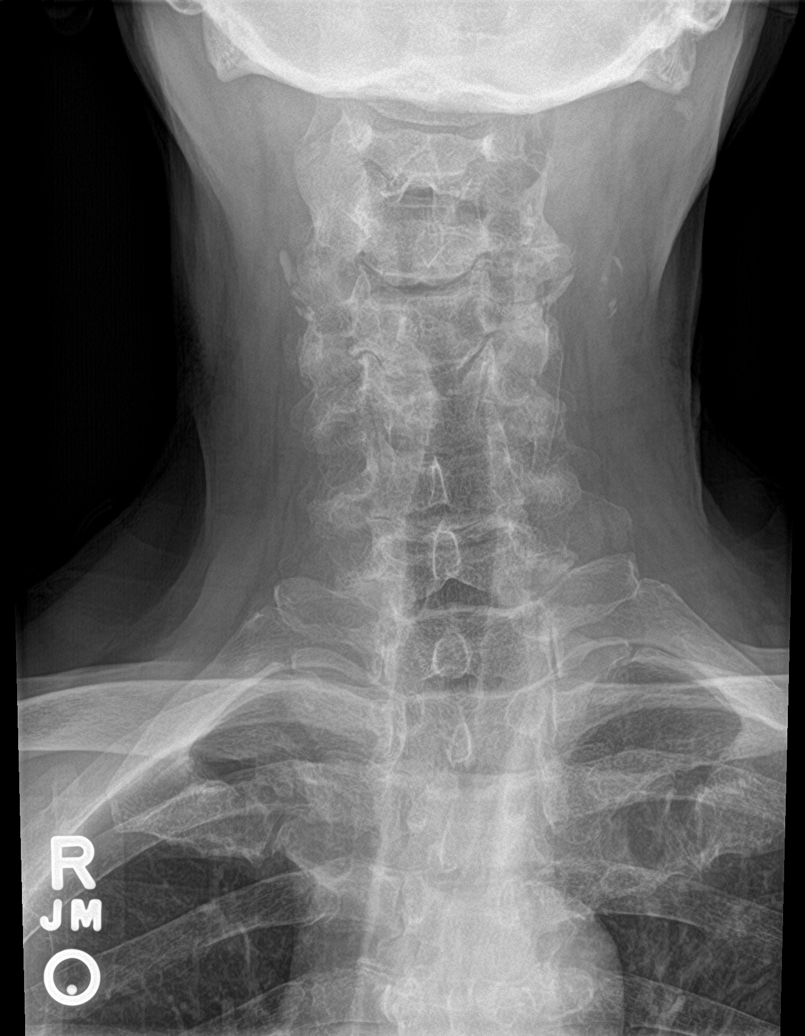

[c-spine open mouth]
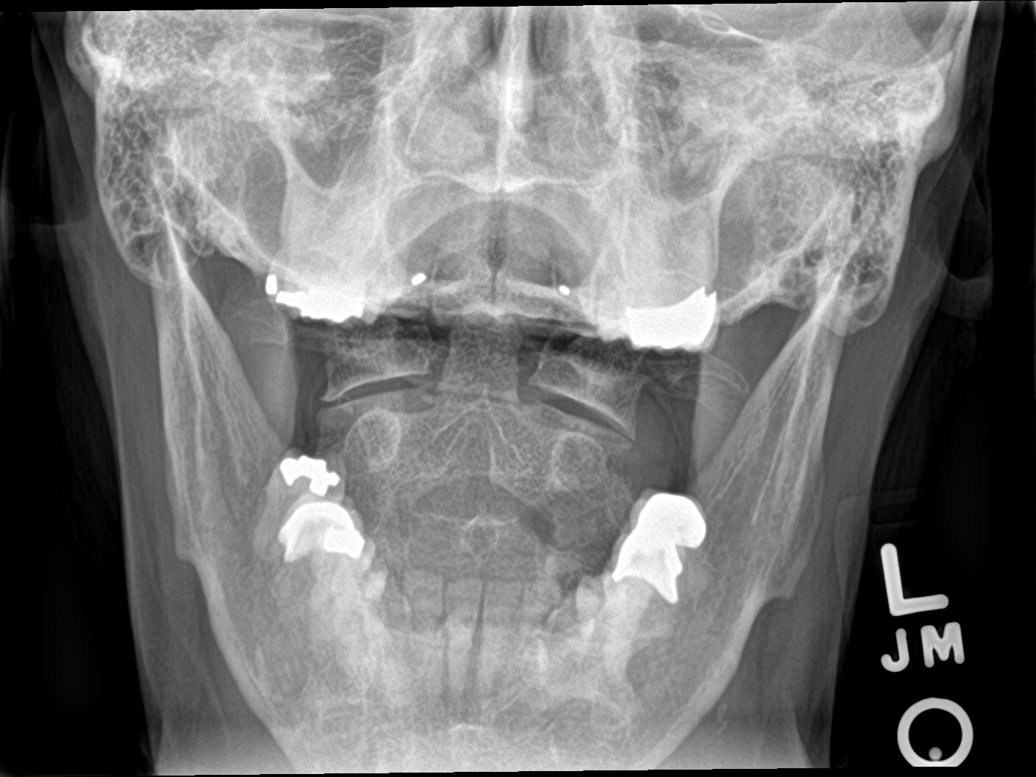

[c-spine swimmers]
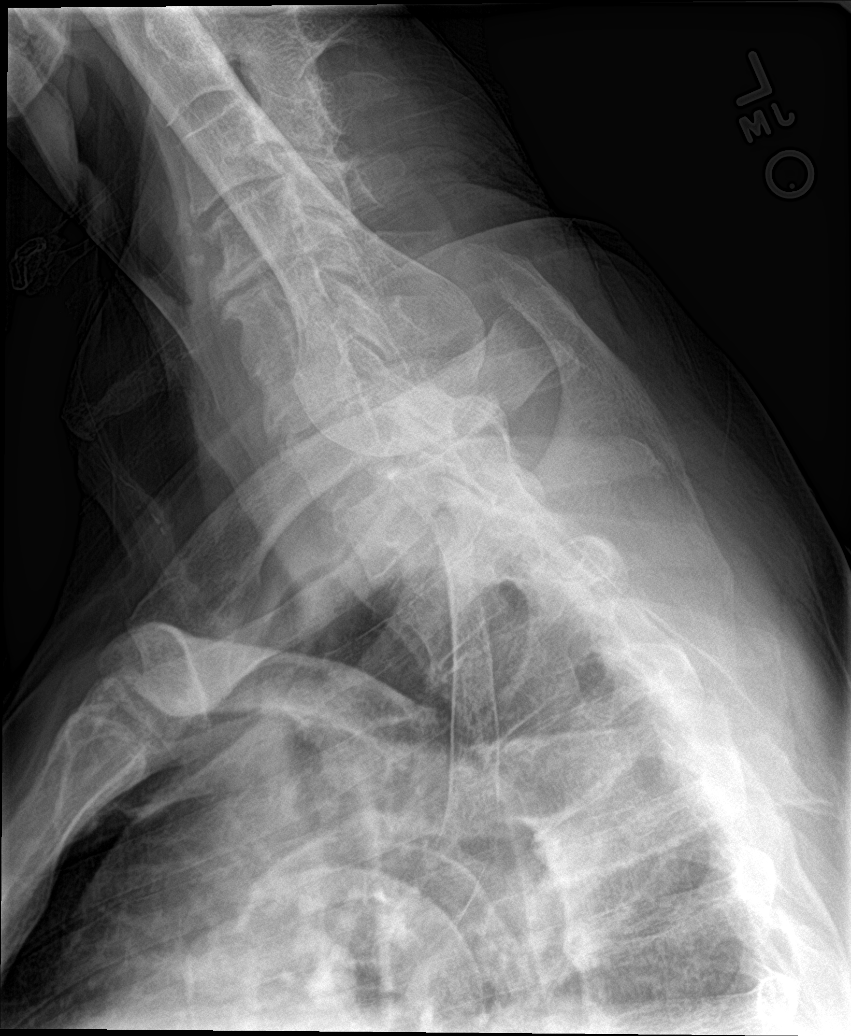

[6 of 6 positions shown; findings below may reference images not displayed]

FINDINGS: Seven cervical segments are well visualized. Changes of prior fusion
of an acquired nature are noted at C5-6. Multilevel osteophytic
changes and facet hypertrophic changes are noted which contribute to
bilateral neural foraminal narrowing slightly greater on the right
than the left. No prevertebral soft tissue changes are seen. Carotid
calcifications are noted. The odontoid is within normal limits.
IMPRESSION: Multilevel degenerative change.  No acute abnormality noted.

## 2020-11-14 IMAGING — DX DG LUMBAR SPINE COMPLETE 4+V
5 series · 5 of 5 positions shown · non-contrast
Comparison: None.

CLINICAL DATA: Back pain

EXAM:
LUMBAR SPINE - COMPLETE 4+ VIEW

[l-spine ap]
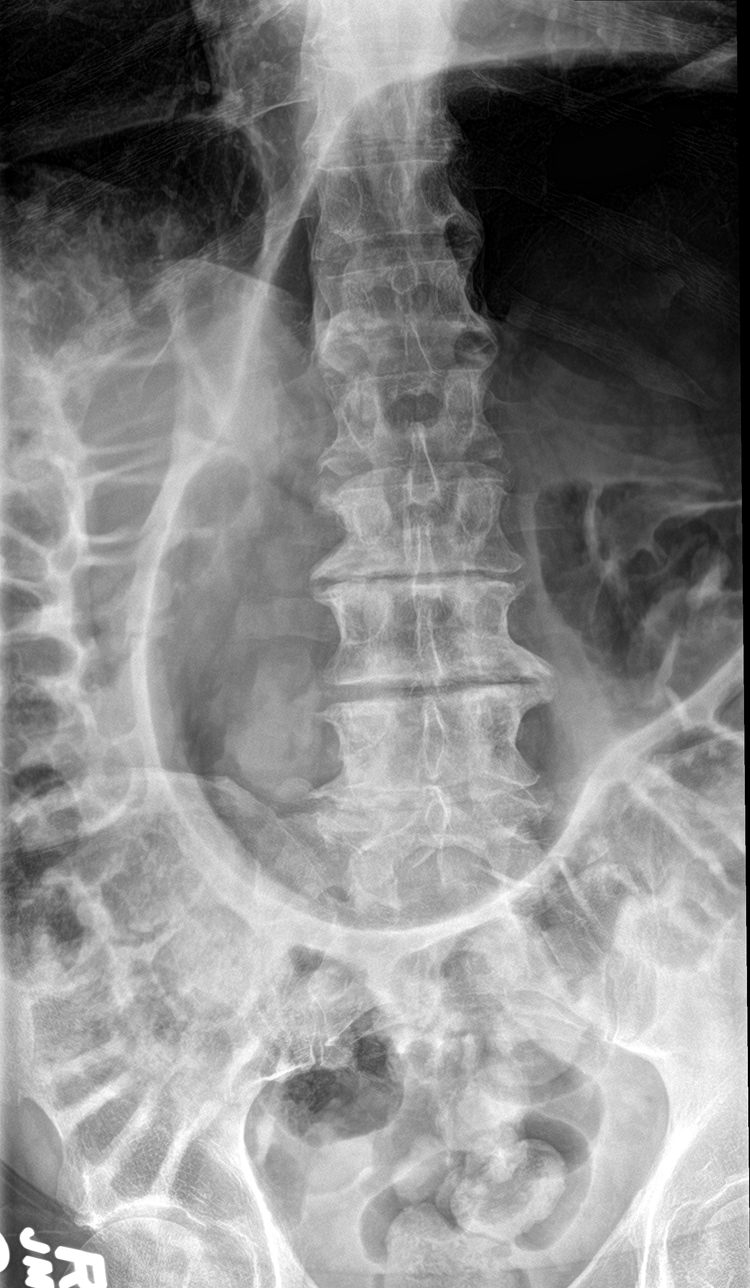

[l-spine obl (1 of 2)]
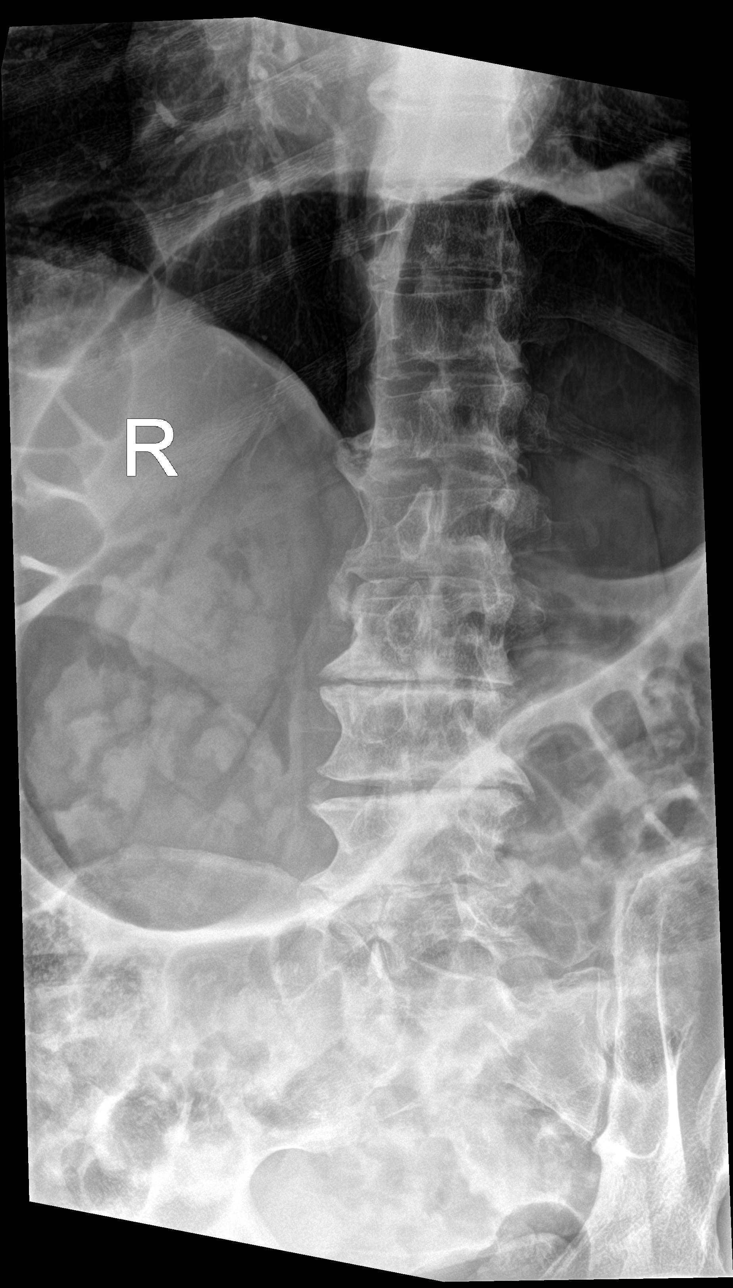

[l-spine obl (2 of 2)]
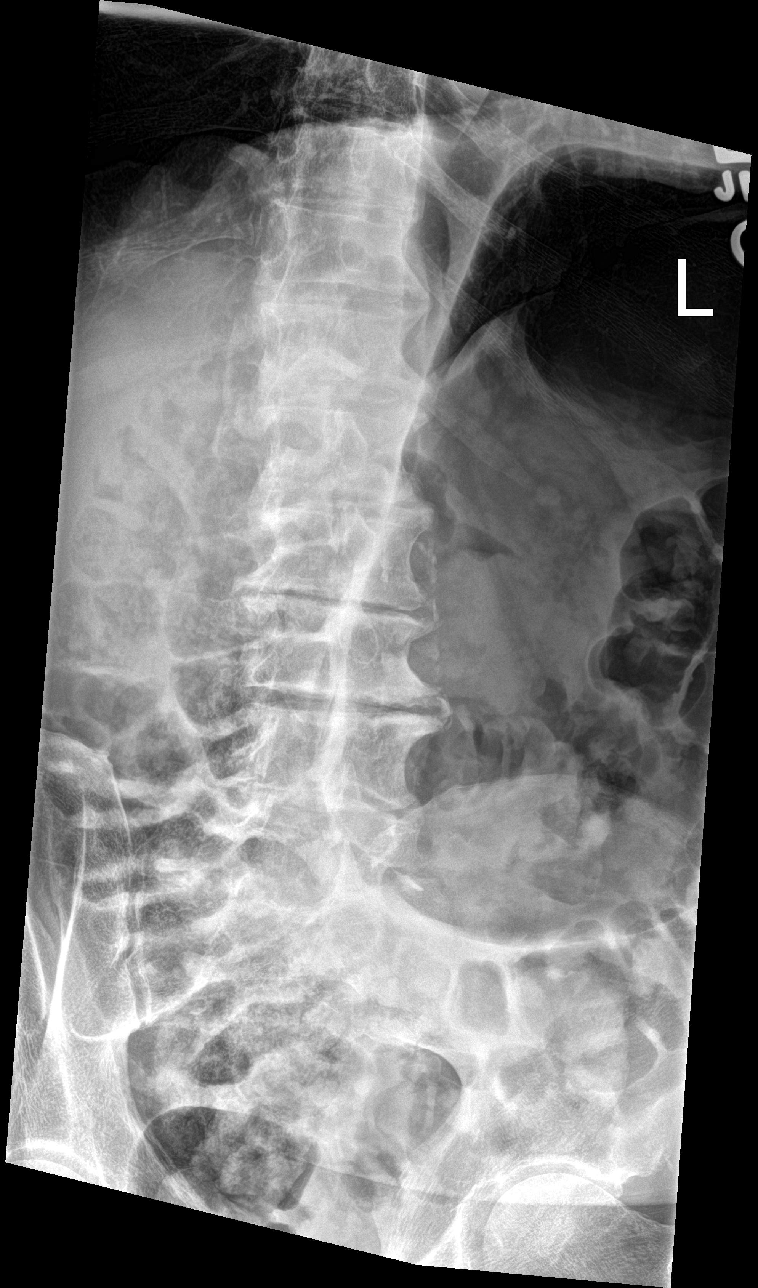

[l-spine lat]
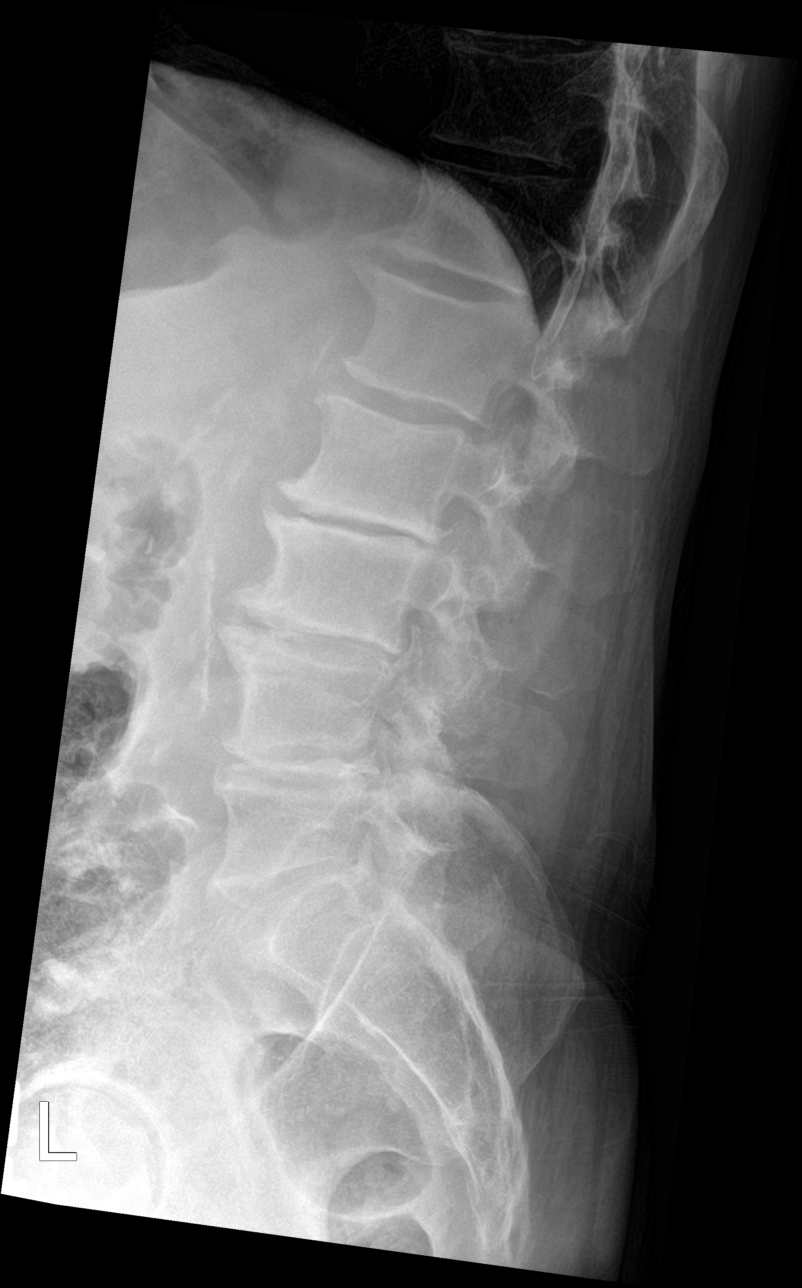

[l-spine spot]
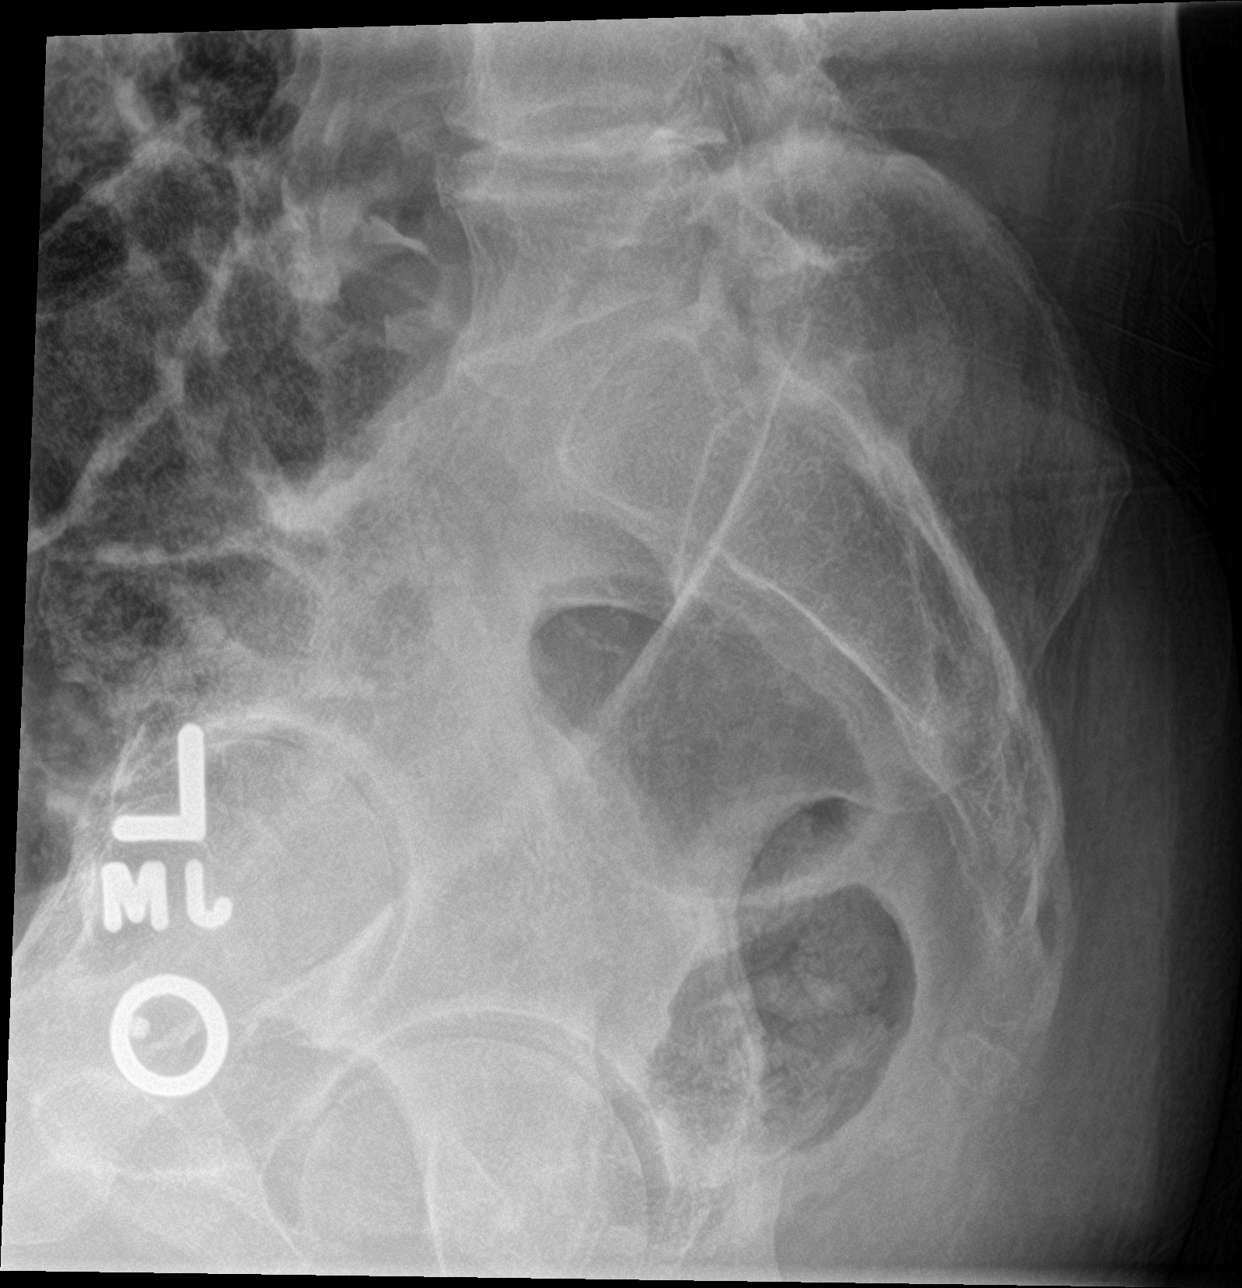

[5 of 5 positions shown; findings below may reference images not displayed]

FINDINGS: Five lumbar type vertebral bodies are well visualized. Vertebral
body height is well maintained. No pars defects are noted. Facet
hypertrophic changes and osteophytic changes are noted. Multilevel
disc space narrowing is seen. No anterolisthesis is noted.
Significant gaseous distension of the stomach is noted which may
contribute to the patient's discomfort. No other focal abnormality
is seen.
IMPRESSION: Multilevel degenerative change as described.

Significant gaseous distension of the stomach.

## 2020-11-15 DIAGNOSIS — F0150 Vascular dementia without behavioral disturbance: Secondary | ICD-10-CM | POA: Diagnosis not present

## 2020-11-15 DIAGNOSIS — Z8659 Personal history of other mental and behavioral disorders: Secondary | ICD-10-CM | POA: Diagnosis not present

## 2020-11-15 DIAGNOSIS — G251 Drug-induced tremor: Secondary | ICD-10-CM | POA: Diagnosis not present

## 2020-11-15 NOTE — Telephone Encounter (Signed)
PLease let the wife know that the scan we were going to do is NOT for high insulin. The scan was going to be for paraganglioma. The results may be skewed if his sugars are low ( when I saw him he was on the right dose ) once his sugars have been stable . We can reassess.

## 2020-11-23 DIAGNOSIS — F3175 Bipolar disorder, in partial remission, most recent episode depressed: Secondary | ICD-10-CM | POA: Diagnosis not present

## 2020-12-14 ENCOUNTER — Encounter: Payer: Self-pay | Admitting: Family Medicine

## 2020-12-16 ENCOUNTER — Other Ambulatory Visit: Payer: Self-pay | Admitting: Family Medicine

## 2020-12-16 DIAGNOSIS — M79674 Pain in right toe(s): Secondary | ICD-10-CM

## 2020-12-21 DIAGNOSIS — F3175 Bipolar disorder, in partial remission, most recent episode depressed: Secondary | ICD-10-CM | POA: Diagnosis not present

## 2021-01-05 ENCOUNTER — Telehealth (INDEPENDENT_AMBULATORY_CARE_PROVIDER_SITE_OTHER): Payer: Medicare Other | Admitting: Sports Medicine

## 2021-01-05 ENCOUNTER — Other Ambulatory Visit: Payer: Self-pay

## 2021-01-05 ENCOUNTER — Encounter: Payer: Self-pay | Admitting: Sports Medicine

## 2021-01-05 ENCOUNTER — Ambulatory Visit (INDEPENDENT_AMBULATORY_CARE_PROVIDER_SITE_OTHER): Payer: Medicare Other

## 2021-01-05 DIAGNOSIS — R509 Fever, unspecified: Secondary | ICD-10-CM

## 2021-01-05 DIAGNOSIS — R059 Cough, unspecified: Secondary | ICD-10-CM

## 2021-01-05 DIAGNOSIS — R053 Chronic cough: Secondary | ICD-10-CM | POA: Insufficient documentation

## 2021-01-05 MED ORDER — AZITHROMYCIN 250 MG PO TABS: ORAL_TABLET | ORAL | 0 refills | Status: DC

## 2021-01-05 NOTE — Progress Notes (Signed)
   Virtual Visit via Telephone   I connected with  Ival Wedge's wife Jontez Redfield on [birth year] by telephone/telehealth and verified that I am speaking about the using two identifiers.  Latavius has dementia so communication needs to be with his wife.   I discussed the limitations, risks, security and privacy concerns of performing an evaluation and management service by telephone, including the higher likelihood of inaccurate diagnosis and treatment, and the availability of in person appointments.  We also discussed the likely need of an additional face to face encounter for complete and high quality delivery of care.  I also discussed with the patient that there may be a patient responsible charge related to this service. The patient/HCPOA expressed understanding and wishes to proceed.  Provider location is in medical facility. Patient location is at their home, different from provider location. People involved in care of the patient during this telehealth encounter were myself, my nurse/medical assistant, and my front office/scheduling team member.  Review of Systems: No fevers, chills, night sweats, weight loss, chest pain, or shortness of breath.   Objective Findings:    General: Speaking full sentences, no audible heavy breathing.  Sounds alert and appropriately interactive.    Independent interpretation of tests performed by another provider:   None.  Brief History, Exam, Impression, and Recommendations:    Coughing Lucy is a 75 year old male, I am speaking to his wife as he does have dementia, from most 2 weeks now he has had fevers, coughing, malaise, sore throat. Mild constipation. Per his wife's report he does not have any nasal flaring, accessory muscle use, and is able to speak full sentences. He did have a couple of negative Covid test recently. Considering this was a telephone visit, I cannot lay eyes on him to determine how sick he is, his history of chronic  renal insufficiency, dementia, we will be aggressive with treatment, adding 5 days of azithromycin, and they will come right now for a chest x-ray. They need to follow-up with either myself or Dr. Madilyn Fireman in a week.   I discussed the above assessment and treatment plan with the patient. The patient was provided an opportunity to ask questions and all were answered. The patient agreed with the plan and demonstrated an understanding of the instructions.   The patient was advised to call back or seek an in-person evaluation if the symptoms worsen or if the condition fails to improve as anticipated.   I provided 30 minutes of face to face and non-face-to-face time during this encounter date, time was needed to gather information, review chart, records, communicate/coordinate with staff remotely, as well as complete documentation.   ___________________________________________ Gwen Her. Dianah Field, M.D., ABFM., CAQSM. Primary Care and Sports Medicine [facility]  Adjunct Professor of [employer]

## 2021-01-05 NOTE — Progress Notes (Signed)
Patient has dementia. Patient felt warm to the touch, temporal temp was 104. Gave him Tylenol and fever came down to 100 and most recently 98.7. Wife is pushing fluids to combat dehydration. She reports cold symptoms for 1+ weeks using Alka Seltzer Plus . Home COVID test was negative.

## 2021-01-05 NOTE — Assessment & Plan Note (Signed)
Andres Stevens is a 75 year old male, I am speaking to his wife as he does have dementia, from most 2 weeks now he has had fevers, coughing, malaise, sore throat. Mild constipation. Per his wife's report he does not have any nasal flaring, accessory muscle use, and is able to speak full sentences. He did have a couple of negative Covid test recently. Considering this was a telephone visit, I cannot lay eyes on him to determine how sick he is, his history of chronic renal insufficiency, dementia, we will be aggressive with treatment, adding 5 days of azithromycin, and they will come right now for a chest x-ray. They need to follow-up with either myself or Dr. Madilyn Fireman in a week.

## 2021-01-06 ENCOUNTER — Other Ambulatory Visit: Payer: Self-pay | Admitting: Family Medicine

## 2021-01-10 NOTE — Telephone Encounter (Signed)
Seen by patient Andres Stevens on 06/14/1659 10:54 PM

## 2021-01-12 ENCOUNTER — Ambulatory Visit: Payer: Medicare Other | Admitting: Sports Medicine

## 2021-01-13 ENCOUNTER — Other Ambulatory Visit: Payer: Self-pay

## 2021-01-13 ENCOUNTER — Ambulatory Visit (INDEPENDENT_AMBULATORY_CARE_PROVIDER_SITE_OTHER): Payer: Medicare Other | Admitting: Family Medicine

## 2021-01-13 ENCOUNTER — Encounter: Payer: Self-pay | Admitting: Family Medicine

## 2021-01-13 VITALS — BP 124/63 | HR 72 | Ht 68.0 in | Wt 166.0 lb

## 2021-01-13 DIAGNOSIS — F0150 Vascular dementia without behavioral disturbance: Secondary | ICD-10-CM

## 2021-01-13 DIAGNOSIS — F3170 Bipolar disorder, currently in remission, most recent episode unspecified: Secondary | ICD-10-CM | POA: Diagnosis not present

## 2021-01-13 DIAGNOSIS — M1A09X0 Idiopathic chronic gout, multiple sites, without tophus (tophi): Secondary | ICD-10-CM

## 2021-01-13 DIAGNOSIS — Z8673 Personal history of transient ischemic attack (TIA), and cerebral infarction without residual deficits: Secondary | ICD-10-CM

## 2021-01-13 DIAGNOSIS — E538 Deficiency of other specified B group vitamins: Secondary | ICD-10-CM | POA: Diagnosis not present

## 2021-01-13 DIAGNOSIS — N1831 Chronic kidney disease, stage 3a: Secondary | ICD-10-CM | POA: Diagnosis not present

## 2021-01-13 DIAGNOSIS — N261 Atrophy of kidney (terminal): Secondary | ICD-10-CM | POA: Diagnosis not present

## 2021-01-13 DIAGNOSIS — Z973 Presence of spectacles and contact lenses: Secondary | ICD-10-CM | POA: Diagnosis not present

## 2021-01-13 DIAGNOSIS — E291 Testicular hypofunction: Secondary | ICD-10-CM

## 2021-01-13 DIAGNOSIS — N401 Enlarged prostate with lower urinary tract symptoms: Secondary | ICD-10-CM

## 2021-01-13 DIAGNOSIS — M5136 Other intervertebral disc degeneration, lumbar region: Secondary | ICD-10-CM | POA: Diagnosis not present

## 2021-01-13 DIAGNOSIS — Z77090 Contact with and (suspected) exposure to asbestos: Secondary | ICD-10-CM

## 2021-01-13 DIAGNOSIS — R351 Nocturia: Secondary | ICD-10-CM

## 2021-01-13 NOTE — Progress Notes (Signed)
Established Patient Office Visit  Subjective:  Patient ID: Andres Stevens, male    DOB: 1945-10-12  Age: 75 y.o. MRN: [id number]  CC:  Chief Complaint  Patient presents with  . Follow-up  . Form Completion    [place]    HPI Andres Stevens presents with his wife today to discuss going into a nursing home.  His wife was initially just interested in doing some respite care but finally found a place that she feels really comfortable with where he will be well taken care of and she can visit frequently.  She did bring in an FL 2 form with her today.  He is currently being maintained at home and she is his sole caregiver and really does not have any additional help or assistance.  He has vascular dementia which has been gradually getting worse in fact his wife says its been continuing to decline over the last couple of weeks.  He just needs more assistance he is needing help with more activities.  Right now he currently needs help with bathing showering dressing.  He is able to eat without difficulty but is unable to prepare his own foods though occasionally he will make himself a bowl of cereal.  He does wear glasses.  He is intermittently confused.  His wife is just at the point where she feels like she cannot continue to take care of him 24/7.  He did want to let me know that he is now off of his Depakote they weaned him off completely.  He is also off the Protonix.  Past Medical History:  Diagnosis Date  . Bipolar 1 disorder ([place])   . Wears hearing aid     Past Surgical History:  Procedure Laterality Date  . CERVICAL FUSION    . PARATHYROIDECTOMY  03/2016    Family History  Problem Relation Age of Onset  . Depression Father     Social History   Socioeconomic History  . Marital status: Married    Spouse name: Not on file  . Number of children: Not on file  . Years of education: Not on file  . Highest education level: Not on file  Occupational History  . Not  on file  Tobacco Use  . Smoking status: Former Smoker    Packs/day: 0.25    Types: Cigarettes    Quit date: 06/26/2015    Years since quitting: 5.5  . Smokeless tobacco: Never Used  Substance and Sexual Activity  . Alcohol use: Yes    Alcohol/week: 0.0 standard drinks  . Drug use: No  . Sexual activity: Not on file  Other Topics Concern  . Not on file  Social History Narrative  . Not on file   Social Determinants of Health   Financial Resource Strain: Not on file  Food Insecurity: Not on file  Transportation Needs: Not on file  Physical Activity: Not on file  Stress: Not on file  Social Connections: Not on file  Intimate Partner Violence: Not on file    Outpatient Medications Prior to Visit  Medication Sig Dispense Refill  . acetaminophen (TYLENOL) 650 MG CR tablet Take 2 tablets (1,300 mg total) by mouth every 8 (eight) hours as needed for pain. 90 tablet 3  . allopurinol (ZYLOPRIM) 300 MG tablet Take 1 tablet by mouth once daily 90 tablet 0  . atorvastatin (LIPITOR) 40 MG tablet Take 1 tablet by mouth once daily 90 tablet 3  . B Complex-C CAPS Take 1  capsule by mouth daily.    Marland Kitchen CETIRIZINE HCL PO Take 1 tablet by mouth daily.    . Cholecalciferol 125 MCG (5000 UT) capsule Take 10,000 Units by mouth daily.    . diazoxide (PROGLYCEM) 50 MG/ML suspension Take 2 mLs (100 mg total) by mouth every 12 (twelve) hours. 60 mL 11  . doxazosin (CARDURA) 1 MG tablet Take 1 mg by mouth daily.    Marland Kitchen lamoTRIgine (LAMICTAL) 100 MG tablet Take 100 mg by mouth at bedtime.    Marland Kitchen OLANZapine (ZYPREXA) 5 MG tablet TAKE 1 TABLET BY MOUTH AT BEDTIME 30 tablet 0  . azithromycin (ZITHROMAX Z-PAK) 250 MG tablet Take 2 tablets (500 mg) on  Day 1,  followed by 1 tablet (250 mg) once daily on Days 2 through 5. 6 tablet 0  . DiphenhydrAMINE HCl (ALLERGY MED PO) Take by mouth.    . divalproex (DEPAKOTE) 250 MG DR tablet Take 1 tablet (250 mg total) by mouth in the morning, at noon, and at bedtime. 90 tablet  1  . doxazosin (CARDURA) 1 MG tablet Take 2 tablets (2 mg total) by mouth 2 (two) times daily. 180 tablet 1  . pantoprazole (PROTONIX) 40 MG tablet Take 40 mg by mouth daily.    . tamsulosin (FLOMAX) 0.4 MG CAPS capsule Take by mouth.     No facility-administered medications prior to visit.    Allergies  Allergen Reactions  . Bee Venom Anaphylaxis    Hx of local rxn     ROS Review of Systems    Objective:    Physical Exam Constitutional:      Appearance: He is well-developed.  HENT:     Head: Normocephalic and atraumatic.  Cardiovascular:     Rate and Rhythm: Normal rate and regular rhythm.     Heart sounds: Normal heart sounds.  Pulmonary:     Effort: Pulmonary effort is normal.     Breath sounds: Normal breath sounds.  Skin:    General: Skin is warm and dry.  Neurological:     Mental Status: He is alert and oriented to person, place, and time.  Psychiatric:        Behavior: Behavior normal.     BP 124/63   Pulse 72   Ht 5\' 8"  (1.727 m)   Wt 166 lb (75.3 kg)   SpO2 100%   BMI 25.24 kg/m  Wt Readings from Last 3 Encounters:  01/13/21 166 lb (75.3 kg)  10/21/20 169 lb 2 oz (76.7 kg)  10/18/20 166 lb (75.3 kg)     Health Maintenance Due  Topic Date Due  . COVID-19 Vaccine (4 - Booster for Pfizer series) 12/21/2020    There are no preventive care reminders to display for this patient.  Lab Results  Component Value Date   TSH 4.39 08/26/2020   Lab Results  Component Value Date   WBC 5.5 08/26/2020   HGB 12.2 (L) 08/26/2020   HCT 35.0 (L) 08/26/2020   MCV 90.4 08/26/2020   PLT 141 08/26/2020   Lab Results  Component Value Date   NA 140 10/18/2020   K 4.7 10/18/2020   CO2 30 10/18/2020   GLUCOSE 92 10/18/2020   BUN 28 (H) 10/18/2020   CREATININE 1.13 10/18/2020   BILITOT 0.4 10/18/2020   ALKPHOS 59 10/23/2016   AST 17 10/18/2020   ALT 11 10/18/2020   PROT 6.5 10/18/2020   ALBUMIN 4.2 10/23/2016   CALCIUM 9.3 10/18/2020   Lab Results  Component Value Date   CHOL 110 08/19/2019   Lab Results  Component Value Date   HDL 53 08/19/2019   Lab Results  Component Value Date   LDLCALC 23 08/19/2019   Lab Results  Component Value Date   TRIG 289 (H) 08/19/2019   Lab Results  Component Value Date   CHOLHDL 2.1 08/19/2019   Lab Results  Component Value Date   HGBA1C 4.6 02/17/2020      Assessment & Plan:   Problem List Items Addressed This Visit      Endocrine   Hypogonadism male    Currently not treated.  Asymptomatic        Nervous and Auditory   Vascular dementia without behavioral disturbance ([place]) - Primary    Felt to be secondary to vascular disease.  Prior history of stroke.  Unfortunately continues to decline and now needing more intensive around-the-clock care.        Musculoskeletal and Integument   Degenerative disc disease, lumbar    He does have pain intermittently but usually controls it with Tylenol.        Genitourinary   Renal atrophy   CKD (chronic kidney disease) stage 3, GFR 30-59 ml/min (HCC)    Evidence of renal atrophy.  Renal function followed every 6 months.  He is due in July.        Other   Wears glasses   History of stroke   History of exposure to asbestos   Gout    Well-controlled with daily dose of allopurinol.  Continue current regimen.      BPH associated with nocturia    Continue doxazosin 1 mg daily.      Bipolar disorder ([place])    Followed by psychiatry.  No longer on Depakote.  Currently on Lamictal and Zyprexa      B12 deficiency    Currently takes an over-the-counter B12 complex.         FL 2 form completed as well.  He will come by on Monday to have a TB skin test placed and we will give him the form and paperwork including up-to-date med list at that time.  I spent 45 minutes on the day of the encounter to include pre-visit record review, face-to-face time with the patient and post visit ordering of test. .   No orders of the defined  types were placed in this encounter.   Follow-up: Return in about 4 months (around 05/15/2021) for kidney check up.  Beatrice Lecher, MD

## 2021-01-14 ENCOUNTER — Encounter: Payer: Self-pay | Admitting: Family Medicine

## 2021-01-17 ENCOUNTER — Other Ambulatory Visit: Payer: Self-pay

## 2021-01-17 ENCOUNTER — Ambulatory Visit (INDEPENDENT_AMBULATORY_CARE_PROVIDER_SITE_OTHER): Payer: Medicare Other | Admitting: Family Medicine

## 2021-01-17 VITALS — BP 140/67 | HR 75 | Temp 97.9°F | Resp 20 | Ht 68.0 in | Wt 162.0 lb

## 2021-01-17 DIAGNOSIS — Z111 Encounter for screening for respiratory tuberculosis: Secondary | ICD-10-CM | POA: Diagnosis not present

## 2021-01-17 NOTE — Assessment & Plan Note (Signed)
Felt to be secondary to vascular disease.  Prior history of stroke.  Unfortunately continues to decline and now needing more intensive around-the-clock care.

## 2021-01-17 NOTE — Assessment & Plan Note (Signed)
Evidence of renal atrophy.  Renal function followed every 6 months.  He is due in July.

## 2021-01-17 NOTE — Assessment & Plan Note (Signed)
Currently not treated.  Asymptomatic

## 2021-01-17 NOTE — Assessment & Plan Note (Signed)
Well-controlled with daily dose of allopurinol.  Continue current regimen.

## 2021-01-17 NOTE — Progress Notes (Signed)
Agree with documentation as above.   Jamye Balicki, MD  

## 2021-01-17 NOTE — Assessment & Plan Note (Signed)
Currently takes an over-the-counter B12 complex.

## 2021-01-17 NOTE — Progress Notes (Signed)
Established Patient Office Visit  Subjective:  Patient ID: Andres Stevens, male    DOB: [birth year]  Age: 75 y.o. MRN: [id number]  CC:  Chief Complaint  Patient presents with  . PPD Placement    HPI Andres Stevens presents for PPD placement. Placed in right forearm. Pt tolerated well with no apparent complications. Pt has appt in 2 days for PPD test reading.  Past Medical History:  Diagnosis Date  . Bipolar 1 disorder ([place])   . Wears hearing aid     Past Surgical History:  Procedure Laterality Date  . CERVICAL FUSION    . PARATHYROIDECTOMY  03/2016    Family History  Problem Relation Age of Onset  . Depression Father     Social History   Socioeconomic History  . Marital status: Married    Spouse name: Not on file  . Number of children: Not on file  . Years of education: Not on file  . Highest education level: Not on file  Occupational History  . Not on file  Tobacco Use  . Smoking status: Former Smoker    Packs/day: 0.25    Types: Cigarettes    Quit date: 06/26/2015    Years since quitting: 5.5  . Smokeless tobacco: Never Used  Substance and Sexual Activity  . Alcohol use: Yes    Alcohol/week: 0.0 standard drinks  . Drug use: No  . Sexual activity: Not on file  Other Topics Concern  . Not on file  Social History Narrative  . Not on file   Social Determinants of Health   Financial Resource Strain: Not on file  Food Insecurity: Not on file  Transportation Needs: Not on file  Physical Activity: Not on file  Stress: Not on file  Social Connections: Not on file  Intimate Partner Violence: Not on file    Outpatient Medications Prior to Visit  Medication Sig Dispense Refill  . acetaminophen (TYLENOL) 650 MG CR tablet Take 2 tablets (1,300 mg total) by mouth every 8 (eight) hours as needed for pain. 90 tablet 3  . allopurinol (ZYLOPRIM) 300 MG tablet Take 1 tablet by mouth once daily 90 tablet 0  . atorvastatin (LIPITOR) 40 MG tablet Take 1 tablet  by mouth once daily 90 tablet 3  . B Complex-C CAPS Take 1 capsule by mouth daily.    Marland Kitchen CETIRIZINE HCL PO Take 1 tablet by mouth daily.    . Cholecalciferol 125 MCG (5000 UT) capsule Take 10,000 Units by mouth daily.    . diazoxide (PROGLYCEM) 50 MG/ML suspension Take 2 mLs (100 mg total) by mouth every 12 (twelve) hours. 60 mL 11  . doxazosin (CARDURA) 1 MG tablet Take 1 mg by mouth daily.    Marland Kitchen lamoTRIgine (LAMICTAL) 100 MG tablet Take 100 mg by mouth at bedtime.    Marland Kitchen OLANZapine (ZYPREXA) 5 MG tablet TAKE 1 TABLET BY MOUTH AT BEDTIME 30 tablet 0   No facility-administered medications prior to visit.    Allergies  Allergen Reactions  . Bee Venom Anaphylaxis    Hx of local rxn     ROS Review of Systems    Objective:    Physical Exam  BP 140/67 (BP Location: Left Arm, Patient Position: Sitting, Cuff Size: Normal)   Pulse 75   Temp 97.9 F (36.6 C) (Oral)   Resp 20   Ht 5\' 8"  (1.727 m)   Wt 162 lb (73.5 kg)   SpO2 99%   BMI 24.63 kg/m  Wt Readings  from Last 3 Encounters:  01/17/21 162 lb (73.5 kg)  01/13/21 166 lb (75.3 kg)  10/21/20 169 lb 2 oz (76.7 kg)     Health Maintenance Due  Topic Date Due  . COVID-19 Vaccine (4 - Booster for Pfizer series) 12/21/2020    There are no preventive care reminders to display for this patient.  Lab Results  Component Value Date   TSH 4.39 08/26/2020   Lab Results  Component Value Date   WBC 5.5 08/26/2020   HGB 12.2 (L) 08/26/2020   HCT 35.0 (L) 08/26/2020   MCV 90.4 08/26/2020   PLT 141 08/26/2020   Lab Results  Component Value Date   NA 140 10/18/2020   K 4.7 10/18/2020   CO2 30 10/18/2020   GLUCOSE 92 10/18/2020   BUN 28 (H) 10/18/2020   CREATININE 1.13 10/18/2020   BILITOT 0.4 10/18/2020   ALKPHOS 59 10/23/2016   AST 17 10/18/2020   ALT 11 10/18/2020   PROT 6.5 10/18/2020   ALBUMIN 4.2 10/23/2016   CALCIUM 9.3 10/18/2020   Lab Results  Component Value Date   CHOL 110 08/19/2019   Lab Results   Component Value Date   HDL 53 08/19/2019   Lab Results  Component Value Date   LDLCALC 23 08/19/2019   Lab Results  Component Value Date   TRIG 289 (H) 08/19/2019   Lab Results  Component Value Date   CHOLHDL 2.1 08/19/2019   Lab Results  Component Value Date   HGBA1C 4.6 02/17/2020      Assessment & Plan:  Placed in right forearm. Pt tolerated well with no apparent complications. Pt has appt in 2 days for PPD test reading. Problem List Items Addressed This Visit   None   Visit Diagnoses    PPD screening test    -  Primary   Relevant Orders   TB Skin Test (Completed)      No orders of the defined types were placed in this encounter.   Follow-up: Return in about 2 days (around 01/19/2021) for TB test reading.    Ninfa Meeker, CMA

## 2021-01-17 NOTE — Assessment & Plan Note (Signed)
Followed by psychiatry.  No longer on Depakote.  Currently on Lamictal and Zyprexa

## 2021-01-17 NOTE — Assessment & Plan Note (Signed)
He does have pain intermittently but usually controls it with Tylenol.

## 2021-01-17 NOTE — Assessment & Plan Note (Signed)
Continue doxazosin 1 mg daily.

## 2021-01-19 ENCOUNTER — Other Ambulatory Visit: Payer: Self-pay

## 2021-01-19 ENCOUNTER — Telehealth: Payer: Self-pay | Admitting: Internal Medicine

## 2021-01-19 ENCOUNTER — Ambulatory Visit (INDEPENDENT_AMBULATORY_CARE_PROVIDER_SITE_OTHER): Payer: Medicare Other | Admitting: Internal Medicine

## 2021-01-19 ENCOUNTER — Encounter: Payer: Self-pay | Admitting: Internal Medicine

## 2021-01-19 ENCOUNTER — Ambulatory Visit (INDEPENDENT_AMBULATORY_CARE_PROVIDER_SITE_OTHER): Payer: Medicare Other | Admitting: Family Medicine

## 2021-01-19 VITALS — BP 117/92 | HR 80 | Ht 68.0 in | Wt 164.0 lb

## 2021-01-19 VITALS — BP 120/76 | HR 77 | Ht 68.0 in | Wt 163.2 lb

## 2021-01-19 DIAGNOSIS — R825 Elevated urine levels of drugs, medicaments and biological substances: Secondary | ICD-10-CM

## 2021-01-19 DIAGNOSIS — Z111 Encounter for screening for respiratory tuberculosis: Secondary | ICD-10-CM

## 2021-01-19 DIAGNOSIS — E162 Hypoglycemia, unspecified: Secondary | ICD-10-CM | POA: Diagnosis not present

## 2021-01-19 LAB — COMPREHENSIVE METABOLIC PANEL
ALT: 13 U/L (ref 0–53)
AST: 16 U/L (ref 0–37)
Albumin: 4.4 g/dL (ref 3.5–5.2)
Alkaline Phosphatase: 78 U/L (ref 39–117)
BUN: 27 mg/dL — ABNORMAL HIGH (ref 6–23)
CO2: 27 mEq/L (ref 19–32)
Calcium: 9.7 mg/dL (ref 8.4–10.5)
Chloride: 102 mEq/L (ref 96–112)
Creatinine, Ser: 1.2 mg/dL (ref 0.40–1.50)
GFR: 58.58 mL/min — ABNORMAL LOW (ref >60.00)
Glucose, Bld: 93 mg/dL (ref 70–99)
Potassium: 4.6 mEq/L (ref 3.5–5.1)
Sodium: 138 mEq/L (ref 135–145)
Total Bilirubin: 0.4 mg/dL (ref 0.2–1.2)
Total Protein: 7.1 g/dL (ref 6.0–8.3)

## 2021-01-19 LAB — TB SKIN TEST
Induration: 0 mm
TB Skin Test: NEGATIVE

## 2021-01-19 LAB — POCT GLUCOSE (DEVICE FOR HOME USE): POC Glucose: 110 mg/dl — AB (ref 70–99)

## 2021-01-19 NOTE — Progress Notes (Signed)
Agree with documentation as above.   Symphony Demuro, MD  

## 2021-01-19 NOTE — Telephone Encounter (Signed)
Found the paperwork to be able to do patient assistance.  I have called patient's wife and inform her that I was able to provide the application to see if we can get help for the diaozxide. She decline on the application stating they make too much money and believes they will be denied so she don't want to try it. Inform her that we will never until we try.  I have inform her that if she changes her mind, a copy of the application have been left in the front office just in case.

## 2021-01-19 NOTE — Telephone Encounter (Signed)
Andres Stevens,   Can you please see if we can apply for patient assistance program for the diazoxide ( Proglycem) its teir 5 for them. ?   Thanks

## 2021-01-19 NOTE — Progress Notes (Signed)
Negative PPD test.  Letter of results and signed DNR form given to patient.

## 2021-01-19 NOTE — Progress Notes (Signed)
Name: Andres Stevens  MRN/ DOB: [id number], [birth year]    Age/ Sex: 75 y.o., male     PCP: Hali Marry, MD   Reason for Endocrinology Evaluation: Elevated Catecholamine     Initial Endocrinology Clinic Visit: 08/30/2020    PATIENT IDENTIFIER: Andres Stevens is a 75 y.o., male with a past medical history of Dyslipidemia, bipolar disorder and history of hemorrhagic stroke, S/P parathyroidectomy and vascular dementia . He has followed with [facility] since 08/30/2020 for consultative assistance with management of his elevated catecholamine.      HISTORICAL SUMMARY:  During evaluation for elevated BP readings at home in 07/2120 he was noted to have elevated plasma norepinephrine at 1973 pg/mL, and normetanephrine at 513 pg/ml but with normal epinephrine and metanephrines. His abdominal CT on 08/09/2020 was unrevealing.   He has been on doxazosin  since hemorrhagic stroke ~ 2016 . IN the past few weeks wife noted labile BP's. With SBP as high as 200 mg/dL mainly during the evening and lower in the morning. His Doxazosin dose has been increased to 2 mg and BP has been more manageable .     HYPOGLYCEMIA HISTORY: Pt was admitted to [facility] on 08/31/2020 with a BG of 33 mg/dL during evaluation for altered mental status as well as hypotension. He was treated with IV dextrose through EMS BY his errival to the ED his Bg was 57 mg/dL.   He was treated medically for suspected insulinoma with diazoxide 09/04/2020 . CT pancreatic protocol negative for pancreatic mass . Of note the pt was also diagnosed with Enterococcus UTI and treated with Vancomycin   No biological children  SUBJECTIVE:    Today (03/26/6377):  Andres Stevens is here for a follow up on hypoglycemia and  elevated catecholamine.  He is accompanied by his wife today   He will be moving to an assisted living on 02/02/2021  He has not had any hypoglycemic episodes since his last  visit Dementia/confusion is worsening  He denies dizziness, or excessive sweating  Denies palpitations Denies change in bowel movements. Denies panic attacks but has been depressed    ENDOCRINE MEDICATIONS  Diazoxide 100 mg BID      HISTORY:  Past Medical History:  Past Medical History:  Diagnosis Date  . Bipolar 1 disorder ([place])   . Wears hearing aid    Past Surgical History:  Past Surgical History:  Procedure Laterality Date  . CERVICAL FUSION    . PARATHYROIDECTOMY  03/2016    Social History:  reports that he quit smoking about 5 years ago. His smoking use included cigarettes. He smoked 0.25 packs per day. He has never used smokeless tobacco. He reports current alcohol use. He reports that he does not use drugs. Family History:  Family History  Problem Relation Age of Onset  . Depression Father      HOME MEDICATIONS: Allergies as of 01/19/2021      Reactions   Bee Venom Anaphylaxis   Hx of local rxn      Medication List       Accurate as of January 19, 2021  1:54 PM. If you have any questions, ask your nurse or doctor.        acetaminophen 650 MG CR tablet Commonly known as: TYLENOL Take 2 tablets (1,300 mg total) by mouth every 8 (eight) hours as needed for pain.   allopurinol 300 MG tablet Commonly known as: ZYLOPRIM Take 1 tablet by mouth once daily   atorvastatin  40 MG tablet Commonly known as: LIPITOR Take 1 tablet by mouth once daily   B Complex-C Caps Take 1 capsule by mouth daily.   CETIRIZINE HCL PO Take 1 tablet by mouth daily.   Cholecalciferol 125 MCG (5000 UT) capsule Take 10,000 Units by mouth daily.   diazoxide 50 MG/ML suspension Commonly known as: PROGLYCEM Take 2 mLs (100 mg total) by mouth every 12 (twelve) hours.   doxazosin 1 MG tablet Commonly known as: CARDURA Take 1 mg by mouth daily.   lamoTRIgine 100 MG tablet Commonly known as: LAMICTAL Take 100 mg by mouth at bedtime.   OLANZapine 5 MG tablet Commonly  known as: ZYPREXA TAKE 1 TABLET BY MOUTH AT BEDTIME         OBJECTIVE:   PHYSICAL EXAM: VS: BP 120/76   Pulse 77   Ht 5\' 8"  (1.727 m)   Wt 163 lb 4 oz (74 kg)   SpO2 98%   BMI 24.82 kg/m    EXAM: General: Pt appears well and is in NAD  Lungs: Clear with good BS bilat with no rales, rhonchi, or wheezes  Heart: Auscultation: RRR.  Extremities:  BL LE: No pretibial edema normal ROM and strength.  Mental Status:  Mood and affect: No depression, anxiety, or agitation     DATA REVIEWED: Results for CHASETON, YEPIZ (MRN [id number]) as of 01/21/2021 09:11  Ref. Range 01/19/2021 13:34  Sodium Latest Ref Range: 135 - 145 mEq/L 138  Potassium Latest Ref Range: 3.5 - 5.1 mEq/L 4.6  Chloride Latest Ref Range: 96 - 112 mEq/L 102  CO2 Latest Ref Range: 19 - 32 mEq/L 27  Glucose Latest Ref Range: 70 - 99 mg/dL 93  BUN Latest Ref Range: 6 - 23 mg/dL 27 (H)  Creatinine Latest Ref Range: 0.40 - 1.50 mg/dL 1.20  Calcium Latest Ref Range: 8.4 - 10.5 mg/dL 9.7  Alkaline Phosphatase Latest Ref Range: 39 - 117 U/L 78  Albumin Latest Ref Range: 3.5 - 5.2 g/dL 4.4  AST Latest Ref Range: 0 - 37 U/L 16  ALT Latest Ref Range: 0 - 53 U/L 13  Total Protein Latest Ref Range: 6.0 - 8.3 g/dL 7.1  Total Bilirubin Latest Ref Range: 0.2 - 1.2 mg/dL 0.4  GFR Latest Ref Range: >60.00 mL/min 58.58 (L)        09/01/2020   3:58 AM  Insulin 2.7 uU/mL  Proinsulin 8.7 uU/mL    No concomitante serum glucose   But has a finger stick of 100 at 2:30 Am      CT abdomen /Pelvis 09/09/2020  CT ABDOMEN: Normal pancreas. No evidence for pancreatic tumor. Lung bases demonstrate a small right pleural effusion with dependent right lower lobe subsegmental atelectasis. Thick-walled esophagus suggesting esophagitis. Normal heart size. Normal liver, gallbladder, biliary tree, pancreas, spleen, and adrenal glands. Small kidneys with bilateral renal cortical cysts. Aortoiliac atherosclerosis without aneurysm. No  ascites or adenopathy.   CT PELVIS: Fat-containing bilateral inguinal hernias. Normal bladder, prostate, and seminal vesicles. Nonspecific gaseous distention of stomach, small bowel, and colon. No pelvic mass, adenopathy, or fluid collection. Multilevel lumbar DDD.    IMPRESSION: No evidence for pancreatic tumor.      Old records , labs and images have been reviewed.   ASSESSMENT / PLAN / RECOMMENDATIONS:   1. Hypoglycemia :   - High clinical suspicion for insulinoma , unfortunately no biochemical tests to confirm this it during hospitalizations (08/2020) and trying to confirm this bio chemically will cause hardship on  the patient due to worsening dementia.  - Negative CT imaging does NOT exclude insulinoma as they tend to be small and require endoscopic ultrasound  - But  Given co-morbidity it has been determined that treating hypoglycemia symptomatically is more beneficial at this time  - Diazoxide is a tier 5, will look for pt assistance programs    Medication Continue Diazoxide 100 mg BID   2. Elevated Catecholamine    - levels have been trending down and less then 2x upper limit of normal . Could be multifactorial given hypoglycemia and psychotropic medications.  - If levels higher then 2x upper limit of normal, will proceed with DOTATE PET scan  - Wife would like to avoid major surgeries which I agree with.    F/U in 6 months     Signed electronically by: Mack Guise, MD  [facility]  [facility] [address] [place], [place] [address] Phone: [phone] FAX: [phone]      CC: Hali Marry, [address] [place] [address] Phone: [phone]  Fax: [phone]   Return to Endocrinology clinic as below: Future Appointments  Date Time Provider [place]  01/21/2021 11:15 AM Trula Slade, DPM [facility] None  05/16/2021  1:20 PM Hali Marry, MD  [facility] None  07/27/2021  1:00 PM Carrie Schoonmaker, Melanie Crazier, MD [facility] None

## 2021-01-21 ENCOUNTER — Encounter: Payer: Self-pay | Admitting: Podiatry

## 2021-01-21 ENCOUNTER — Other Ambulatory Visit: Payer: Self-pay

## 2021-01-21 ENCOUNTER — Ambulatory Visit (INDEPENDENT_AMBULATORY_CARE_PROVIDER_SITE_OTHER): Payer: Medicare Other | Admitting: Podiatry

## 2021-01-21 DIAGNOSIS — M79674 Pain in right toe(s): Secondary | ICD-10-CM

## 2021-01-21 DIAGNOSIS — B351 Tinea unguium: Secondary | ICD-10-CM | POA: Diagnosis not present

## 2021-01-21 DIAGNOSIS — M79675 Pain in left toe(s): Secondary | ICD-10-CM

## 2021-01-21 NOTE — Progress Notes (Signed)
Subjective: 75 y.o. returns the office today for painful, elongated, thickened toenails which he cannot trim himself. Denies any redness or drainage around the nails. Denies any acute changes since last appointment and no new complaints today. Denies any systemic complaints such as fevers, chills, nausea, vomiting.   PCP: Hali Marry, MD   Objective: NAD- has vascular dementia  DP/PT pulses palpable, CRT less than 3 seconds Nails hypertrophic, dystrophic, elongated, brittle, discolored 10. There is tenderness overlying the nails 1-5 bilaterally. There is no surrounding erythema or drainage along the nail sites. No open lesions or pre-ulcerative lesions are identified. No pain with calf compression, swelling, warmth, erythema.  Assessment: Patient presents with symptomatic onychomycosis  Plan: -Treatment options including alternatives, risks, complications were discussed -Nails sharply debrided 10 without complication/bleeding. -Discussed daily foot inspection. If there are any changes, to call the office immediately.  -Follow-up in 3 months or sooner if any problems are to arise. In the meantime, encouraged to call the office with any questions, concerns, changes symptoms.  Celesta Gentile, DPM

## 2021-01-31 ENCOUNTER — Telehealth: Payer: Self-pay | Admitting: Internal Medicine

## 2021-01-31 MED ORDER — RELION LANCET DEVICES 30G MISC: 1.0000 | Freq: Every day | 3 refills | Status: DC

## 2021-01-31 MED ORDER — RELION TRUE METRIX TEST STRIPS VI STRP: 1.0000 | ORAL_STRIP | Freq: Every day | 3 refills | Status: DC

## 2021-01-31 NOTE — Telephone Encounter (Signed)
Patient's wife Arbie Cookey called re: Patient has been moved to [facility]. The facility Patient lived in prior prescribed the requested new Rx's listed below, but Arbie Cookey states since Dr. Kelton Pillar is treating Patient and patient no longer lives at the prescribing facility, Arbie Cookey requests the following new RX's:  MEDICATION:  Relion Lancets AND Relion Test Strips  PHARMACY:   [facility] (has their own PHARM/Needs RX faxed to them) Fax# [phone]  HAS THE PATIENT CONTACTED [facility]?  No-Has changed facilities  IS THIS A 90 DAY SUPPLY : Yes-If possible  IS PATIENT OUT OF MEDICATION: No  IF NOT; HOW MUCH IS LEFT: Approx. 20 days of both  LAST APPOINTMENT DATE: @4 /27/2022  NEXT APPOINTMENT DATE:@11 /10/2020  DO WE HAVE YOUR PERMISSION TO LEAVE A DETAILED MESSAGE?: Yes-on [name]'s VM  OTHER COMMENTS:    **Let patient know to contact pharmacy at the end of the day to make sure medication is ready. **  ** Please notify patient to allow 48-72 hours to process**  **Encourage patient to contact the pharmacy for refills or they can request refills through [facility]**

## 2021-01-31 NOTE — Telephone Encounter (Signed)
Faxed as instructed

## 2021-01-31 NOTE — Addendum Note (Signed)
Addended by: Dorita Sciara on: 01/31/2021 01:51 PM   Modules accepted: Orders

## 2021-01-31 NOTE — Telephone Encounter (Signed)
Please advise 

## 2021-02-04 LAB — CATECHOLAMINES, FRACTIONATED, PLASMA
Dopamine: 319 pg/mL — ABNORMAL HIGH
Epinephrine: <40 pg/mL
Norepinephrine: 918 pg/mL
Total Catecholamines: 1237 pg/mL — ABNORMAL HIGH

## 2021-02-04 LAB — METANEPHRINES, PLASMA
Metanephrine, Free: 26 pg/mL (ref <=57)
Normetanephrine, Free: 105 pg/mL (ref <=148)
Total Metanephrines-Plasma: 131 pg/mL (ref <=205)

## 2021-03-22 DIAGNOSIS — H5213 Myopia, bilateral: Secondary | ICD-10-CM | POA: Diagnosis not present

## 2021-03-22 DIAGNOSIS — H2513 Age-related nuclear cataract, bilateral: Secondary | ICD-10-CM | POA: Diagnosis not present

## 2021-03-23 ENCOUNTER — Encounter: Payer: Self-pay | Admitting: Family Medicine

## 2021-03-23 DIAGNOSIS — F3175 Bipolar disorder, in partial remission, most recent episode depressed: Secondary | ICD-10-CM | POA: Diagnosis not present

## 2021-03-23 DIAGNOSIS — G3184 Mild cognitive impairment, so stated: Secondary | ICD-10-CM | POA: Diagnosis not present

## 2021-03-23 DIAGNOSIS — F0150 Vascular dementia without behavioral disturbance: Secondary | ICD-10-CM

## 2021-03-25 NOTE — Addendum Note (Signed)
Addended by: Beatrice Lecher D on: 03/25/2021 11:58 AM   Modules accepted: Orders

## 2021-03-25 NOTE — Telephone Encounter (Signed)
Orders Placed This Encounter  Procedures   Ambulatory referral to Geriatrics    Referral Priority:   Routine    Referral Type:   Consultation    Referral Reason:   Specialty Services Required    Requested Specialty:   Geriatric Medicine    Number of Visits Requested:   1

## 2021-04-22 ENCOUNTER — Encounter: Payer: Self-pay | Admitting: Podiatry

## 2021-04-22 ENCOUNTER — Encounter: Payer: Self-pay | Admitting: Family Medicine

## 2021-04-22 ENCOUNTER — Ambulatory Visit (INDEPENDENT_AMBULATORY_CARE_PROVIDER_SITE_OTHER): Payer: Medicare Other | Admitting: Podiatry

## 2021-04-22 ENCOUNTER — Other Ambulatory Visit: Payer: Self-pay

## 2021-04-22 DIAGNOSIS — M79675 Pain in left toe(s): Secondary | ICD-10-CM | POA: Diagnosis not present

## 2021-04-22 DIAGNOSIS — M79674 Pain in right toe(s): Secondary | ICD-10-CM | POA: Diagnosis not present

## 2021-04-22 DIAGNOSIS — B353 Tinea pedis: Secondary | ICD-10-CM | POA: Diagnosis not present

## 2021-04-22 DIAGNOSIS — B351 Tinea unguium: Secondary | ICD-10-CM | POA: Diagnosis not present

## 2021-04-22 MED ORDER — KETOCONAZOLE 2 % EX CREA: 1.0000 "application " | TOPICAL_CREAM | Freq: Every day | CUTANEOUS | 0 refills | Status: DC

## 2021-04-22 NOTE — Patient Instructions (Signed)
Apply ketoconazole cream for the skin fungus in between the toes on the right foot once a day. Dry well after showering.   Ketoconazole skin cream What is this medication? KETOCONAZOLE (kee toe KON na zole) is an antifungal medicine. It is used on theskin. It treats skin infections caused by fungus. This medicine may be used for other purposes; ask your health care provider orpharmacist if you have questions. COMMON BRAND NAME(S): Kuric, Nizoral What should I tell my care team before I take this medication? They need to know if you have any of these conditions: large areas of burned or damaged skin an unusual or allergic reaction to ketoconazole, other medicines, foods, dyes, or preservatives pregnant or trying to get pregnant breast-feeding How should I use this medication? This medicine is for external use only. Do not take by mouth. Wash your hands before and after use. If you are treating your hands, only wash your hands before use. Do not use on healthy skin or over large areas of skin. Do not get this medicine in your eyes. If you do, rinse it out with plenty of cool tap water. Use it as directed on the label at the same time every day. Do not use it more often than directed or for a longer time period than prescribed by your health care provider. Keep taking it unless your health care provider tells youto stop. Apply a thin film to the affected area and rub gently. Do not bandage or wrap the skin being treated unless directed to do so by your doctor or health careprovider. Talk to your health care provider about the use of this medicine in children.Special care may be needed. Overdosage: If you think you have taken too much of this medicine contact apoison control center or emergency room at once. NOTE: This medicine is only for you. Do not share this medicine with others. What if I miss a dose? If you miss a dose, use it as soon as you can. If it is almost time for yournext dose, use only  that dose. Do not use double or extra doses. What may interact with this medication? Interactions are not expected. Do not use any other skin products withouttelling your doctor or health care professional. This list may not describe all possible interactions. Give your health care provider a list of all the medicines, herbs, non-prescription drugs, or dietary supplements you use. Also tell them if you smoke, drink alcohol, or use illegaldrugs. Some items may interact with your medicine. What should I watch for while using this medication? Visit your health care provider for regular checks on your progress. Tell your health care provider if your symptoms do not start to get better or if they getworse. After bathing, make sure your skin is very dry. Fungal infections like moistconditions. Do not walk around barefoot. To help prevent reinfection, wear freshly washed cotton, not synthetic, clothing. Tell your health care provider if you develop sores or blisters that do not heal properly. If your skin infection returns after you stop using thismedicine, contact your health care provider. If you are using this medicine for jock itch, do not wear underwear that is tight-fitting or made from synthetic fibers such as rayon or nylon. Instead,wear loose-fitting, cotton underwear. Dry the area completely after bathing. If you are using this medicine to treat athlete's foot, carefully dry the feet, especially between the toes after bathing. Do not wear socks made from wool or synthetic materials such as rayon or nylon.  Wear clean cotton socks and change them at least once a day. Wear sandals or shoes that are well-ventilated. An absorbent powder, such as talcum powder, may be used to keep the skin dry.Apply the powder to the affected skin in between applications of this medicine. What side effects may I notice from receiving this medication? Side effects that you should report to your doctor or health care provider  assoon as possible: allergic reactions (skin rash, itching or hives; swelling of the face, lips, or tongue) burning, crusting, itching, stinging, swelling, tenderness of skin Side effects that usually do not require medical attention (report to yourdoctor or health care provider if they continue or are bothersome): mild skin irritation, redness, or dryness This list may not describe all possible side effects. Call your doctor for medical advice about side effects. You may report side effects to FDA at1-800-FDA-1088. Where should I keep my medication? Keep out of the reach of children and pets. Store at room temperature between 20 and 25 degrees C (68 and 77 degrees F).Get rid of any unused medicine after the expiration date. NOTE: This sheet is a summary. It may not cover all possible information. If you have questions about this medicine, talk to your doctor, pharmacist, orhealth care provider.  2022 Elsevier/Gold Standard (2019-09-25 11:43:46)

## 2021-04-22 NOTE — Progress Notes (Signed)
Subjective: 75 y.o. returns the office today for painful, elongated, thickened toenails which he cannot trim himself. Denies any redness or drainage around the nails. Denies any acute changes since last appointment and no new complaints today. Denies any systemic complaints such as fevers, chills, nausea, vomiting.   PCP: Pcp, No  Objective: NAD DP/PT pulses palpable, CRT less than 3 seconds Nails hypertrophic, dystrophic, elongated, brittle, discolored 10. There is tenderness overlying the nails 1-5 bilaterally. There is no surrounding erythema or drainage along the nail sites. Interdigital tinea pedis present on right side without any drainage or pus or any pustules. No open lesions or pre-ulcerative lesions are identified. No other areas of tenderness bilateral lower extremities. No overlying edema, erythema, increased warmth. No pain with calf compression, swelling, warmth, erythema.  Assessment: Patient presents with symptomatic onychomycosis, tinea pedis  Plan: -Treatment options including alternatives, risks, complications were discussed -Nails sharply debrided 10 without complication/bleeding. -Prescribed ketoconazole for the tinea.  Dry thoroughly interdigitally after showering.  -Discussed daily foot inspection. If there are any changes, to call the office immediately.  -Follow-up in 3 months or sooner if any problems are to arise. In the meantime, encouraged to call the office with any questions, concerns, changes symptoms.  Celesta Gentile, DPM

## 2021-04-25 ENCOUNTER — Other Ambulatory Visit: Payer: Self-pay

## 2021-04-25 NOTE — Telephone Encounter (Signed)
Okay to send new order for extra tab of Cardura if systolic blood pressure greater than 145.

## 2021-04-25 NOTE — Telephone Encounter (Signed)
Just need to clarify is she talking about the Cardura 1 mg I think would be better for him to take it daily instead of as needed but we can always add it back and see how he does and make sure he is not going too low.

## 2021-04-26 MED ORDER — DOXAZOSIN MESYLATE 1 MG PO TABS
1.0000 mg | ORAL_TABLET | Freq: Every day | ORAL | 3 refills | Status: DC
Start: 2021-04-26 — End: 2021-09-09

## 2021-04-26 NOTE — Telephone Encounter (Signed)
[facility] (has their own PHARM/Needs RX faxed to them) Fax# [phone]

## 2021-05-16 ENCOUNTER — Ambulatory Visit: Payer: Medicare Other | Admitting: Family Medicine

## 2021-05-25 ENCOUNTER — Telehealth: Payer: Self-pay | Admitting: Family Medicine

## 2021-05-25 NOTE — Telephone Encounter (Signed)
Spoke with spouse she will call back to schedule awv  I gave her my number [phone]   awvs last awv 03/06/14/

## 2021-05-26 DIAGNOSIS — R5381 Other malaise: Secondary | ICD-10-CM | POA: Diagnosis not present

## 2021-05-26 DIAGNOSIS — I639 Cerebral infarction, unspecified: Secondary | ICD-10-CM | POA: Diagnosis not present

## 2021-05-26 DIAGNOSIS — F0151 Vascular dementia with behavioral disturbance: Secondary | ICD-10-CM | POA: Diagnosis not present

## 2021-05-26 DIAGNOSIS — F331 Major depressive disorder, recurrent, moderate: Secondary | ICD-10-CM | POA: Diagnosis not present

## 2021-06-08 ENCOUNTER — Telehealth: Payer: Self-pay | Admitting: *Deleted

## 2021-06-08 NOTE — Telephone Encounter (Signed)
LVM asking for call back.

## 2021-06-08 NOTE — Telephone Encounter (Signed)
Wanted to know if pt would be able to get out of the facility should an alarm go off.   I informed her that we had not seen him since April of this year and could not be certain of his ability to get out of a building with no assistance.

## 2021-06-08 NOTE — Telephone Encounter (Signed)
Juliann Pulse w/adult services lvm asked that I rtn her call she had some questions about Mr. Busko.

## 2021-06-16 ENCOUNTER — Ambulatory Visit: Payer: Self-pay

## 2021-06-23 ENCOUNTER — Encounter: Payer: Self-pay | Admitting: Family Medicine

## 2021-06-23 ENCOUNTER — Other Ambulatory Visit: Payer: Self-pay

## 2021-06-23 ENCOUNTER — Ambulatory Visit (INDEPENDENT_AMBULATORY_CARE_PROVIDER_SITE_OTHER): Payer: Medicare Other | Admitting: Family Medicine

## 2021-06-23 VITALS — BP 145/60 | HR 70 | Ht 68.0 in | Wt 171.0 lb

## 2021-06-23 DIAGNOSIS — M7989 Other specified soft tissue disorders: Secondary | ICD-10-CM

## 2021-06-23 DIAGNOSIS — K2100 Gastro-esophageal reflux disease with esophagitis, without bleeding: Secondary | ICD-10-CM | POA: Diagnosis not present

## 2021-06-23 DIAGNOSIS — Z23 Encounter for immunization: Secondary | ICD-10-CM | POA: Diagnosis not present

## 2021-06-23 DIAGNOSIS — R748 Abnormal levels of other serum enzymes: Secondary | ICD-10-CM | POA: Diagnosis not present

## 2021-06-23 DIAGNOSIS — F3170 Bipolar disorder, currently in remission, most recent episode unspecified: Secondary | ICD-10-CM | POA: Diagnosis not present

## 2021-06-23 DIAGNOSIS — F0150 Vascular dementia without behavioral disturbance: Secondary | ICD-10-CM | POA: Diagnosis not present

## 2021-06-23 DIAGNOSIS — E041 Nontoxic single thyroid nodule: Secondary | ICD-10-CM

## 2021-06-23 DIAGNOSIS — K219 Gastro-esophageal reflux disease without esophagitis: Secondary | ICD-10-CM | POA: Insufficient documentation

## 2021-06-23 DIAGNOSIS — N1831 Chronic kidney disease, stage 3a: Secondary | ICD-10-CM | POA: Diagnosis not present

## 2021-06-23 MED ORDER — PANTOPRAZOLE SODIUM 40 MG PO TBEC: 40.0000 mg | DELAYED_RELEASE_TABLET | Freq: Every morning | ORAL | 1 refills | Status: DC

## 2021-06-23 NOTE — Assessment & Plan Note (Signed)
He actually started to experience some GERD and heartburn when I had him lay back to do his abdominal exam nontender on exam though which is reassuring.  He thinks some of it may be related to eating quickly.  We did discuss putting him on a PPI he used to take pantoprazole and did well with it so we will start that back.  We did discuss that his symptoms should improve within 3 to 4 days.  We will keep him on it for about 6 months and at that point look at taking him back off.  He does think that some of his newer psychiatric medications could be causing a little bit more constipation recently so he can certainly discuss that with the psychiatrist.

## 2021-06-23 NOTE — Assessment & Plan Note (Signed)
Wife reports that his r recent Mini-Mental status exam score is around 11.  He is now in a nursing home for 24-hour care not currently on the memory unit but seems to be functioning well.  Follows with Dr. Leonarda Salon at [facility].

## 2021-06-23 NOTE — Assessment & Plan Note (Signed)
Dr. Leonarda Salon has now adjusted his medications he is currently on Lexapro, Seroquel and Namenda.  His wife reports that his Mini-Mental status exam has dropped from 17 down to 11.

## 2021-06-23 NOTE — Progress Notes (Signed)
Established Patient Office Visit  Subjective:  Patient ID: Andres Stevens, male    DOB: 07-26-1946  Age: 75 y.o. MRN: [id number]  CC:  Chief Complaint  Patient presents with   Follow-up    HPI Andres Stevens presents for   Increased GERD symptoms.  He started having more reflux and heartburn symptoms in the last 2 weeks he had this previously he actually used to see Dr. Janus Molder [facility].  Last time he was seen was December 2021.  He was placed on pantoprazole and really did well so we weaned him off and he has done well until about 2 weeks ago.  He currently lives in a nursing home.  His wife says overall he eats well.  He reports that sometimes the reflux wakes him up at night and sometimes it occurs after eating.  He did have diarrhea a couple of weeks ago when the symptoms first started evidently there was a stomach bug going around the nursing home so a lot of patients experience diarrhea.  But he feels like he might be a little bit more constipated in the last several days.  He has been belching more.  His wife did let me know that the geriatrician who is managing most of his medications recently changed him he is now on Lexapro, Seroquel and Namenda.  She did read that reflux could be a symptom of the Lexapro and he did start this medication a couple weeks ago around the time that his symptoms started.  Past Medical History:  Diagnosis Date   Bipolar 1 disorder ([place])    Wears hearing aid     Past Surgical History:  Procedure Laterality Date   CERVICAL FUSION     PARATHYROIDECTOMY  03/2016    Family History  Problem Relation Age of Onset   Depression Father     Social History   Socioeconomic History   Marital status: Married    Spouse name: Not on file   Number of children: Not on file   Years of education: Not on file   Highest education level: Not on file  Occupational History   Not on file  Tobacco Use   Smoking  status: Former    Packs/day: 0.25    Types: Cigarettes    Quit date: 06/26/2015    Years since quitting: 5.9   Smokeless tobacco: Never  Substance and Sexual Activity   Alcohol use: Yes    Alcohol/week: 0.0 standard drinks   Drug use: No   Sexual activity: Not on file  Other Topics Concern   Not on file  Social History Narrative   Not on file   Social Determinants of Health   Financial Resource Strain: Not on file  Food Insecurity: Not on file  Transportation Needs: Not on file  Physical Activity: Not on file  Stress: Not on file  Social Connections: Not on file  Intimate Partner Violence: Not on file    Outpatient Medications Prior to Visit  Medication Sig Dispense Refill   acetaminophen (TYLENOL) 650 MG CR tablet Take 2 tablets (1,300 mg total) by mouth every 8 (eight) hours as needed for pain. 90 tablet 3   allopurinol (ZYLOPRIM) 300 MG tablet Take 1 tablet by mouth once daily 90 tablet 0   atorvastatin (LIPITOR) 40 MG tablet Take 1 tablet by mouth once daily 90 tablet 3   B Complex-C CAPS Take 1 capsule by mouth daily.     CETIRIZINE HCL  PO Take 1 tablet by mouth daily.     Cholecalciferol 125 MCG (5000 UT) capsule Take 10,000 Units by mouth daily.     diazoxide (PROGLYCEM) 50 MG/ML suspension Take 2 mLs (100 mg total) by mouth every 12 (twelve) hours. 60 mL 11   doxazosin (CARDURA) 1 MG tablet Take 1 tablet (1 mg total) by mouth daily. extra tab of Cardura daily if systolic blood pressure greater than 145 45 tablet 3   escitalopram (LEXAPRO) 10 MG tablet Take 1 tablet by mouth daily.     glucose blood (RELION TRUE METRIX TEST STRIPS) test strip 1 each by Other route daily. Use as instructed 100 each 3   ketoconazole (NIZORAL) 2 % cream Apply 1 application topically daily. 60 g 0   lamoTRIgine (LAMICTAL) 100 MG tablet Take 100 mg by mouth at bedtime.     LORazepam (ATIVAN) 0.5 MG tablet      memantine (NAMENDA) 5 MG tablet Take 5 mg by mouth in the morning and at bedtime.      QUEtiapine (SEROQUEL) 25 MG tablet Take by mouth.     ReliOn Lancet Devices 30G MISC 1 Device by Does not apply route daily. 100 each 3   OLANZapine (ZYPREXA) 5 MG tablet TAKE 1 TABLET BY MOUTH AT BEDTIME 30 tablet 0   No facility-administered medications prior to visit.    Allergies  Allergen Reactions   Bee Venom Anaphylaxis    Hx of local rxn     ROS Review of Systems    Objective:    Physical Exam Constitutional:      Appearance: Normal appearance. He is well-developed.  HENT:     Head: Normocephalic and atraumatic.  Cardiovascular:     Rate and Rhythm: Normal rate and regular rhythm.     Heart sounds: Normal heart sounds.  Pulmonary:     Effort: Pulmonary effort is normal.     Breath sounds: Normal breath sounds.  Abdominal:     General: Abdomen is flat. Bowel sounds are normal.     Palpations: Abdomen is soft.  Skin:    General: Skin is warm and dry.  Neurological:     Mental Status: He is alert and oriented to person, place, and time. Mental status is at baseline.  Psychiatric:        Behavior: Behavior normal.    BP (!) 145/60   Pulse 70   Ht 5\' 8"  (1.727 m)   Wt 171 lb (77.6 kg)   SpO2 100%   BMI 26.00 kg/m  Wt Readings from Last 3 Encounters:  06/23/21 171 lb (77.6 kg)  01/19/21 163 lb 4 oz (74 kg)  01/19/21 164 lb (74.4 kg)     Health Maintenance Due  Topic Date Due   Zoster Vaccines- Shingrix (1 of 2) Never done   COVID-19 Vaccine (4 - Booster for Pfizer series) 09/15/2020    There are no preventive care reminders to display for this patient.  Lab Results  Component Value Date   TSH 4.39 08/26/2020   Lab Results  Component Value Date   WBC 5.5 08/26/2020   HGB 12.2 (L) 08/26/2020   HCT 35.0 (L) 08/26/2020   MCV 90.4 08/26/2020   PLT 141 08/26/2020   Lab Results  Component Value Date   NA 138 01/19/2021   K 4.6 01/19/2021   CO2 27 01/19/2021   GLUCOSE 93 01/19/2021   BUN 27 (H) 01/19/2021   CREATININE 1.20 01/19/2021    BILITOT 0.4 01/19/2021  ALKPHOS 78 01/19/2021   AST 16 01/19/2021   ALT 13 01/19/2021   PROT 7.1 01/19/2021   ALBUMIN 4.4 01/19/2021   CALCIUM 9.7 01/19/2021   GFR 58.58 (L) 01/19/2021   Lab Results  Component Value Date   CHOL 110 08/19/2019   Lab Results  Component Value Date   HDL 53 08/19/2019   Lab Results  Component Value Date   LDLCALC 23 08/19/2019   Lab Results  Component Value Date   TRIG 289 (H) 08/19/2019   Lab Results  Component Value Date   CHOLHDL 2.1 08/19/2019   Lab Results  Component Value Date   HGBA1C 4.6 02/17/2020      Assessment & Plan:   Problem List Items Addressed This Visit       Digestive   GERD (gastroesophageal reflux disease)    He actually started to experience some GERD and heartburn when I had him lay back to do his abdominal exam nontender on exam though which is reassuring.  He thinks some of it may be related to eating quickly.  We did discuss putting him on a PPI he used to take pantoprazole and did well with it so we will start that back.  We did discuss that his symptoms should improve within 3 to 4 days.  We will keep him on it for about 6 months and at that point look at taking him back off.  He does think that some of his newer psychiatric medications could be causing a little bit more constipation recently so he can certainly discuss that with the psychiatrist.      Relevant Medications   pantoprazole (PROTONIX) 40 MG tablet     Endocrine   Thyroid cyst   Relevant Orders   TSH     Nervous and Auditory   Vascular dementia without behavioral disturbance [facility])    Wife reports that his r recent Mini-Mental status exam score is around 11.  He is now in a nursing home for 24-hour care not currently on the memory unit but seems to be functioning well.  Follows with Dr. Leonarda Salon at [facility].      Relevant Medications   LORazepam (ATIVAN) 0.5 MG tablet   escitalopram (LEXAPRO) 10 MG tablet   QUEtiapine (SEROQUEL)  25 MG tablet   memantine (NAMENDA) 5 MG tablet     Genitourinary   CKD (chronic kidney disease) stage 3, GFR 30-59 ml/min (HCC) - Primary   Relevant Orders   CBC   COMPLETE METABOLIC PANEL WITH GFR     Other   Bipolar disorder (HCC)    Dr. Leonarda Salon has now adjusted his medications he is currently on Lexapro, Seroquel and Namenda.  His wife reports that his Mini-Mental status exam has dropped from 17 down to 11.      Other Visit Diagnoses     Flu vaccine need       Relevant Orders   Flu Vaccine QUAD High Dose(Fluad) (Completed)   Swelling of lower extremity       Relevant Orders   CBC   TSH   COMPLETE METABOLIC PANEL WITH GFR      He did bring in his med list from the nursing home which I signed off on today.  Meds ordered this encounter  Medications   pantoprazole (PROTONIX) 40 MG tablet    Sig: Take 1 tablet (40 mg total) by mouth in the morning.    Dispense:  90 tablet    Refill:  1  Follow-up: Return in about 6 months (around 12/21/2021), or if symptoms worsen or fail to improve, for medications/GERD.    Beatrice Lecher, MD

## 2021-06-24 LAB — CBC
HCT: 39 % (ref 38.5–50.0)
Hemoglobin: 13.6 g/dL (ref 13.2–17.1)
MCH: 31.6 pg (ref 27.0–33.0)
MCHC: 34.9 g/dL (ref 32.0–36.0)
MCV: 90.7 fL (ref 80.0–100.0)
MPV: 9.5 fL (ref 7.5–12.5)
Platelets: 155 10*3/uL (ref 140–400)
RBC: 4.3 10*6/uL (ref 4.20–5.80)
RDW: 13.4 % (ref 11.0–15.0)
WBC: 5.5 10*3/uL (ref 3.8–10.8)

## 2021-06-24 LAB — COMPLETE METABOLIC PANEL WITH GFR
AG Ratio: 2.3 (calc) (ref 1.0–2.5)
ALT: 11 U/L (ref 9–46)
AST: 15 U/L (ref 10–35)
Albumin: 4.6 g/dL (ref 3.6–5.1)
Alkaline phosphatase (APISO): 69 U/L (ref 35–144)
BUN: 20 mg/dL (ref 7–25)
CO2: 28 mmol/L (ref 20–32)
Calcium: 9.4 mg/dL (ref 8.6–10.3)
Chloride: 103 mmol/L (ref 98–110)
Creat: 1.09 mg/dL (ref 0.70–1.28)
Globulin: 2 g/dL (calc) (ref 1.9–3.7)
Glucose, Bld: 95 mg/dL (ref 65–99)
Potassium: 4 mmol/L (ref 3.5–5.3)
Sodium: 140 mmol/L (ref 135–146)
Total Bilirubin: 0.6 mg/dL (ref 0.2–1.2)
Total Protein: 6.6 g/dL (ref 6.1–8.1)
eGFR: 70 mL/min/{1.73_m2} (ref >=60)

## 2021-06-24 LAB — TSH: TSH: 1.69 mIU/L (ref 0.40–4.50)

## 2021-06-24 NOTE — Progress Notes (Signed)
Your lab work is within acceptable range and there are no concerning findings.   ?

## 2021-07-01 ENCOUNTER — Encounter: Payer: Self-pay | Admitting: Podiatry

## 2021-07-01 ENCOUNTER — Other Ambulatory Visit: Payer: Self-pay

## 2021-07-01 ENCOUNTER — Ambulatory Visit (INDEPENDENT_AMBULATORY_CARE_PROVIDER_SITE_OTHER): Payer: Medicare Other | Admitting: Podiatry

## 2021-07-01 DIAGNOSIS — M79675 Pain in left toe(s): Secondary | ICD-10-CM | POA: Diagnosis not present

## 2021-07-01 DIAGNOSIS — B351 Tinea unguium: Secondary | ICD-10-CM | POA: Diagnosis not present

## 2021-07-01 DIAGNOSIS — M79674 Pain in right toe(s): Secondary | ICD-10-CM | POA: Diagnosis not present

## 2021-07-01 NOTE — Progress Notes (Signed)
Subjective: 75 y.o. returns the office today for painful, elongated, thickened toenails which he cannot trim himself. Denies any redness or drainage around the nails. Denies any acute changes since last appointment and no new complaints today. Denies any systemic complaints such as fevers, chills, nausea, vomiting.   PCP: Pcp, No  Objective: NAD DP/PT pulses palpable, CRT less than 3 seconds Nails hypertrophic, dystrophic, elongated, brittle, discolored 10. There is tenderness overlying the nails 1-5 bilaterally. There is no surrounding erythema or drainage along the nail sites. Tinea pedis has improved.  No open lesions or pre-ulcerative lesions are identified. No other areas of tenderness bilateral lower extremities. No overlying edema, erythema, increased warmth. No pain with calf compression, swelling, warmth, erythema.  Assessment: Patient presents with symptomatic onychomycosis.  Plan: -Treatment options including alternatives, risks, complications were discussed -Nails sharply debrided 10 without complication/bleeding. -May discontinue ketoconazole  -Discussed daily foot inspection. If there are any changes, to call the office immediately.  -Follow-up in 3 months or sooner if any problems are to arise. In the meantime, encouraged to call the office with any questions, concerns, changes symptoms.  Lorenda Peck, DPM

## 2021-07-08 ENCOUNTER — Encounter: Payer: Self-pay | Admitting: Family Medicine

## 2021-07-11 ENCOUNTER — Other Ambulatory Visit: Payer: Self-pay | Admitting: Medical-Surgical

## 2021-07-11 NOTE — Telephone Encounter (Signed)
Metheney pt

## 2021-07-12 ENCOUNTER — Encounter: Payer: Self-pay | Admitting: Internal Medicine

## 2021-07-19 MED ORDER — COMPRESSION BANDAGES MISC: 99 refills | Status: DC

## 2021-07-19 NOTE — Telephone Encounter (Signed)
Meds ordered this encounter  Medications   Compression Bandages MISC    Sig: Below the knee compression stockings.  Pressure 20 mmHg to be applied in AM and removed before bedtime daily.    Dispense:  2 Units    Refill:  PRN

## 2021-07-27 ENCOUNTER — Encounter: Payer: Self-pay | Admitting: Internal Medicine

## 2021-07-27 ENCOUNTER — Ambulatory Visit (INDEPENDENT_AMBULATORY_CARE_PROVIDER_SITE_OTHER): Payer: Medicare Other | Admitting: Internal Medicine

## 2021-07-27 ENCOUNTER — Other Ambulatory Visit: Payer: Self-pay

## 2021-07-27 VITALS — BP 122/80 | HR 62 | Ht 68.0 in | Wt 177.0 lb

## 2021-07-27 DIAGNOSIS — E162 Hypoglycemia, unspecified: Secondary | ICD-10-CM

## 2021-07-27 MED ORDER — RELION LANCET DEVICES 30G MISC
1.0000 | Freq: Every day | 3 refills | Status: DC
Start: 2021-07-27 — End: 2023-10-10

## 2021-07-27 MED ORDER — RELION TRUE METRIX TEST STRIPS VI STRP: 1.0000 | ORAL_STRIP | Freq: Every day | 3 refills | Status: DC

## 2021-07-27 MED ORDER — DIAZOXIDE 50 MG/ML PO SUSP: 100.0000 mg | Freq: Two times a day (BID) | ORAL | 11 refills | Status: DC

## 2021-07-27 NOTE — Telephone Encounter (Signed)
Script sent to [facility] per wife request

## 2021-07-27 NOTE — Patient Instructions (Signed)
-   Diazoxide 100 mg TWICE daily

## 2021-07-27 NOTE — Progress Notes (Signed)
Name: Andres Stevens  MRN/ DOB: [id number], November 13, 1945    Age/ Sex: 75 y.o., male     PCP: Hali Marry, MD   Reason for Endocrinology Evaluation: Elevated Catecholamine     Initial Endocrinology Clinic Visit: 08/30/2020    PATIENT IDENTIFIER: Andres Stevens is a 75 y.o., male with a past medical history of Dyslipidemia, bipolar disorder and history of hemorrhagic stroke, S/P parathyroidectomy and vascular dementia . He has followed with [place] [facility] since 08/30/2020 for consultative assistance with management of his elevated catecholamine.      HISTORICAL SUMMARY:  During evaluation for elevated BP readings at home in 07/2120 he was noted to have elevated plasma norepinephrine at 1973 pg/mL, and normetanephrine at 513 pg/ml but with normal epinephrine and metanephrines. His abdominal CT on 08/09/2020 was unrevealing.    He has been on doxazosin  since hemorrhagic stroke ~ 2016 . IN the past few weeks wife noted labile BP's. With SBP as high as 200 mg/dL mainly during the evening and lower in the morning. His Doxazosin dose has been increased to 2 mg and BP has been more manageable .      HYPOGLYCEMIA HISTORY: Pt was admitted to [facility] on 08/31/2020 with a BG of 33 mg/dL during evaluation for altered mental status as well as hypotension. He was treated with IV dextrose through EMS BY his errival to the ED his Bg was 57 mg/dL.   He was treated medically for suspected insulinoma with diazoxide 09/04/2020 . CT pancreatic protocol negative for pancreatic mass . Of note the pt was also diagnosed with Enterococcus UTI and treated with Vancomycin   No biological children  SUBJECTIVE:    Today (85/10/7780):  [name] is here for a follow up on hypoglycemia and  elevated catecholamine.  He is accompanied by his wife today   He will be moving to an assisted living on 02/02/2021  He has not had any hypoglycemic episodes since his last visit, his BG's  average 90-120 MG/DL at the facility Dementia/confusion is improving  since being on Namenda and Seroquel  He denies dizziness, or excessive sweating  Denies palpitations Has heartburn   ENDOCRINE MEDICATIONS Diazoxide 100 mg BID      HISTORY:  Past Medical History:  Past Medical History:  Diagnosis Date   Bipolar 1 disorder ([place])    Wears hearing aid    Past Surgical History:  Past Surgical History:  Procedure Laterality Date   CERVICAL FUSION     PARATHYROIDECTOMY  03/2016   Social History:  reports that he quit smoking about 6 years ago. His smoking use included cigarettes. He smoked an average of .25 packs per day. He has never used smokeless tobacco. He reports current alcohol use. He reports that he does not use drugs. Family History:  Family History  Problem Relation Age of Onset   Depression Father      HOME MEDICATIONS: Allergies as of 07/27/2021       Reactions   Bee Venom Anaphylaxis   Hx of local rxn        Medication List        Accurate as of July 27, 2021  1:25 PM. If you have any questions, ask your nurse or doctor.          acetaminophen 650 MG CR tablet Commonly known as: TYLENOL Take 2 tablets (1,300 mg total) by mouth every 8 (eight) hours as needed for pain.   allopurinol 300 MG tablet Commonly  known as: ZYLOPRIM Take 1 tablet by mouth once daily   atorvastatin 40 MG tablet Commonly known as: LIPITOR Take 1 tablet by mouth once daily   B Complex-C Caps Take 1 capsule by mouth daily.   CETIRIZINE HCL PO Take 1 tablet by mouth daily.   Cholecalciferol 125 MCG (5000 UT) capsule Take 10,000 Units by mouth daily.   Compression Bandages Misc Below the knee compression stockings.  Pressure 20 mmHg to be applied in AM and removed before bedtime daily.   diazoxide 50 MG/ML suspension Commonly known as: PROGLYCEM Take 2 mLs (100 mg total) by mouth every 12 (twelve) hours.   doxazosin 2 MG tablet Commonly known as:  CARDURA Take by mouth.   doxazosin 1 MG tablet Commonly known as: CARDURA Take 1 tablet (1 mg total) by mouth daily. extra tab of Cardura daily if systolic blood pressure greater than 145   escitalopram 10 MG tablet Commonly known as: LEXAPRO Take 1 tablet by mouth daily.   ketoconazole 2 % cream Commonly known as: NIZORAL Apply 1 application topically daily.   lamoTRIgine 100 MG tablet Commonly known as: LAMICTAL Take 100 mg by mouth at bedtime.   LORazepam 0.5 MG tablet Commonly known as: ATIVAN   memantine 5 MG tablet Commonly known as: NAMENDA Take 5 mg by mouth in the morning and at bedtime.   pantoprazole 40 MG tablet Commonly known as: PROTONIX Take 1 tablet (40 mg total) by mouth in the morning.   QUEtiapine 25 MG tablet Commonly known as: SEROQUEL Take by mouth.   ReliOn Lancet Devices 30G Misc 1 Device by Does not apply route daily.   ReliOn True Metrix Test Strips test strip Generic drug: glucose blood 1 each by Other route daily. Use as instructed          OBJECTIVE:   PHYSICAL EXAM: VS: BP 122/80 (BP Location: Left Arm, Patient Position: Sitting, Cuff Size: Small)   Pulse 62   Ht 5' 8" (1.727 m)   Wt 177 lb (80.3 kg)   SpO2 98%   BMI 26.91 kg/m    EXAM: General: Pt appears well and is in NAD  Lungs: Clear with good BS bilat with no rales, rhonchi, or wheezes  Heart: Auscultation: RRR.  Extremities:  BL LE: 1+  pretibial edema   Mental Status:  Mood and affect: No depression, anxiety, or agitation     DATA REVIEWED:  Results for ALEEM, ELZA (MRN [id number]) as of 07/27/2021 13:17  Ref. Range 06/23/2021 12:06  Sodium Latest Ref Range: 135 - 146 mmol/L 140  Potassium Latest Ref Range: 3.5 - 5.3 mmol/L 4.0  Chloride Latest Ref Range: 98 - 110 mmol/L 103  CO2 Latest Ref Range: 20 - 32 mmol/L 28  Glucose Latest Ref Range: 65 - 99 mg/dL 95  BUN Latest Ref Range: 7 - 25 mg/dL 20  Creatinine Latest Ref Range: 0.70 - 1.28 mg/dL  1.09  Calcium Latest Ref Range: 8.6 - 10.3 mg/dL 9.4  BUN/Creatinine Ratio Latest Ref Range: 6 - 22 (calc) NOT APPLICABLE  eGFR Latest Ref Range: > OR = 60 mL/min/1.67m 70  AG Ratio Latest Ref Range: 1.0 - 2.5 (calc) 2.3  AST Latest Ref Range: 10 - 35 U/L 15  ALT Latest Ref Range: 9 - 46 U/L 11  Total Protein Latest Ref Range: 6.1 - 8.1 g/dL 6.6  Total Bilirubin Latest Ref Range: 0.2 - 1.2 mg/dL 0.6      09/01/2020   3:58 AM  Insulin  2.7 uU/mL  Proinsulin 8.7 uU/mL    No concomitante serum glucose   But has a finger stick of 100 at 2:30 Am      CT abdomen /Pelvis 09/09/2020  CT ABDOMEN: Normal pancreas. No evidence for pancreatic tumor. Lung bases demonstrate a small right pleural effusion with dependent right lower lobe subsegmental atelectasis. Thick-walled esophagus suggesting esophagitis. Normal heart size. Normal liver, gallbladder, biliary tree, pancreas, spleen, and adrenal glands. Small kidneys with bilateral renal cortical cysts. Aortoiliac atherosclerosis without aneurysm. No ascites or adenopathy.   CT PELVIS: Fat-containing bilateral inguinal hernias. Normal bladder, prostate, and seminal vesicles. Nonspecific gaseous distention of stomach, small bowel, and colon. No pelvic mass, adenopathy, or fluid collection. Multilevel lumbar DDD.    IMPRESSION: No evidence for pancreatic tumor.      Old records , labs and images have been reviewed.   ASSESSMENT / PLAN / RECOMMENDATIONS:   1. Hypoglycemia :   - High clinical suspicion for insulinoma , unfortunately no biochemical tests to confirm this it during hospitalizations (08/2020) and trying to confirm this bio chemically will cause hardship on the patient due to  dementia.  - Negative CT imaging does NOT exclude insulinoma as they tend to be small and require endoscopic ultrasound  -I did explain the above to the patient's wife, I did explain to her that the most likely cause for his hypoglycemic episodes are  pancreatic tumors that are secreting insulin.  I did explain to Mrs.Coutant that these tumors are mostly benign but they do have a rare tendency for malignancy, I also explained to her that the best modality to find these tumors is through endoscopic ultrasound.   -Given co-morbidities and dementia, wife opted with conservative treatment, which I agree with.  Comfort care and symptomatic care will be provided when necessary -He has been noted with lower extremity edema, this could be a side effect from diazoxide, I have advised the wife to make sure that the staff responsible for giving the patient the diazoxide is giving the correct dose, as this could be confusing and I am worried that he is getting more than prescribed   Medication Continue Diazoxide 100 mg BID   2. Elevated Catecholamine    -This has been elevated as a secondary response to severe hyperglycemia, his catecholamine levels has trended down significantly with proper treatment of hypoglycemia   F/U in 1 year   Signed electronically by: Mack Guise, MD  [facility] Endocrinology  [facility] [address] [place], [place] [address] Phone: [phone] FAX: [phone]      CC: Hali Marry, [address] [place] [address] Phone: [phone]  Fax: [phone]   Return to Endocrinology clinic as below: Future Appointments  Date Time Provider [place]  10/07/2021 10:00 AM Lorenda Peck, MD [facility] None  12/21/2021 10:30 AM Hali Marry, MD [facility] None

## 2021-08-09 ENCOUNTER — Encounter: Payer: Self-pay | Admitting: Family Medicine

## 2021-08-10 ENCOUNTER — Ambulatory Visit (INDEPENDENT_AMBULATORY_CARE_PROVIDER_SITE_OTHER): Payer: Medicare Other | Admitting: Sports Medicine

## 2021-08-10 ENCOUNTER — Ambulatory Visit (INDEPENDENT_AMBULATORY_CARE_PROVIDER_SITE_OTHER): Payer: Medicare Other

## 2021-08-10 ENCOUNTER — Other Ambulatory Visit: Payer: Self-pay

## 2021-08-10 DIAGNOSIS — M25512 Pain in left shoulder: Secondary | ICD-10-CM | POA: Diagnosis not present

## 2021-08-10 DIAGNOSIS — M542 Cervicalgia: Secondary | ICD-10-CM

## 2021-08-10 DIAGNOSIS — M5030 Other cervical disc degeneration, unspecified cervical region: Secondary | ICD-10-CM

## 2021-08-10 DIAGNOSIS — M25511 Pain in right shoulder: Secondary | ICD-10-CM | POA: Diagnosis not present

## 2021-08-10 MED ORDER — PREDNISONE 50 MG PO TABS: ORAL_TABLET | ORAL | 0 refills | Status: DC

## 2021-08-10 NOTE — Progress Notes (Signed)
    Procedures performed today:    None.  Independent interpretation of notes and tests performed by another provider:   None.  Brief History, Exam, Impression, and Recommendations:    DDD (degenerative disc disease), cervical This is a very pleasant 75 year old male, history of Alzheimer's dementia, currently residing in an assisted living facility, he has had increasing pain in the neck with radiation down into both upper shoulders, sometimes down to the hands and fingertips and sometimes in the left pericranium posteriorly. He did have x-rays about 2 years ago that showed cervical DDD, we will treat this conservatively, updated x-rays, 5 days of prednisone, home health physical therapy at the assisted living facility, return to see me in 4 to 6 weeks, MRI for interventional planning if no better.    ___________________________________________ Gwen Her. Dianah Field, M.D., ABFM., CAQSM. Primary Care and [place] Instructor of [place] of [facility] of Medicine

## 2021-08-10 NOTE — Assessment & Plan Note (Signed)
This is a very pleasant 75 year old male, history of Alzheimer's dementia, currently residing in an assisted living facility, he has had increasing pain in the neck with radiation down into both upper shoulders, sometimes down to the hands and fingertips and sometimes in the left pericranium posteriorly. He did have x-rays about 2 years ago that showed cervical DDD, we will treat this conservatively, updated x-rays, 5 days of prednisone, home health physical therapy at the assisted living facility, return to see me in 4 to 6 weeks, MRI for interventional planning if no better.

## 2021-08-15 DIAGNOSIS — G309 Alzheimer's disease, unspecified: Secondary | ICD-10-CM | POA: Diagnosis not present

## 2021-08-15 DIAGNOSIS — F0280 Dementia in other diseases classified elsewhere without behavioral disturbance: Secondary | ICD-10-CM | POA: Diagnosis not present

## 2021-08-15 DIAGNOSIS — E162 Hypoglycemia, unspecified: Secondary | ICD-10-CM | POA: Diagnosis not present

## 2021-08-15 DIAGNOSIS — F319 Bipolar disorder, unspecified: Secondary | ICD-10-CM | POA: Diagnosis not present

## 2021-08-15 DIAGNOSIS — M5031 Other cervical disc degeneration,  high cervical region: Secondary | ICD-10-CM | POA: Diagnosis not present

## 2021-08-15 DIAGNOSIS — Z9181 History of falling: Secondary | ICD-10-CM | POA: Diagnosis not present

## 2021-08-23 DIAGNOSIS — E162 Hypoglycemia, unspecified: Secondary | ICD-10-CM | POA: Diagnosis not present

## 2021-08-23 DIAGNOSIS — M5031 Other cervical disc degeneration,  high cervical region: Secondary | ICD-10-CM | POA: Diagnosis not present

## 2021-08-23 DIAGNOSIS — F319 Bipolar disorder, unspecified: Secondary | ICD-10-CM | POA: Diagnosis not present

## 2021-08-23 DIAGNOSIS — G309 Alzheimer's disease, unspecified: Secondary | ICD-10-CM | POA: Diagnosis not present

## 2021-08-23 DIAGNOSIS — Z9181 History of falling: Secondary | ICD-10-CM | POA: Diagnosis not present

## 2021-08-23 DIAGNOSIS — F0280 Dementia in other diseases classified elsewhere without behavioral disturbance: Secondary | ICD-10-CM | POA: Diagnosis not present

## 2021-08-25 DIAGNOSIS — F0390 Unspecified dementia without behavioral disturbance: Secondary | ICD-10-CM | POA: Diagnosis not present

## 2021-08-25 DIAGNOSIS — Z79899 Other long term (current) drug therapy: Secondary | ICD-10-CM | POA: Diagnosis not present

## 2021-08-25 DIAGNOSIS — E785 Hyperlipidemia, unspecified: Secondary | ICD-10-CM | POA: Diagnosis not present

## 2021-08-25 DIAGNOSIS — Z87891 Personal history of nicotine dependence: Secondary | ICD-10-CM | POA: Diagnosis not present

## 2021-08-25 DIAGNOSIS — R519 Headache, unspecified: Secondary | ICD-10-CM | POA: Diagnosis not present

## 2021-08-25 DIAGNOSIS — R410 Disorientation, unspecified: Secondary | ICD-10-CM | POA: Diagnosis not present

## 2021-08-25 DIAGNOSIS — G319 Degenerative disease of nervous system, unspecified: Secondary | ICD-10-CM | POA: Diagnosis not present

## 2021-08-25 DIAGNOSIS — F319 Bipolar disorder, unspecified: Secondary | ICD-10-CM | POA: Diagnosis not present

## 2021-08-25 DIAGNOSIS — R9082 White matter disease, unspecified: Secondary | ICD-10-CM | POA: Diagnosis not present

## 2021-08-25 DIAGNOSIS — Z91030 Bee allergy status: Secondary | ICD-10-CM | POA: Diagnosis not present

## 2021-08-26 DIAGNOSIS — F319 Bipolar disorder, unspecified: Secondary | ICD-10-CM | POA: Diagnosis not present

## 2021-08-26 DIAGNOSIS — E162 Hypoglycemia, unspecified: Secondary | ICD-10-CM | POA: Diagnosis not present

## 2021-08-26 DIAGNOSIS — G309 Alzheimer's disease, unspecified: Secondary | ICD-10-CM | POA: Diagnosis not present

## 2021-08-26 DIAGNOSIS — F0280 Dementia in other diseases classified elsewhere without behavioral disturbance: Secondary | ICD-10-CM | POA: Diagnosis not present

## 2021-08-26 DIAGNOSIS — Z9181 History of falling: Secondary | ICD-10-CM | POA: Diagnosis not present

## 2021-08-26 DIAGNOSIS — M5031 Other cervical disc degeneration,  high cervical region: Secondary | ICD-10-CM | POA: Diagnosis not present

## 2021-08-29 DIAGNOSIS — E162 Hypoglycemia, unspecified: Secondary | ICD-10-CM | POA: Diagnosis not present

## 2021-08-29 DIAGNOSIS — F319 Bipolar disorder, unspecified: Secondary | ICD-10-CM | POA: Diagnosis not present

## 2021-08-29 DIAGNOSIS — G309 Alzheimer's disease, unspecified: Secondary | ICD-10-CM | POA: Diagnosis not present

## 2021-08-29 DIAGNOSIS — Z9181 History of falling: Secondary | ICD-10-CM | POA: Diagnosis not present

## 2021-08-29 DIAGNOSIS — F0280 Dementia in other diseases classified elsewhere without behavioral disturbance: Secondary | ICD-10-CM | POA: Diagnosis not present

## 2021-08-29 DIAGNOSIS — M5031 Other cervical disc degeneration,  high cervical region: Secondary | ICD-10-CM | POA: Diagnosis not present

## 2021-08-31 DIAGNOSIS — M5031 Other cervical disc degeneration,  high cervical region: Secondary | ICD-10-CM | POA: Diagnosis not present

## 2021-08-31 DIAGNOSIS — F0280 Dementia in other diseases classified elsewhere without behavioral disturbance: Secondary | ICD-10-CM | POA: Diagnosis not present

## 2021-08-31 DIAGNOSIS — G309 Alzheimer's disease, unspecified: Secondary | ICD-10-CM | POA: Diagnosis not present

## 2021-08-31 DIAGNOSIS — E162 Hypoglycemia, unspecified: Secondary | ICD-10-CM | POA: Diagnosis not present

## 2021-08-31 DIAGNOSIS — Z9181 History of falling: Secondary | ICD-10-CM | POA: Diagnosis not present

## 2021-08-31 DIAGNOSIS — F319 Bipolar disorder, unspecified: Secondary | ICD-10-CM | POA: Diagnosis not present

## 2021-09-02 ENCOUNTER — Other Ambulatory Visit: Payer: Self-pay | Admitting: *Deleted

## 2021-09-02 DIAGNOSIS — M1711 Unilateral primary osteoarthritis, right knee: Secondary | ICD-10-CM

## 2021-09-02 MED ORDER — ACETAMINOPHEN ER 650 MG PO TBCR: 1300.0000 mg | EXTENDED_RELEASE_TABLET | Freq: Three times a day (TID) | ORAL | 3 refills | Status: DC | PRN

## 2021-09-07 ENCOUNTER — Other Ambulatory Visit: Payer: Self-pay | Admitting: Medical-Surgical

## 2021-09-07 DIAGNOSIS — M79674 Pain in right toe(s): Secondary | ICD-10-CM

## 2021-09-07 NOTE — Telephone Encounter (Signed)
[place] pt.

## 2021-09-08 DIAGNOSIS — F0280 Dementia in other diseases classified elsewhere without behavioral disturbance: Secondary | ICD-10-CM | POA: Diagnosis not present

## 2021-09-08 DIAGNOSIS — F319 Bipolar disorder, unspecified: Secondary | ICD-10-CM | POA: Diagnosis not present

## 2021-09-08 DIAGNOSIS — E162 Hypoglycemia, unspecified: Secondary | ICD-10-CM | POA: Diagnosis not present

## 2021-09-08 DIAGNOSIS — M5031 Other cervical disc degeneration,  high cervical region: Secondary | ICD-10-CM | POA: Diagnosis not present

## 2021-09-08 DIAGNOSIS — G309 Alzheimer's disease, unspecified: Secondary | ICD-10-CM | POA: Diagnosis not present

## 2021-09-08 DIAGNOSIS — Z9181 History of falling: Secondary | ICD-10-CM | POA: Diagnosis not present

## 2021-09-14 DIAGNOSIS — F319 Bipolar disorder, unspecified: Secondary | ICD-10-CM | POA: Diagnosis not present

## 2021-09-14 DIAGNOSIS — M5031 Other cervical disc degeneration,  high cervical region: Secondary | ICD-10-CM | POA: Diagnosis not present

## 2021-09-14 DIAGNOSIS — E162 Hypoglycemia, unspecified: Secondary | ICD-10-CM | POA: Diagnosis not present

## 2021-09-14 DIAGNOSIS — G309 Alzheimer's disease, unspecified: Secondary | ICD-10-CM | POA: Diagnosis not present

## 2021-09-14 DIAGNOSIS — Z9181 History of falling: Secondary | ICD-10-CM | POA: Diagnosis not present

## 2021-09-14 DIAGNOSIS — F0280 Dementia in other diseases classified elsewhere without behavioral disturbance: Secondary | ICD-10-CM | POA: Diagnosis not present

## 2021-09-21 ENCOUNTER — Encounter: Payer: Self-pay | Admitting: Family Medicine

## 2021-09-21 ENCOUNTER — Ambulatory Visit: Payer: Medicare Other | Admitting: Sports Medicine

## 2021-09-21 MED ORDER — BENZONATATE 200 MG PO CAPS: 200.0000 mg | ORAL_CAPSULE | Freq: Two times a day (BID) | ORAL | 0 refills | Status: DC | PRN

## 2021-09-21 MED ORDER — NIRMATRELVIR/RITONAVIR (PAXLOVID) TABLET (RENAL DOSING): 2.0000 | ORAL_TABLET | Freq: Two times a day (BID) | ORAL | 0 refills | Status: AC

## 2021-09-21 NOTE — Telephone Encounter (Signed)
Tell her I am sorry.   Meds ordered this encounter  Medications   benzonatate (TESSALON) 200 MG capsule    Sig: Take 1 capsule (200 mg total) by mouth 2 (two) times daily as needed for cough.    Dispense:  20 capsule    Refill:  0   nirmatrelvir/ritonavir EUA, renal dosing, (PAXLOVID) 10 x 150 MG & 10 x 100MG  TABS    Sig: Take 2 tablets by mouth 2 (two) times daily for 5 days. (Take nirmatrelvir 150 mg one tablet twice daily for 5 days and ritonavir 100 mg one tablet twice daily for 5 days) Patient GFR is 54    Dispense:  20 tablet    Refill:  0

## 2021-09-22 ENCOUNTER — Ambulatory Visit: Payer: Medicare Other | Admitting: Sports Medicine

## 2021-10-07 ENCOUNTER — Ambulatory Visit: Payer: Medicare Other | Admitting: Podiatry

## 2021-10-10 ENCOUNTER — Telehealth (INDEPENDENT_AMBULATORY_CARE_PROVIDER_SITE_OTHER): Payer: Medicare Other | Admitting: Physician Assistant

## 2021-10-10 ENCOUNTER — Encounter: Payer: Self-pay | Admitting: Family Medicine

## 2021-10-10 ENCOUNTER — Encounter: Payer: Self-pay | Admitting: Physician Assistant

## 2021-10-10 VITALS — BP 162/79 | HR 76 | Temp 98.2°F

## 2021-10-10 DIAGNOSIS — U071 COVID-19: Secondary | ICD-10-CM | POA: Diagnosis not present

## 2021-10-10 DIAGNOSIS — J329 Chronic sinusitis, unspecified: Secondary | ICD-10-CM | POA: Diagnosis not present

## 2021-10-10 DIAGNOSIS — J40 Bronchitis, not specified as acute or chronic: Secondary | ICD-10-CM

## 2021-10-10 MED ORDER — AZITHROMYCIN 250 MG PO TABS: ORAL_TABLET | ORAL | 0 refills | Status: DC

## 2021-10-10 MED ORDER — BENZONATATE 200 MG PO CAPS: 200.0000 mg | ORAL_CAPSULE | Freq: Three times a day (TID) | ORAL | 0 refills | Status: DC | PRN

## 2021-10-10 MED ORDER — DEXAMETHASONE 4 MG PO TABS: 4.0000 mg | ORAL_TABLET | Freq: Two times a day (BID) | ORAL | 0 refills | Status: DC

## 2021-10-10 NOTE — Progress Notes (Signed)
..Virtual Visit via Telephone Note  I connected with Andres Stevens on [birth year] at  1:20 PM EST by telephone and verified that I am speaking with the correct person using two identifiers.  Location: Patient: assisted living Provider: clinic  .Marland KitchenParticipating in visit:  Patient: Andres Stevens Patient caregiver Provider: Iran Planas PA-C Provider in training: Judithann Sheen PA-C   I discussed the limitations, risks, security and privacy concerns of performing an evaluation and management service by telephone and the availability of in person appointments. I also discussed with the patient that there may be a patient responsible charge related to this service. The patient expressed understanding and agreed to proceed.   History of Present Illness: Pt is a 76 yo male Vascular Dementia, Bipolar disorder who tested positive for covid on 12/28 and treated with paxlovid. He continues to cough and have sinus drainage and sinus congestion. He denies any fever, chills, body aches. He is sleeping at night. Most of his discharge is clear but he feels like he cannot "cough" up enough stuff.   Active Ambulatory Problems    Diagnosis Date Noted   Hypogonadism male 07/13/2009   Bipolar disorder ([place]) 05/26/2009   BPH associated with nocturia 05/26/2009   ERECTILE DYSFUNCTION, ORGANIC 05/26/2009   PEYRONIE'S DISEASE 05/26/2009   Vascular dementia without behavioral disturbance ([place]) 05/13/2012   Thyroid cyst 10/15/2012   B12 deficiency 12/24/2013   History of basal cell carcinoma 10/22/2014   History of stroke 10/08/2015   Family history of dementia 04/16/2016   CKD (chronic kidney disease) stage 3, GFR 30-59 ml/min (HCC) 10/24/2016   Primary osteoarthritis of right knee 02/01/2017   Degenerative disc disease, lumbar 06/27/2017   Acute pain of left foot 03/12/2018   History of exposure to asbestos 05/19/2019   Chronic midline low back pain without sciatica 08/19/2019   DDD (degenerative disc  disease), cervical 08/19/2019   Gout 02/17/2020   Fluctuating blood pressure 08/26/2020   Elevated urine levels of catecholamines 08/31/2020   Renal atrophy 08/31/2020   Wears glasses 01/13/2021   Enterococcus UTI 09/14/2020   GERD (gastroesophageal reflux disease) 06/23/2021   Resolved Ambulatory Problems    Diagnosis Date Noted   FLANK PAIN 06/24/2009   Eye swelling, right 04/03/2016   Impaired cognition 04/16/2016   Confusion 04/16/2016   Pain and swelling of toe, right with mild hyperuricemia 01/29/2019   SOB (shortness of breath) 02/14/2019   Hypoglycemia 08/31/2020   Coughing 01/05/2021   Past Medical History:  Diagnosis Date   Bipolar 1 disorder ([place])    Wears hearing aid        Observations/Objective: Coughing so much hard to finish sentence No labored breathing  .Marland Kitchen Today's Vitals   10/10/21 1315  BP: (!) 162/79  Pulse: 76  Temp: 98.2 F (36.8 C)   There is no height or weight on file to calculate BMI.    Assessment and Plan: Marland KitchenMarland KitchenSantana was seen today for covid positive.  Diagnoses and all orders for this visit:  COVID-19 virus infection -     benzonatate (TESSALON) 200 MG capsule; Take 1 capsule (200 mg total) by mouth 3 (three) times daily as needed for cough. -     azithromycin (ZITHROMAX Z-PAK) 250 MG tablet; Take 2 tablets (500 mg) on  Day 1,  followed by 1 tablet (250 mg) once daily on Days 2 through 5. -     dexamethasone (DECADRON) 4 MG tablet; Take 1 tablet (4 mg total) by mouth 2 (two) times daily with  a meal.  Sinobronchitis -     benzonatate (TESSALON) 200 MG capsule; Take 1 capsule (200 mg total) by mouth 3 (three) times daily as needed for cough. -     azithromycin (ZITHROMAX Z-PAK) 250 MG tablet; Take 2 tablets (500 mg) on  Day 1,  followed by 1 tablet (250 mg) once daily on Days 2 through 5. -     dexamethasone (DECADRON) 4 MG tablet; Take 1 tablet (4 mg total) by mouth 2 (two) times daily with a meal.   Not improving after covid  infection Start dexamethazone, zpak, tessalon pearls Keep deep breathing Call with any worsening SOB or leg pain or swelling  Follow Up Instructions:    I discussed the assessment and treatment plan with the patient. The patient was provided an opportunity to ask questions and all were answered. The patient agreed with the plan and demonstrated an understanding of the instructions.   The patient was advised to call back or seek an in-person evaluation if the symptoms worsen or if the condition fails to improve as anticipated.  I provided 20 minutes of non-face-to-face time during this encounter.   Iran Planas, PA-C

## 2021-10-10 NOTE — Telephone Encounter (Signed)
Virtual has been scheduled with Luvenia Starch this afternoon/ AM

## 2021-10-11 ENCOUNTER — Telehealth: Payer: Medicare Other | Admitting: Physician Assistant

## 2021-10-18 ENCOUNTER — Encounter: Payer: Self-pay | Admitting: Family Medicine

## 2021-10-18 ENCOUNTER — Other Ambulatory Visit: Payer: Self-pay

## 2021-10-18 ENCOUNTER — Ambulatory Visit (INDEPENDENT_AMBULATORY_CARE_PROVIDER_SITE_OTHER): Payer: Medicare Other

## 2021-10-18 DIAGNOSIS — R059 Cough, unspecified: Secondary | ICD-10-CM | POA: Diagnosis not present

## 2021-10-18 DIAGNOSIS — R053 Chronic cough: Secondary | ICD-10-CM | POA: Diagnosis not present

## 2021-10-18 DIAGNOSIS — U099 Post covid-19 condition, unspecified: Secondary | ICD-10-CM

## 2021-10-18 NOTE — Telephone Encounter (Signed)
Ordered chest x-ray. Wife is aware and will bring him today.

## 2021-10-18 NOTE — Telephone Encounter (Signed)
[place] for CXR.

## 2021-10-21 NOTE — Progress Notes (Signed)
CXR normal. No acute findings. This appears to be post viral cough. Are the tessalon pearls helping. Is the cough worse at night?

## 2021-10-21 NOTE — Telephone Encounter (Signed)
Increase protonix to 40mg  twice a day and see if that helps the cough any before breakfast and then again before dinner.

## 2021-11-17 ENCOUNTER — Ambulatory Visit: Payer: Medicare Other | Admitting: Podiatry

## 2021-11-22 DIAGNOSIS — I69311 Memory deficit following cerebral infarction: Secondary | ICD-10-CM | POA: Diagnosis not present

## 2021-11-22 DIAGNOSIS — F329 Major depressive disorder, single episode, unspecified: Secondary | ICD-10-CM | POA: Diagnosis not present

## 2021-11-22 DIAGNOSIS — F320 Major depressive disorder, single episode, mild: Secondary | ICD-10-CM | POA: Diagnosis not present

## 2021-11-22 DIAGNOSIS — Z79899 Other long term (current) drug therapy: Secondary | ICD-10-CM | POA: Diagnosis not present

## 2021-11-22 DIAGNOSIS — F01B4 Vascular dementia, moderate, with anxiety: Secondary | ICD-10-CM | POA: Diagnosis not present

## 2021-11-23 ENCOUNTER — Other Ambulatory Visit: Payer: Self-pay

## 2021-11-23 ENCOUNTER — Encounter: Payer: Self-pay | Admitting: Family Medicine

## 2021-11-23 ENCOUNTER — Ambulatory Visit (INDEPENDENT_AMBULATORY_CARE_PROVIDER_SITE_OTHER): Payer: Medicare Other | Admitting: Family Medicine

## 2021-11-23 VITALS — BP 124/58 | HR 69 | Resp 18 | Ht 68.0 in | Wt 165.0 lb

## 2021-11-23 DIAGNOSIS — M545 Low back pain, unspecified: Secondary | ICD-10-CM

## 2021-11-23 DIAGNOSIS — R82998 Other abnormal findings in urine: Secondary | ICD-10-CM

## 2021-11-23 DIAGNOSIS — R053 Chronic cough: Secondary | ICD-10-CM

## 2021-11-23 DIAGNOSIS — M5136 Other intervertebral disc degeneration, lumbar region: Secondary | ICD-10-CM

## 2021-11-23 DIAGNOSIS — N1831 Chronic kidney disease, stage 3a: Secondary | ICD-10-CM | POA: Diagnosis not present

## 2021-11-23 LAB — BASIC METABOLIC PANEL WITH GFR
BUN: 16 mg/dL (ref 7–25)
CO2: 30 mmol/L (ref 20–32)
Calcium: 9.9 mg/dL (ref 8.6–10.3)
Chloride: 104 mmol/L (ref 98–110)
Creat: 1.24 mg/dL (ref 0.70–1.28)
Glucose, Bld: 143 mg/dL — ABNORMAL HIGH (ref 65–99)
Potassium: 4.3 mmol/L (ref 3.5–5.3)
Sodium: 143 mmol/L (ref 135–146)
eGFR: 60 mL/min/{1.73_m2} (ref >=60)

## 2021-11-23 LAB — POCT URINALYSIS DIP (CLINITEK)
Bilirubin, UA: NEGATIVE
Blood, UA: NEGATIVE
Glucose, UA: NEGATIVE mg/dL
Ketones, POC UA: NEGATIVE mg/dL
Leukocytes, UA: NEGATIVE
Nitrite, UA: NEGATIVE
POC PROTEIN,UA: NEGATIVE
Spec Grav, UA: >=1.03 — AB (ref 1.010–1.025)
Urobilinogen, UA: 0.2 E.U./dL
pH, UA: 5.5 (ref 5.0–8.0)

## 2021-11-23 NOTE — Progress Notes (Signed)
Acute Office Visit  Subjective:    Patient ID: Andres Stevens, male    DOB: 1946/07/17, 76 y.o.   MRN: [id number]  Chief Complaint  Patient presents with   Urinary Tract Infection    Dark urine and low back pain     HPI Patient is brought in today by his wife who reports that the aide at the nursing home says that he has been experiencing low back pain and dark-colored urine.  History of CKD 3 and renal atrophy. He had a recent renal function in December when he went to the emergency department on December 1 for a severe headache..  Serum creatinine was 1.36.  That was up from his previous in September at 1.0.  Did see the geriatric psychiatrist yesterday and they increased the Lexapro to 20 mg.  He says he is here today because he is worried about his cough.  His wife says some days he does not cough at all and then other days he is coughing frequently.  He does have a history of GERD and we had taken him off of his PPI sometime ago.  No deep cough it feels like it is more of a throat clearing.  No shortness of breath.  No fevers or chills.  And again it has been more chronic.  Past Medical History:  Diagnosis Date   Bipolar 1 disorder ([place])    Wears hearing aid     Past Surgical History:  Procedure Laterality Date   CERVICAL FUSION     PARATHYROIDECTOMY  03/2016    Family History  Problem Relation Age of Onset   Depression Father     Social History   Socioeconomic History   Marital status: Married    Spouse name: Not on file   Number of children: Not on file   Years of education: Not on file   Highest education level: Not on file  Occupational History   Not on file  Tobacco Use   Smoking status: Former    Packs/day: 0.25    Types: Cigarettes    Quit date: 06/26/2015    Years since quitting: 6.4   Smokeless tobacco: Never  Substance and Sexual Activity   Alcohol use: Yes    Alcohol/week: 0.0 standard drinks   Drug use: No   Sexual activity: Not on file   Other Topics Concern   Not on file  Social History Narrative   Not on file   Social Determinants of Health   Financial Resource Strain: Not on file  Food Insecurity: Not on file  Transportation Needs: Not on file  Physical Activity: Not on file  Stress: Not on file  Social Connections: Not on file  Intimate Partner Violence: Not on file    Outpatient Medications Prior to Visit  Medication Sig Dispense Refill   acetaminophen (TYLENOL) 650 MG CR tablet Take 2 tablets (1,300 mg total) by mouth every 8 (eight) hours as needed for pain. 90 tablet 3   allopurinol (ZYLOPRIM) 300 MG tablet TAKE 1 TABLET BY MOUTH ONCE DAILY. 90 tablet 0   atorvastatin (LIPITOR) 40 MG tablet TAKE 1 TABLET BY MOUTH AT BEDTIME. 90 tablet 3   B Complex-C CAPS Take 1 capsule by mouth daily.     cetirizine (ZYRTEC) 10 MG tablet TAKE 1 TABLET BY MOUTH ONCE DAILY. 90 tablet 1   Cholecalciferol 125 MCG (5000 UT) capsule Take 10,000 Units by mouth daily.     diazoxide (PROGLYCEM) 50 MG/ML suspension Take 2  mLs (100 mg total) by mouth every 12 (twelve) hours. 60 mL 11   doxazosin (CARDURA) 1 MG tablet TAKE 1 TABLET BY MOUTH AT BEDTIME. 90 tablet 1   doxazosin (CARDURA) 2 MG tablet Take by mouth.     escitalopram (LEXAPRO) 20 MG tablet Take 20 mg by mouth daily.     glucose blood (RELION TRUE METRIX TEST STRIPS) test strip 1 each by Other route daily. Use as instructed 100 each 3   ketoconazole (NIZORAL) 2 % cream Apply 1 application topically daily. 60 g 0   lamoTRIgine (LAMICTAL) 100 MG tablet Take 100 mg by mouth at bedtime.     LORazepam (ATIVAN) 0.5 MG tablet      memantine (NAMENDA) 5 MG tablet Take 5 mg by mouth in the morning and at bedtime.     pantoprazole (PROTONIX) 40 MG tablet Take 1 tablet (40 mg total) by mouth in the morning. 90 tablet 1   QUEtiapine (SEROQUEL) 25 MG tablet Take by mouth.     ReliOn Lancet Devices 30G MISC 1 Device by Does not apply route daily. 100 each 3   CETIRIZINE HCL PO Take 1  tablet by mouth daily.     Compression Bandages MISC Below the knee compression stockings.  Pressure 20 mmHg to be applied in AM and removed before bedtime daily. 2 Units PRN   dexamethasone (DECADRON) 4 MG tablet Take 1 tablet (4 mg total) by mouth 2 (two) times daily with a meal. 10 tablet 0   azithromycin (ZITHROMAX Z-PAK) 250 MG tablet Take 2 tablets (500 mg) on  Day 1,  followed by 1 tablet (250 mg) once daily on Days 2 through 5. 6 tablet 0   benzonatate (TESSALON) 200 MG capsule Take 1 capsule (200 mg total) by mouth 3 (three) times daily as needed for cough. 30 capsule 0   escitalopram (LEXAPRO) 10 MG tablet Take 1 tablet by mouth daily.     No facility-administered medications prior to visit.    Allergies  Allergen Reactions   Bee Venom Anaphylaxis    Hx of local rxn     Review of Systems     Objective:    Physical Exam Constitutional:      Appearance: He is well-developed.  HENT:     Head: Normocephalic and atraumatic.  Eyes:     Conjunctiva/sclera: Conjunctivae normal.  Cardiovascular:     Rate and Rhythm: Normal rate and regular rhythm.     Heart sounds: Normal heart sounds.  Pulmonary:     Effort: Pulmonary effort is normal.     Breath sounds: Normal breath sounds.  Musculoskeletal:     Comments: No CVA tenderness.   Skin:    General: Skin is warm and dry.  Neurological:     Mental Status: He is alert and oriented to person, place, and time.  Psychiatric:        Behavior: Behavior normal.    BP (!) 124/58    Pulse 69    Resp 18    Ht _0  (1.727 m)    Wt 165 lb (74.8 kg)    SpO2 99%    BMI 25.09 kg/m  Wt Readings from Last 3 Encounters:  11/23/21 165 lb (74.8 kg)  07/27/21 177 lb (80.3 kg)  06/23/21 171 lb (77.6 kg)    There are no preventive care reminders to display for this patient.   There are no preventive care reminders to display for this patient.   Lab Results  Component Value Date   TSH 1.69 06/23/2021   Lab Results  Component  Value Date   WBC 5.5 06/23/2021   HGB 13.6 06/23/2021   HCT 39.0 06/23/2021   MCV 90.7 06/23/2021   PLT 155 06/23/2021   Lab Results  Component Value Date   NA 140 06/23/2021   K 4.0 06/23/2021   CO2 28 06/23/2021   GLUCOSE 95 06/23/2021   BUN 20 06/23/2021   CREATININE 1.09 06/23/2021   BILITOT 0.6 06/23/2021   ALKPHOS 78 01/19/2021   AST 15 06/23/2021   ALT 11 06/23/2021   PROT 6.6 06/23/2021   ALBUMIN 4.4 01/19/2021   CALCIUM 9.4 06/23/2021   EGFR 70 06/23/2021   GFR 58.58 (L) 01/19/2021   Lab Results  Component Value Date   CHOL 110 08/19/2019   Lab Results  Component Value Date   HDL 53 08/19/2019   Lab Results  Component Value Date   LDLCALC 23 08/19/2019   Lab Results  Component Value Date   TRIG 289 (H) 08/19/2019   Lab Results  Component Value Date   CHOLHDL 2.1 08/19/2019   Lab Results  Component Value Date   HGBA1C 4.6 02/17/2020       Assessment & Plan:   Problem List Items Addressed This Visit       Musculoskeletal and Integument   Degenerative disc disease, lumbar    Has chronic low back pain but is not complaining of any discomfort today.  He has seen Dr. Darene Lamer for this in the past and at one point did physical therapy but with his dementia progressing I really do not know that PT again would be helpful at this point in time.  He says the aides do some exercises with him routinely and that actually been helpful.        Genitourinary   CKD (chronic kidney disease) stage 3, GFR 30-59 ml/min (HCC)    Did have a bump in his serum creatinine back in December so we will plan to recheck that today.  Baseline is around 1.0.  No proteinuria in the urine.      Relevant Orders   BASIC METABOLIC PANEL WITH GFR     Other   Chronic cough    we discussed retrying his PPI for 4 weeks to see if this is helpful.  With GERD being one of the most common causes for chronic cough.  His chest is completely clear on exam today also consider postnasal drip.       Other Visit Diagnoses     Dark urine    -  Primary   Relevant Orders   POCT URINALYSIS DIP (CLINITEK) (Completed)   Acute midline low back pain without sciatica          Dark urine-urinalysis actually looks great today no concerning findings reassured him and his wife.  No prior history of kidney stones that could be causing some intermittent bleeding.   No orders of the defined types were placed in this encounter.    Beatrice Lecher, MD

## 2021-11-23 NOTE — Assessment & Plan Note (Signed)
Did have a bump in his serum creatinine back in December so we will plan to recheck that today.  Baseline is around 1.0.  No proteinuria in the urine. ?

## 2021-11-23 NOTE — Assessment & Plan Note (Signed)
we discussed retrying his PPI for 4 weeks to see if this is helpful.  With GERD being one of the most common causes for chronic cough.  His chest is completely clear on exam today also consider postnasal drip. ?

## 2021-11-23 NOTE — Assessment & Plan Note (Signed)
Has chronic low back pain but is not complaining of any discomfort today.  He has seen Dr. Darene Lamer for this in the past and at one point did physical therapy but with his dementia progressing I really do not know that PT again would be helpful at this point in time.  He says the aides do some exercises with him routinely and that actually been helpful. ?

## 2021-11-24 NOTE — Progress Notes (Signed)
Your lab work is within acceptable range and there are no concerning findings.   ?

## 2021-11-26 ENCOUNTER — Encounter: Payer: Self-pay | Admitting: Family Medicine

## 2021-12-21 ENCOUNTER — Ambulatory Visit (INDEPENDENT_AMBULATORY_CARE_PROVIDER_SITE_OTHER): Payer: Medicare Other | Admitting: Family Medicine

## 2021-12-21 ENCOUNTER — Encounter: Payer: Self-pay | Admitting: Family Medicine

## 2021-12-21 ENCOUNTER — Other Ambulatory Visit: Payer: Self-pay

## 2021-12-21 DIAGNOSIS — M1A09X0 Idiopathic chronic gout, multiple sites, without tophus (tophi): Secondary | ICD-10-CM

## 2021-12-21 DIAGNOSIS — N1831 Chronic kidney disease, stage 3a: Secondary | ICD-10-CM

## 2021-12-21 DIAGNOSIS — F0150 Vascular dementia without behavioral disturbance: Secondary | ICD-10-CM | POA: Diagnosis not present

## 2021-12-21 DIAGNOSIS — Z8673 Personal history of transient ischemic attack (TIA), and cerebral infarction without residual deficits: Secondary | ICD-10-CM | POA: Diagnosis not present

## 2021-12-21 DIAGNOSIS — Z23 Encounter for immunization: Secondary | ICD-10-CM

## 2021-12-21 DIAGNOSIS — Z818 Family history of other mental and behavioral disorders: Secondary | ICD-10-CM | POA: Diagnosis not present

## 2021-12-21 DIAGNOSIS — F3170 Bipolar disorder, currently in remission, most recent episode unspecified: Secondary | ICD-10-CM

## 2021-12-21 NOTE — Assessment & Plan Note (Signed)
Lan to recheck renal function. ?

## 2021-12-21 NOTE — Assessment & Plan Note (Signed)
Symptoms currently well controlled.  We will check uric acid level if at goal consider cutting back on the allopurinol dose. ?

## 2021-12-21 NOTE — Assessment & Plan Note (Addendum)
Stable on Namenda currently.  Again follows with geriatric psychiatry. ?

## 2021-12-21 NOTE — Progress Notes (Signed)
? ?Acute Office Visit ? ?Subjective:  ? ? Patient ID: Andres Stevens, male    DOB: [birth year], 76 y.o.   MRN: [id number] ? ?Chief Complaint  ?Patient presents with  ? Follow-up  ? ? ?HPI ?Patient is in today for 6 month follow up -he lives in a nursing facility.  He is here today with his wife who brings him in.  He reports that overall he is doing well.  Here today for 63-monthfollow-up. ? ?Gout-denies any recent flares or exacerbations currently on allopurinol 300 mg daily. ? ?Follow-up CKD 3-plan to recheck renal function. ? ?Past Medical History:  ?Diagnosis Date  ? [place] 1 [place]   ? Wears hearing aid   ? ? ?Past Surgical History:  ?Procedure Laterality Date  ? CERVICAL FUSION    ? PARATHYROIDECTOMY  03/2016  ? ? ?Family History  ?Problem Relation Age of Onset  ? Depression Father   ? ? ?Social History  ? ?Socioeconomic History  ? Marital status: Married  ?  Spouse name: Not on file  ? Number of children: Not on file  ? Years of education: Not on file  ? Highest education level: Not on file  ?Occupational History  ? Not on file  ?Tobacco Use  ? Smoking status: Former  ?  Packs/day: 0.25  ?  Types: Cigarettes  ?  Quit date: 06/26/2015  ?  Years since quitting: 6.4  ? Smokeless tobacco: Never  ?Substance and Sexual Activity  ? Alcohol use: Yes  ?  Alcohol/week: 0.0 standard drinks  ? Drug use: No  ? Sexual activity: Not on file  ?Other Topics Concern  ? Not on file  ?Social History Narrative  ? Not on file  ? ?Social Determinants of Health  ? ?Financial Resource Strain: Not on file  ?Food Insecurity: Not on file  ?Transportation Needs: Not on file  ?Physical Activity: Not on file  ?Stress: Not on file  ?Social Connections: Not on file  ?Intimate Partner Violence: Not on file  ? ? ?Outpatient Medications Prior to Visit  ?Medication Sig Dispense Refill  ? acetaminophen (TYLENOL) 650 MG CR tablet Take 2 tablets (1,300 mg total) by mouth every 8 (eight) hours as needed for pain. 90 tablet 3  ? allopurinol  (ZYLOPRIM) 300 MG tablet TAKE 1 TABLET BY MOUTH ONCE DAILY. 90 tablet 0  ? atorvastatin (LIPITOR) 40 MG tablet TAKE 1 TABLET BY MOUTH AT BEDTIME. 90 tablet 3  ? B Complex-C CAPS Take 1 capsule by mouth daily.    ? cetirizine (ZYRTEC) 10 MG tablet TAKE 1 TABLET BY MOUTH ONCE DAILY. 90 tablet 1  ? Cholecalciferol 125 MCG (5000 UT) capsule Take 10,000 Units by mouth daily.    ? diazoxide (PROGLYCEM) 50 MG/ML suspension Take 2 mLs (100 mg total) by mouth every 12 (twelve) hours. 60 mL 11  ? doxazosin (CARDURA) 1 MG tablet TAKE 1 TABLET BY MOUTH AT BEDTIME. 90 tablet 1  ? doxazosin (CARDURA) 2 MG tablet Take by mouth.    ? escitalopram (LEXAPRO) 20 MG tablet Take 20 mg by mouth daily.    ? glucose blood (RELION TRUE METRIX TEST STRIPS) test strip 1 each by Other route daily. Use as instructed 100 each 3  ? ketoconazole (NIZORAL) 2 % cream Apply 1 application topically daily. 60 g 0  ? lamoTRIgine (LAMICTAL) 100 MG tablet Take 100 mg by mouth at bedtime.    ? LORazepam (ATIVAN) 0.5 MG tablet     ? memantine (  NAMENDA) 5 MG tablet Take 5 mg by mouth in the morning and at bedtime.    ? pantoprazole (PROTONIX) 40 MG tablet Take 1 tablet (40 mg total) by mouth in the morning. 90 tablet 1  ? QUEtiapine (SEROQUEL) 25 MG tablet Take by mouth.    ? ReliOn Lancet Devices 30G MISC 1 Device by Does not apply route daily. 100 each 3  ? ?No facility-administered medications prior to visit.  ? ? ?Allergies  ?Allergen Reactions  ? Bee Venom Anaphylaxis  ?  Hx of local rxn ?  ? ? ?Review of Systems ? ?   ?Objective:  ?  ?Physical Exam ?Constitutional:   ?   Appearance: He is well-developed.  ?HENT:  ?   Head: Normocephalic and atraumatic.  ?Cardiovascular:  ?   Rate and Rhythm: Normal rate and regular rhythm.  ?   Heart sounds: Normal heart sounds.  ?Pulmonary:  ?   Effort: Pulmonary effort is normal.  ?   Breath sounds: Normal breath sounds.  ?Skin: ?   General: Skin is warm and dry.  ?Neurological:  ?   Mental Status: He is alert and  oriented to person, place, and time.  ?Psychiatric:     ?   Behavior: Behavior normal.  ? ? ?BP (!) 116/51   Pulse 66   Ht 5' 8"  (1.727 m)   Wt 164 lb (74.4 kg)   SpO2 99%   BMI 24.94 kg/m?  ?Wt Readings from Last 3 Encounters:  ?12/21/21 164 lb (74.4 kg)  ?11/23/21 165 lb (74.8 kg)  ?07/27/21 177 lb (80.3 kg)  ? ? ?Health Maintenance Due  ?Topic Date Due  ? COVID-19 Vaccine (4 - Booster for Pfizer series) 08/18/2020  ? ? ?There are no preventive care reminders to display for this patient. ? ? ?Lab Results  ?Component Value Date  ? TSH 1.69 06/23/2021  ? ?Lab Results  ?Component Value Date  ? WBC 5.5 06/23/2021  ? HGB 13.6 06/23/2021  ? HCT 39.0 06/23/2021  ? MCV 90.7 06/23/2021  ? PLT 155 06/23/2021  ? ?Lab Results  ?Component Value Date  ? NA 143 11/23/2021  ? K 4.3 11/23/2021  ? CO2 30 11/23/2021  ? GLUCOSE 143 (H) 11/23/2021  ? BUN 16 11/23/2021  ? CREATININE 1.24 11/23/2021  ? BILITOT 0.6 06/23/2021  ? ALKPHOS 78 01/19/2021  ? AST 15 06/23/2021  ? ALT 11 06/23/2021  ? PROT 6.6 06/23/2021  ? ALBUMIN 4.4 01/19/2021  ? CALCIUM 9.9 11/23/2021  ? EGFR 60 11/23/2021  ? GFR 58.58 (L) 01/19/2021  ? ?Lab Results  ?Component Value Date  ? CHOL 110 08/19/2019  ? ?Lab Results  ?Component Value Date  ? HDL 53 08/19/2019  ? ?Lab Results  ?Component Value Date  ? [place] 23 08/19/2019  ? ?Lab Results  ?Component Value Date  ? TRIG 289 (H) 08/19/2019  ? ?Lab Results  ?Component Value Date  ? CHOLHDL 2.1 08/19/2019  ? ?Lab Results  ?Component Value Date  ? HGBA1C 4.6 02/17/2020  ? ? ?   ?Assessment & Plan:  ? ?Problem List Items Addressed This Visit   ? ?  ? Nervous and Auditory  ? Vascular dementia without behavioral disturbance ([place])  ?  Stable on Namenda currently.  Again follows with geriatric psychiatry. ?  ?  ?  ? Genitourinary  ? CKD (chronic kidney disease) stage 3, GFR 30-59 ml/min (HCC)  ?  Lan to recheck renal function. ?  ?  ?  Relevant Orders  ? Lipid panel  ? Uric acid  ?  ? Other  ? History of stroke  ?  Due  to recheck lipid levels just to make sure that the statin is still controlling those numbers. ?  ?  ? Relevant Orders  ? Lipid panel  ? Uric acid  ? Gout  ?  Symptoms currently well controlled.  We will check uric acid level if at goal consider cutting back on the allopurinol dose. ?  ?  ? Relevant Orders  ? Uric acid  ? Family history of dementia  ? Bipolar disorder ([place])  ?  Lexapro was recently increased to 20 mg maybe a month ago for more low mood symptoms.  So he did screen positive on his PHQ-9 today but he is followed by geriatric psychiatrist. ?  ?  ? ?Other Visit Diagnoses   ? ? Need for pneumococcal vaccination      ? Relevant Orders  ? Pneumococcal conjugate vaccine 20-valent (Prevnar 20) (Completed)  ? ?  ? ?Prevnar 20 given today to update pneumonia vaccines based on new guideline recommendations. ? ?No orders of the defined types were placed in this encounter. ? ? ? ?Beatrice Lecher, MD ? ?

## 2021-12-21 NOTE — Assessment & Plan Note (Signed)
Lexapro was recently increased to 20 mg maybe a month ago for more low mood symptoms.  So he did screen positive on his PHQ-9 today but he is followed by geriatric psychiatrist. ?

## 2021-12-21 NOTE — Assessment & Plan Note (Signed)
Due to recheck lipid levels just to make sure that the statin is still controlling those numbers. ?

## 2022-01-11 ENCOUNTER — Other Ambulatory Visit: Payer: Self-pay

## 2022-01-11 DIAGNOSIS — K2100 Gastro-esophageal reflux disease with esophagitis, without bleeding: Secondary | ICD-10-CM

## 2022-01-11 MED ORDER — PANTOPRAZOLE SODIUM 40 MG PO TBEC: 40.0000 mg | DELAYED_RELEASE_TABLET | Freq: Every morning | ORAL | 1 refills | Status: DC

## 2022-01-19 DIAGNOSIS — H2513 Age-related nuclear cataract, bilateral: Secondary | ICD-10-CM | POA: Diagnosis not present

## 2022-01-19 DIAGNOSIS — H353131 Nonexudative age-related macular degeneration, bilateral, early dry stage: Secondary | ICD-10-CM | POA: Diagnosis not present

## 2022-01-19 DIAGNOSIS — H18413 Arcus senilis, bilateral: Secondary | ICD-10-CM | POA: Diagnosis not present

## 2022-01-19 DIAGNOSIS — H35033 Hypertensive retinopathy, bilateral: Secondary | ICD-10-CM | POA: Diagnosis not present

## 2022-01-19 DIAGNOSIS — H43813 Vitreous degeneration, bilateral: Secondary | ICD-10-CM | POA: Diagnosis not present

## 2022-01-26 ENCOUNTER — Encounter: Payer: Self-pay | Admitting: Family Medicine

## 2022-02-06 ENCOUNTER — Encounter: Payer: Self-pay | Admitting: Family Medicine

## 2022-02-06 MED ORDER — LORAZEPAM 0.5 MG PO TABS: 0.5000 mg | ORAL_TABLET | Freq: Every day | ORAL | 1 refills | Status: DC | PRN

## 2022-02-06 NOTE — Telephone Encounter (Signed)
Patient's wife Arbie Cookey was informed earlier this morning to check later on today with the preferred pharmacy for the rx. During the call, she voiced she was agreeable with this plan.  ?

## 2022-02-06 NOTE — Telephone Encounter (Signed)
Meds ordered this encounter  ?Medications  ? LORazepam (ATIVAN) 0.5 MG tablet  ?  Sig: Take 1 tablet (0.5 mg total) by mouth daily as needed for anxiety.  ?  Dispense:  90 tablet  ?  Refill:  1  ? ? ?

## 2022-02-09 ENCOUNTER — Ambulatory Visit: Payer: Medicare Other | Admitting: Podiatry

## 2022-02-23 ENCOUNTER — Ambulatory Visit (INDEPENDENT_AMBULATORY_CARE_PROVIDER_SITE_OTHER): Payer: Medicare Other | Admitting: Podiatry

## 2022-02-23 ENCOUNTER — Encounter: Payer: Self-pay | Admitting: Podiatry

## 2022-02-23 DIAGNOSIS — M79675 Pain in left toe(s): Secondary | ICD-10-CM | POA: Diagnosis not present

## 2022-02-23 DIAGNOSIS — M79674 Pain in right toe(s): Secondary | ICD-10-CM | POA: Diagnosis not present

## 2022-02-23 DIAGNOSIS — B351 Tinea unguium: Secondary | ICD-10-CM | POA: Diagnosis not present

## 2022-02-23 NOTE — Progress Notes (Signed)
Subjective: 75 y.o. returns the office today for painful, elongated, thickened toenails which he cannot trim himself. Denies any redness or drainage around the nails. Denies any acute changes since last appointment and no new complaints today. Denies any systemic complaints such as fevers, chills, nausea, vomiting.   PCP: Catherine Metheney MD   Objective: NAD DP/PT pulses palpable, CRT less than 3 seconds Nails hypertrophic, dystrophic, elongated, brittle, discolored 10. There is tenderness overlying the nails 1-5 bilaterally. There is no surrounding erythema or drainage along the nail sites. Tinea pedis has improved.  No open lesions or pre-ulcerative lesions are identified. No other areas of tenderness bilateral lower extremities. No overlying edema, erythema, increased warmth. No pain with calf compression, swelling, warmth, erythema.  Assessment: Patient presents with symptomatic onychomycosis.  Plan: -Treatment options including alternatives, risks, complications were discussed -Nails sharply debrided 10 without complication/bleeding. -Discussed daily foot inspection. If there are any changes, to call the office immediately.  -Follow-up in 3 months or sooner if any problems are to arise. In the meantime, encouraged to call the office with any questions, concerns, changes symptoms.  Natalin Bible, DPM  

## 2022-03-20 DIAGNOSIS — H43813 Vitreous degeneration, bilateral: Secondary | ICD-10-CM | POA: Diagnosis not present

## 2022-03-20 DIAGNOSIS — H1045 Other chronic allergic conjunctivitis: Secondary | ICD-10-CM | POA: Diagnosis not present

## 2022-03-20 DIAGNOSIS — H5213 Myopia, bilateral: Secondary | ICD-10-CM | POA: Diagnosis not present

## 2022-03-20 DIAGNOSIS — H2513 Age-related nuclear cataract, bilateral: Secondary | ICD-10-CM | POA: Diagnosis not present

## 2022-03-20 DIAGNOSIS — H04123 Dry eye syndrome of bilateral lacrimal glands: Secondary | ICD-10-CM | POA: Diagnosis not present

## 2022-03-31 ENCOUNTER — Other Ambulatory Visit: Payer: Self-pay | Admitting: Medical-Surgical

## 2022-04-08 ENCOUNTER — Other Ambulatory Visit: Payer: Self-pay | Admitting: Family Medicine

## 2022-04-12 ENCOUNTER — Other Ambulatory Visit: Payer: Self-pay

## 2022-04-12 MED ORDER — DIAZOXIDE 50 MG/ML PO SUSP: 100.0000 mg | Freq: Two times a day (BID) | ORAL | 4 refills | Status: DC

## 2022-04-12 NOTE — Telephone Encounter (Signed)
Pt's wife called and stated that this medication is managed by his Endocrinologist Dr. Kelton Pillar and she is going to call their office to have this refilled through them. She stated that she wasn't sure why it was sent to Dr. Madilyn Fireman. Will remove.

## 2022-05-09 ENCOUNTER — Other Ambulatory Visit: Payer: Self-pay | Admitting: Medical-Surgical

## 2022-05-17 ENCOUNTER — Encounter: Payer: Self-pay | Admitting: General Practice

## 2022-05-23 DIAGNOSIS — F01B4 Vascular dementia, moderate, with anxiety: Secondary | ICD-10-CM | POA: Diagnosis not present

## 2022-05-23 DIAGNOSIS — R32 Unspecified urinary incontinence: Secondary | ICD-10-CM | POA: Diagnosis not present

## 2022-05-23 DIAGNOSIS — F319 Bipolar disorder, unspecified: Secondary | ICD-10-CM | POA: Diagnosis not present

## 2022-05-23 DIAGNOSIS — R159 Full incontinence of feces: Secondary | ICD-10-CM | POA: Diagnosis not present

## 2022-05-23 DIAGNOSIS — Z79899 Other long term (current) drug therapy: Secondary | ICD-10-CM | POA: Diagnosis not present

## 2022-05-23 DIAGNOSIS — Z636 Dependent relative needing care at home: Secondary | ICD-10-CM | POA: Diagnosis not present

## 2022-06-01 ENCOUNTER — Ambulatory Visit (INDEPENDENT_AMBULATORY_CARE_PROVIDER_SITE_OTHER): Payer: Medicare Other | Admitting: Podiatry

## 2022-06-01 ENCOUNTER — Encounter: Payer: Self-pay | Admitting: Podiatry

## 2022-06-01 DIAGNOSIS — B351 Tinea unguium: Secondary | ICD-10-CM

## 2022-06-01 DIAGNOSIS — M79675 Pain in left toe(s): Secondary | ICD-10-CM | POA: Diagnosis not present

## 2022-06-01 DIAGNOSIS — M79674 Pain in right toe(s): Secondary | ICD-10-CM

## 2022-06-01 NOTE — Progress Notes (Signed)
Subjective: 76 y.o. returns the office today for painful, elongated, thickened toenails which he cannot trim himself. Denies any redness or drainage around the nails. Denies any acute changes since last appointment and no new complaints today. Denies any systemic complaints such as fevers, chills, nausea, vomiting.   PCP: Beatrice Lecher MD   Objective: NAD DP/PT pulses palpable, CRT less than 3 seconds Nails hypertrophic, dystrophic, elongated, brittle, discolored 10. There is tenderness overlying the nails 1-5 bilaterally. There is no surrounding erythema or drainage along the nail sites. Tinea pedis has improved.  No open lesions or pre-ulcerative lesions are identified. No other areas of tenderness bilateral lower extremities. No overlying edema, erythema, increased warmth. No pain with calf compression, swelling, warmth, erythema.  Assessment: Patient presents with symptomatic onychomycosis.  Plan: -Treatment options including alternatives, risks, complications were discussed -Nails sharply debrided 10 without complication/bleeding. -Discussed daily foot inspection. If there are any changes, to call the office immediately.  -Follow-up in 3 months or sooner if any problems are to arise. In the meantime, encouraged to call the office with any questions, concerns, changes symptoms.  Lorenda Peck, DPM

## 2022-06-12 ENCOUNTER — Other Ambulatory Visit: Payer: Self-pay | Admitting: Medical-Surgical

## 2022-06-12 ENCOUNTER — Other Ambulatory Visit: Payer: Self-pay | Admitting: Family Medicine

## 2022-06-12 DIAGNOSIS — M79674 Pain in right toe(s): Secondary | ICD-10-CM

## 2022-06-12 NOTE — Telephone Encounter (Signed)
Last filled 09/09/2021  Last office visit 12/21/2021  Upcoming appointment 06/28/2022

## 2022-06-22 LAB — LIPID PANEL
Cholesterol: 139 mg/dL (ref <200)
HDL: 43 mg/dL (ref >=40)
LDL Cholesterol (Calc): 68 mg/dL (calc)
Non-HDL Cholesterol (Calc): 96 mg/dL (calc) (ref <130)
Total CHOL/HDL Ratio: 3.2 (calc) (ref <5.0)
Triglycerides: 228 mg/dL — ABNORMAL HIGH (ref <150)

## 2022-06-22 LAB — URIC ACID: Uric Acid, Serum: 5 mg/dL (ref 4.0–8.0)

## 2022-06-22 NOTE — Progress Notes (Signed)
Hi Kurtiss, total cholesterol and LDL look great.  Glycerides are still elevated so still continue to work on [employer].  Uric acid is at goal.

## 2022-06-28 ENCOUNTER — Ambulatory Visit: Payer: Medicare Other | Admitting: Family Medicine

## 2022-07-09 ENCOUNTER — Other Ambulatory Visit: Payer: Self-pay | Admitting: Internal Medicine

## 2022-07-20 ENCOUNTER — Ambulatory Visit (INDEPENDENT_AMBULATORY_CARE_PROVIDER_SITE_OTHER): Payer: Medicare Other | Admitting: Family Medicine

## 2022-07-20 ENCOUNTER — Encounter: Payer: Self-pay | Admitting: Family Medicine

## 2022-07-20 VITALS — BP 138/70 | HR 63 | Ht 68.0 in | Wt 174.0 lb

## 2022-07-20 DIAGNOSIS — N1831 Chronic kidney disease, stage 3a: Secondary | ICD-10-CM

## 2022-07-20 DIAGNOSIS — F3170 Bipolar disorder, currently in remission, most recent episode unspecified: Secondary | ICD-10-CM

## 2022-07-20 DIAGNOSIS — R052 Subacute cough: Secondary | ICD-10-CM | POA: Diagnosis not present

## 2022-07-20 DIAGNOSIS — T17308A Unspecified foreign body in larynx causing other injury, initial encounter: Secondary | ICD-10-CM

## 2022-07-20 DIAGNOSIS — Z23 Encounter for immunization: Secondary | ICD-10-CM | POA: Diagnosis not present

## 2022-07-20 DIAGNOSIS — R053 Chronic cough: Secondary | ICD-10-CM

## 2022-07-20 NOTE — Assessment & Plan Note (Addendum)
He had a chronic cough on and off.  We discussed potential causes including GERD, postnasal drip and or aspirating.  He did try PPI for a period of time but it did not seem to be helpful.  Discontinue his PPI.  It sounds like it may be more triggered with eating so will refer to GI for further work-up for possible aspiration.

## 2022-07-20 NOTE — Patient Instructions (Signed)
Please discontinue pantoprazole.  Since it did not help the cough we will discontinue it.

## 2022-07-20 NOTE — Progress Notes (Signed)
Established Patient Office Visit  Subjective   Patient ID: Andres Stevens, male    DOB: October 06, 1945  Age: 76 y.o. MRN: [id number]  Chief Complaint  Patient presents with   Chronic Kidney Disease    HPI  F/U CKD 3 - due to recheck renal function.  He has no new symptoms.  Has a lingering cough on and off x 1 year. Seems worse after eats and when drinks with ice.  His wife was able to check with the nurse at the assisted living and notes that he does tend to cough more and choke more specifically with mealtime and eating.  He does not have any breathing issues or sore throat.  He has been on a PPI for months without any improvement or resolution of his symptoms.  No sinus symptoms.    ROS    Objective:     BP 138/70   Pulse 63   Ht '5\' 8"'$  (1.727 m)   Wt 174 lb (78.9 kg)   SpO2 99%   BMI 26.46 kg/m    Physical Exam Constitutional:      Appearance: He is well-developed.  HENT:     Head: Normocephalic and atraumatic.  Cardiovascular:     Rate and Rhythm: Normal rate and regular rhythm.     Heart sounds: Normal heart sounds.  Pulmonary:     Effort: Pulmonary effort is normal.     Breath sounds: Normal breath sounds.  Skin:    General: Skin is warm and dry.  Neurological:     Mental Status: He is alert and oriented to person, place, and time.  Psychiatric:        Behavior: Behavior normal.      No results found for any visits on 07/20/22.    The 10-year ASCVD risk score (Arnett DK, et al., 2019) is: 36%    Assessment & Plan:   Problem List Items Addressed This Visit       Genitourinary   CKD (chronic kidney disease) stage 3, GFR 30-59 ml/min (HCC) - Primary    Continue to recheck every 6 months.   Lab Results  Component Value Date   CREATININE 1.24 11/23/2021         Relevant Orders   BASIC METABOLIC PANEL WITH GFR     Other   Chronic cough    He had a chronic cough on and off.  We discussed potential causes including GERD, postnasal drip  and or aspirating.  He did try PPI for a period of time but it did not seem to be helpful.  Discontinue his PPI.  It sounds like it may be more triggered with eating so will refer to GI for further work-up for possible aspiration.      Relevant Orders   Ambulatory referral to Gastroenterology   Bipolar disorder [facility])    Aching over his medications at this point since he is no longer seeing psychiatry.  We did refill his benzodiazepine but discussed that we typically do not use these medications more chronically in patients who are over 65 as it increases risk for falls and dementia so were gena work on tapering this medication off.      Other Visit Diagnoses     Subacute cough       Need for immunization against influenza       Relevant Orders   Flu Vaccine QUAD High Dose(Fluad) (Completed)   Choking, initial encounter       Relevant Orders  Ambulatory referral to Gastroenterology        Return in about 6 months (around 01/19/2023) for recheck kidneys .    Beatrice Lecher, MD

## 2022-07-20 NOTE — Assessment & Plan Note (Signed)
Aching over his medications at this point since he is no longer seeing psychiatry.  We did refill his benzodiazepine but discussed that we typically do not use these medications more chronically in patients who are over 65 as it increases risk for falls and dementia so were gena work on tapering this medication off.

## 2022-07-20 NOTE — Assessment & Plan Note (Signed)
Continue to recheck every 6 months.   Lab Results  Component Value Date   CREATININE 1.24 11/23/2021

## 2022-07-21 ENCOUNTER — Other Ambulatory Visit: Payer: Self-pay

## 2022-07-21 DIAGNOSIS — N1831 Chronic kidney disease, stage 3a: Secondary | ICD-10-CM

## 2022-07-21 LAB — BASIC METABOLIC PANEL WITH GFR
BUN/Creatinine Ratio: 12 (calc) (ref 6–22)
BUN: 16 mg/dL (ref 7–25)
CO2: 31 mmol/L (ref 20–32)
Calcium: 9.4 mg/dL (ref 8.6–10.3)
Chloride: 106 mmol/L (ref 98–110)
Creat: 1.35 mg/dL — ABNORMAL HIGH (ref 0.70–1.28)
Glucose, Bld: 68 mg/dL (ref 65–99)
Potassium: 4.3 mmol/L (ref 3.5–5.3)
Sodium: 143 mmol/L (ref 135–146)
eGFR: 54 mL/min/{1.73_m2} — ABNORMAL LOW (ref >=60)

## 2022-07-21 NOTE — Progress Notes (Signed)
Please call Tony's wife Arbie Cookey and let her know that the kidney function was up just slightly.  His baseline is between 1.0 and 1.2 and it was actually 1.3 this time.  I just want to keep a little closer eye on it and plan to recheck it again in 2 to 3 months instead of waiting 6 months.

## 2022-07-28 ENCOUNTER — Other Ambulatory Visit: Payer: Self-pay | Admitting: Internal Medicine

## 2022-08-02 ENCOUNTER — Ambulatory Visit: Payer: Medicare Other | Admitting: Internal Medicine

## 2022-08-02 ENCOUNTER — Encounter: Payer: Self-pay | Admitting: Internal Medicine

## 2022-08-02 VITALS — BP 118/70 | HR 66 | Ht 68.0 in | Wt 173.0 lb

## 2022-08-02 DIAGNOSIS — E162 Hypoglycemia, unspecified: Secondary | ICD-10-CM

## 2022-08-02 MED ORDER — DIAZOXIDE 50 MG/ML PO SUSP: 100.0000 mg | Freq: Two times a day (BID) | ORAL | 3 refills | Status: DC

## 2022-08-02 NOTE — Patient Instructions (Addendum)
-   Diazoxide 100 mg ( 2 mL ) TWICE daily

## 2022-08-02 NOTE — Progress Notes (Signed)
Name: Andres Stevens  MRN/ DOB: [id number], 08-26-1946    Age/ Sex: 76 y.o., male     PCP: Hali Marry, MD   Reason for Endocrinology Evaluation: Elevated Catecholamine     Initial Endocrinology Clinic Visit: 08/30/2020    PATIENT IDENTIFIER: Andres Stevens is a 76 y.o., male with a past medical history of Dyslipidemia, bipolar disorder and history of hemorrhagic stroke, S/P parathyroidectomy and vascular dementia . He has followed with [facility] since 08/30/2020 for consultative assistance with management of his elevated catecholamine.      HISTORICAL SUMMARY:  During evaluation for elevated BP readings at home in 07/2120 he was noted to have elevated plasma norepinephrine at 1973 pg/mL, and normetanephrine at 513 pg/ml but with normal epinephrine and metanephrines. His abdominal CT on 08/09/2020 was unrevealing.    He has been on doxazosin  since hemorrhagic stroke ~ 2016 . IN the past few weeks wife noted labile BP's. With SBP as high as 200 mg/dL mainly during the evening and lower in the morning. His Doxazosin dose has been increased to 2 mg and BP has been more manageable .      HYPOGLYCEMIA HISTORY: Pt was [facility] to [facility] on 08/[facility]/2020 with a BG of 33 mg/dL during evaluation for altered mental status as well as hypotension. He was treated with IV dextrose through EMS BY his errival to the ED his Bg was 57 mg/dL.   He was treated medically for suspected insulinoma with diazoxide 09/04/2020 . CT pancreatic protocol negative for pancreatic mass . Of note the pt was also diagnosed with Enterococcus UTI and treated with Vancomycin   No biological children  SUBJECTIVE:    Today (56/10/5636):  Andres Stevens is here for a follow up on hypoglycemia and  elevated catecholamine.  He is accompanied by his wife today   He will be moving to an assisted living on 02/02/2021  Weigh has been fluctuating  He is not sure if he has palpitations  Has  been coughing while eating with dysphagia  Dementia/confusion is worsening in nature He denies dizziness, or excessive sweating    98-131 mg/dL    ENDOCRINE MEDICATIONS Diazoxide 100 mg BID      HISTORY:  Past Medical History:  Past Medical History:  Diagnosis Date   Bipolar 1 disorder ([place])    Wears hearing aid    Past Surgical History:  Past Surgical History:  Procedure Laterality Date   CERVICAL FUSION     PARATHYROIDECTOMY  03/2016   Social History:  reports that he quit smoking about 7 years ago. His smoking use included cigarettes. He smoked an average of .25 packs per day. He has never used smokeless tobacco. He reports current alcohol use. He reports that he does not use drugs. Family History:  Family History  Problem Relation Age of Onset   Depression Father      HOME MEDICATIONS: Allergies as of 08/02/2022       Reactions   Bee Venom Anaphylaxis   Hx of local rxn        Medication List        Accurate as of August 02, 2022  3:25 PM. If you have any questions, ask your nurse or doctor.          acetaminophen 650 MG CR tablet Commonly known as: TYLENOL Take 2 tablets (1,300 mg total) by mouth every 8 (eight) hours as needed for pain.   allopurinol 300 MG tablet Commonly known as:  ZYLOPRIM TAKE 1 TABLET BY MOUTH ONCE DAILY.   atorvastatin 40 MG tablet Commonly known as: LIPITOR TAKE 1 TABLET BY MOUTH AT BEDTIME.   B Complex-C Caps Take 1 capsule by mouth daily.   cetirizine 10 MG tablet Commonly known as: ZYRTEC TAKE 1 TABLET BY MOUTH ONCE DAILY.   Cholecalciferol 125 MCG (5000 UT) capsule Take 10,000 Units by mouth daily.   diazoxide 50 MG/ML suspension Commonly known as: PROGLYCEM Take 2 mLs (100 mg total) by mouth in the morning and at bedtime. What changed: See the new instructions. Changed by: Dorita Sciara, MD   doxazosin 2 MG tablet Commonly known as: CARDURA Take by mouth.   doxazosin 1 MG tablet Commonly  known as: CARDURA TAKE 1 TABLET BY MOUTH AT BEDTIME.   escitalopram 20 MG tablet Commonly known as: LEXAPRO Take 20 mg by mouth daily.   ketoconazole 2 % cream Commonly known as: NIZORAL Apply 1 application topically daily.   lamoTRIgine 100 MG tablet Commonly known as: LAMICTAL Take 100 mg by mouth at bedtime.   memantine 5 MG tablet Commonly known as: NAMENDA Take 5 mg by mouth in the morning and at bedtime.   QUEtiapine 25 MG tablet Commonly known as: SEROQUEL Take by mouth.   ReliOn Lancet Devices 30G Misc 1 Device by Does not apply route daily.   ReliOn True Metrix Test Strips test strip Generic drug: glucose blood 1 each by Other route daily. Use as instructed          OBJECTIVE:   PHYSICAL EXAM: VS: BP 118/70 (BP Location: Left Arm, Patient Position: Sitting, Cuff Size: Small)   Pulse 66   Ht _0  (1.727 m)   Wt 173 lb (78.5 kg)   SpO2 99%   BMI 26.30 kg/m    EXAM: General: Pt appears well and is in NAD  Lungs: Clear with good BS bilat with no rales, rhonchi, or wheezes  Heart: Auscultation: RRR.  Extremities:  BL LE: 1+  pretibial edema   Mental Status:  Mood and affect: No depression, anxiety, or agitation     DATA REVIEWED:   Latest Reference Range & Units 07/20/22 00:00  Sodium 135 - 146 mmol/L 143  Potassium 3.5 - 5.3 mmol/L 4.3  Chloride 98 - 110 mmol/L 106  CO2 20 - 32 mmol/L 31  Glucose 65 - 99 mg/dL 68  BUN 7 - 25 mg/dL 16  Creatinine 0.70 - 1.28 mg/dL 1.35 (H)  Calcium 8.6 - 10.3 mg/dL 9.4  BUN/Creatinine Ratio 6 - 22 (calc) 12  eGFR > OR = 60 mL/min/1.45m 54 (L)      09/01/2020   3:58 AM  Insulin 2.7 uU/mL  Proinsulin 8.7 uU/mL    No concomitante serum glucose   But has a finger stick of 100 at 2:30 Am      CT abdomen /Pelvis 09/09/2020  CT ABDOMEN: Normal pancreas. No evidence for pancreatic tumor. Lung bases demonstrate a small right pleural effusion with dependent right lower lobe subsegmental atelectasis.  Thick-walled esophagus suggesting esophagitis. Normal heart size. Normal liver, gallbladder, biliary tree, pancreas, spleen, and adrenal glands. Small kidneys with bilateral renal cortical cysts. Aortoiliac atherosclerosis without aneurysm. No ascites or adenopathy.   CT PELVIS: Fat-containing bilateral inguinal hernias. Normal bladder, prostate, and seminal vesicles. Nonspecific gaseous distention of stomach, small bowel, and colon. No pelvic mass, adenopathy, or fluid collection. Multilevel lumbar DDD.    IMPRESSION: No evidence for pancreatic tumor.  ASSESSMENT / PLAN / RECOMMENDATIONS:   1. Hypoglycemia :   - High clinical suspicion for insulinoma , unfortunately no biochemical tests to confirm this it during hospitalizations (08/2020) and trying to confirm this bio chemically will cause hardship on the patient due to  dementia.  -We have opted to treat him medically - Negative CT imaging does NOT exclude insulinoma as they tend to be small and require endoscopic ultrasound  -The wife had a question if the dioxide dose could be reduced, but I do not think this is a good idea, because his most recent BMP showed a BG reading of 68 mg/DL, if we reduce the dose of diazoxide he will probably have BG's in the 50s or less  Medication Continue Diazoxide 100 mg BID     F/U in 1 year   Signed electronically by: Mack Guise, MD  [facility] Endocrinology  [address] [place], [place] [address] Phone: [phone] FAX: [phone]      CC: Hali Marry, [address] [place] [address] Phone: [phone]  Fax: [phone]   Return to Endocrinology clinic as below: Future Appointments  Date Time Provider [place]  08/31/2022 11:00 AM Bronson Ing, DPM [facility] None  01/18/2023  1:20 PM Hali Marry, MD [facility] None  08/08/2023  1:00 PM Romario Tith, Melanie Crazier, MD  [facility] None

## 2022-08-07 ENCOUNTER — Other Ambulatory Visit: Payer: Self-pay | Admitting: Family Medicine

## 2022-08-21 ENCOUNTER — Telehealth: Payer: Self-pay

## 2022-08-21 NOTE — Telephone Encounter (Signed)
Letter received from [employer] that [employer] is not on Formulary. Temporary supply will be given to patient.

## 2022-08-21 NOTE — Telephone Encounter (Signed)
Suggested Alternative is Diazoxide.

## 2022-08-23 ENCOUNTER — Other Ambulatory Visit: Payer: Self-pay | Admitting: Internal Medicine

## 2022-08-23 MED ORDER — DIAZOXIDE 50 MG/ML PO SUSP: 100.0000 mg | Freq: Two times a day (BID) | ORAL | 3 refills | Status: DC

## 2022-08-31 ENCOUNTER — Ambulatory Visit: Payer: Medicare Other | Admitting: Podiatrist

## 2022-09-05 ENCOUNTER — Other Ambulatory Visit: Payer: Self-pay | Admitting: Family Medicine

## 2022-09-08 ENCOUNTER — Other Ambulatory Visit: Payer: Self-pay | Admitting: Internal Medicine

## 2022-09-26 ENCOUNTER — Other Ambulatory Visit: Payer: Self-pay | Admitting: Family Medicine

## 2022-09-26 MED ORDER — MEMANTINE HCL 10 MG PO TABS: 10.0000 mg | ORAL_TABLET | Freq: Two times a day (BID) | ORAL | 0 refills | Status: AC

## 2022-09-28 ENCOUNTER — Other Ambulatory Visit: Payer: Self-pay | Admitting: Medical-Surgical

## 2022-10-09 ENCOUNTER — Other Ambulatory Visit: Payer: Self-pay | Admitting: Family Medicine

## 2022-10-09 NOTE — Telephone Encounter (Signed)
Rx not listed in active med list.   Last OV: 07/20/22 Next OV: 01/18/23

## 2022-10-12 ENCOUNTER — Other Ambulatory Visit: Payer: Self-pay | Admitting: Neurology

## 2022-10-12 MED ORDER — CHOLECALCIFEROL 125 MCG (5000 UT) PO CAPS: 10000.0000 [IU] | ORAL_CAPSULE | Freq: Every day | ORAL | 3 refills | Status: DC

## 2022-10-12 MED ORDER — B COMPLEX-B12 PO TABS: 1.0000 | ORAL_TABLET | Freq: Every day | ORAL | 3 refills | Status: AC

## 2022-10-12 NOTE — Progress Notes (Signed)
[facility] called for refills on B complex and D vitamins. OTC dosage, but gets RX. Sent via escribe.

## 2022-10-31 ENCOUNTER — Other Ambulatory Visit: Payer: Self-pay | Admitting: Family Medicine

## 2022-11-01 NOTE — Telephone Encounter (Signed)
Rx written by historical provider. 

## 2022-11-20 ENCOUNTER — Other Ambulatory Visit: Payer: Self-pay | Admitting: Family Medicine

## 2022-11-21 ENCOUNTER — Other Ambulatory Visit: Payer: Self-pay | Admitting: Family Medicine

## 2022-11-22 ENCOUNTER — Other Ambulatory Visit: Payer: Self-pay | Admitting: Family Medicine

## 2022-11-22 DIAGNOSIS — M1711 Unilateral primary osteoarthritis, right knee: Secondary | ICD-10-CM

## 2022-12-04 ENCOUNTER — Other Ambulatory Visit: Payer: Self-pay | Admitting: Family Medicine

## 2022-12-05 ENCOUNTER — Other Ambulatory Visit: Payer: Self-pay | Admitting: Family Medicine

## 2022-12-06 ENCOUNTER — Telehealth: Payer: Self-pay | Admitting: Family Medicine

## 2022-12-06 MED ORDER — DOXAZOSIN MESYLATE 1 MG PO TABS: 1.0000 mg | ORAL_TABLET | Freq: Every day | ORAL | 3 refills | Status: DC

## 2022-12-06 NOTE — Telephone Encounter (Signed)
Vicente Males from [facility] called in reference to doxazosin (CARDURA) 2 MG tablet  stated it was denied reason he ordered to soon they would like a call back [phone] to verify

## 2022-12-06 NOTE — Telephone Encounter (Signed)
Called [facility] to advise that this prescription was sent.

## 2022-12-06 NOTE — Telephone Encounter (Signed)
Meds ordered this encounter  Medications   doxazosin (CARDURA) 1 MG tablet    Sig: Take 1 tablet (1 mg total) by mouth at bedtime.    Dispense:  90 tablet    Refill:  3

## 2022-12-19 ENCOUNTER — Ambulatory Visit: Payer: Medicare Other | Admitting: Sports Medicine

## 2022-12-19 ENCOUNTER — Ambulatory Visit (INDEPENDENT_AMBULATORY_CARE_PROVIDER_SITE_OTHER): Payer: Medicare Other | Admitting: Sports Medicine

## 2022-12-19 ENCOUNTER — Other Ambulatory Visit: Payer: Self-pay | Admitting: Sports Medicine

## 2022-12-19 DIAGNOSIS — M5136 Other intervertebral disc degeneration, lumbar region: Secondary | ICD-10-CM

## 2022-12-19 DIAGNOSIS — M5030 Other cervical disc degeneration, unspecified cervical region: Secondary | ICD-10-CM

## 2022-12-19 DIAGNOSIS — M51369 Other intervertebral disc degeneration, lumbar region without mention of lumbar back pain or lower extremity pain: Secondary | ICD-10-CM

## 2022-12-19 MED ORDER — PREDNISONE 50 MG PO TABS: ORAL_TABLET | ORAL | 0 refills | Status: DC

## 2022-12-19 NOTE — Progress Notes (Addendum)
    Procedures performed today:    None.  Independent interpretation of notes and tests performed by another provider:   None.  Brief History, Exam, Impression, and Recommendations:    DDD (degenerative disc disease), cervical Known cervical degenerative disc disease, generally responds well to conservative treatment including physical therapy. Historically a course of prednisone and physical therapy is enough to buff him "shiny" for several years. We last treated him in 2022, we will do another course of prednisone and home health physical therapy. Return to see me in 6 weeks as needed.  Degenerative disc disease, lumbar As above known multilevel lumbar DDD, typically a course of prednisone and some physical therapy every few years is enough, ordering this again with a 6-week follow-up. The weakness in his legs is likely secondary to the lumbar degenerative disc disease and physical therapy/home health physical therapy is medically necessary    ____________________________________________ Gwen Her. Dianah Field, M.D., ABFM., CAQSM., AME. Primary Care and Sports Medicine [facility]  Adjunct Professor of [place] [employer]  Risk manager

## 2022-12-19 NOTE — Assessment & Plan Note (Addendum)
As above known multilevel lumbar DDD, typically a course of prednisone and some physical therapy every few years is enough, ordering this again with a 6-week follow-up. The weakness in his legs is likely secondary to the lumbar degenerative disc disease and physical therapy/home health physical therapy is medically necessary

## 2022-12-19 NOTE — Assessment & Plan Note (Signed)
Known cervical degenerative disc disease, generally responds well to conservative treatment including physical therapy. Historically a course of prednisone and physical therapy is enough to buff him "shiny" for several years. We last treated him in 2022, we will do another course of prednisone and home health physical therapy. Return to see me in 6 weeks as needed.

## 2022-12-26 ENCOUNTER — Telehealth: Payer: Self-pay | Admitting: Sports Medicine

## 2022-12-26 NOTE — Telephone Encounter (Signed)
Received incoming voicemail from Nestor Ramp with [facility] requesting addendum to clinical notes 12/19/22 stating patients weakness and pain is tied to DDD in order to get insurance to pay for Home Health PT. Please advise.

## 2022-12-26 NOTE — Telephone Encounter (Signed)
Not a problem, I addended the notes

## 2022-12-27 NOTE — Telephone Encounter (Signed)
Updated clinical notes have been faxed to [facility] at [phone].

## 2023-01-12 ENCOUNTER — Ambulatory Visit: Payer: Medicare Other | Admitting: Podiatry

## 2023-01-18 ENCOUNTER — Ambulatory Visit: Payer: No Typology Code available for payment source | Admitting: Family Medicine

## 2023-01-19 ENCOUNTER — Ambulatory Visit: Payer: Medicare Other | Admitting: Podiatry

## 2023-01-19 ENCOUNTER — Encounter: Payer: Self-pay | Admitting: Podiatry

## 2023-01-19 DIAGNOSIS — M79675 Pain in left toe(s): Secondary | ICD-10-CM | POA: Diagnosis not present

## 2023-01-19 DIAGNOSIS — M79674 Pain in right toe(s): Secondary | ICD-10-CM

## 2023-01-19 DIAGNOSIS — B351 Tinea unguium: Secondary | ICD-10-CM

## 2023-01-19 NOTE — Progress Notes (Signed)
Subjective: 77 y.o. returns the office today for painful, elongated, thickened toenails which he cannot trim himself. Denies any redness or drainage around the nails. Denies any acute changes since last appointment and no new complaints today. Denies any systemic complaints such as fevers, chills, nausea, vomiting.   PCP: Catherine Metheney MD   Objective: NAD DP/PT pulses palpable, CRT less than 3 seconds Nails hypertrophic, dystrophic, elongated, brittle, discolored 10. There is tenderness overlying the nails 1-5 bilaterally. There is no surrounding erythema or drainage along the nail sites. Tinea pedis has improved.  No open lesions or pre-ulcerative lesions are identified. No other areas of tenderness bilateral lower extremities. No overlying edema, erythema, increased warmth. No pain with calf compression, swelling, warmth, erythema.  Assessment: Patient presents with symptomatic onychomycosis.  Plan: -Treatment options including alternatives, risks, complications were discussed -Nails sharply debrided 10 without complication/bleeding. -Discussed daily foot inspection. If there are any changes, to call the office immediately.  -Follow-up in 3 months or sooner if any problems are to arise. In the meantime, encouraged to call the office with any questions, concerns, changes symptoms.  Victorian Gunn, DPM  

## 2023-02-06 ENCOUNTER — Ambulatory Visit (INDEPENDENT_AMBULATORY_CARE_PROVIDER_SITE_OTHER): Payer: Medicare Other | Admitting: Family Medicine

## 2023-02-06 ENCOUNTER — Encounter: Payer: Self-pay | Admitting: Family Medicine

## 2023-02-06 VITALS — BP 118/68 | HR 73 | Ht 68.0 in | Wt 177.0 lb

## 2023-02-06 DIAGNOSIS — N1831 Chronic kidney disease, stage 3a: Secondary | ICD-10-CM | POA: Diagnosis not present

## 2023-02-06 DIAGNOSIS — F0150 Vascular dementia without behavioral disturbance: Secondary | ICD-10-CM

## 2023-02-06 DIAGNOSIS — F3170 Bipolar disorder, currently in remission, most recent episode unspecified: Secondary | ICD-10-CM

## 2023-02-06 DIAGNOSIS — M1A09X0 Idiopathic chronic gout, multiple sites, without tophus (tophi): Secondary | ICD-10-CM | POA: Diagnosis not present

## 2023-02-06 NOTE — Assessment & Plan Note (Signed)
No recent changes to his medication regimen.

## 2023-02-06 NOTE — Progress Notes (Signed)
   Established Patient Office Visit  Subjective   Patient ID: Andres Stevens, male    DOB: Jan 23, 1946  Age: 77 y.o. MRN: [id number]  Chief Complaint  Patient presents with   Follow-up         HPI F/U Gout - doing well. No recent flares.   F/U CKD 3 - NO changes  Dementia -he has to be prompted to start eating but then once he gets going he usually does well.  Been doing physical therapy for his back and his neck.  He thinks its helped some.    ROS    Objective:     BP 118/68   Pulse 73   Ht 5\' 8"  (1.727 m)   Wt 177 lb (80.3 kg)   SpO2 96%   BMI 26.91 kg/m    Physical Exam Constitutional:      Appearance: He is well-developed.  HENT:     Head: Normocephalic and atraumatic.  Cardiovascular:     Rate and Rhythm: Normal rate and regular rhythm.     Heart sounds: Normal heart sounds.  Pulmonary:     Effort: Pulmonary effort is normal.     Breath sounds: Normal breath sounds.  Abdominal:     Tenderness: There is abdominal tenderness.     Comments: Mild tenderness in the right upper and left upper quadrant and epigastric area.  Skin:    General: Skin is warm and dry.  Neurological:     Mental Status: He is alert and oriented to person, place, and time.  Psychiatric:        Behavior: Behavior normal.      No results found for any visits on 02/06/23.    The 10-year ASCVD risk score (Arnett DK, et al., 2019) is: 28.6%    Assessment & Plan:   Problem List Items Addressed This Visit       Nervous and Auditory   Vascular dementia without behavioral disturbance (HCC)   Relevant Orders   Lipid Panel w/reflex Direct LDL   COMPLETE METABOLIC PANEL WITH GFR   CBC   Urine Microalbumin w/creat. ratio     Genitourinary   CKD (chronic kidney disease) stage 3, GFR 30-59 ml/min (HCC) - Primary    Due tor recheck renal function.  Hx of renal atrophy      Relevant Orders   Lipid Panel w/reflex Direct LDL   COMPLETE METABOLIC PANEL WITH GFR   CBC    Urine Microalbumin w/creat. ratio     Other   Gout   Relevant Orders   Lipid Panel w/reflex Direct LDL   COMPLETE METABOLIC PANEL WITH GFR   CBC   Urine Microalbumin w/creat. ratio   Bipolar disorder (HCC)    No recent changes to his medication regimen.        Return in about 6 months (around 08/09/2023) for recheck kidneys and labs.  Nani Gasser, MD

## 2023-02-06 NOTE — Assessment & Plan Note (Signed)
Due tor recheck renal function.  Hx of renal atrophy

## 2023-02-07 ENCOUNTER — Other Ambulatory Visit: Payer: Self-pay

## 2023-02-07 DIAGNOSIS — R799 Abnormal finding of blood chemistry, unspecified: Secondary | ICD-10-CM

## 2023-02-07 LAB — CBC
HCT: 37.4 % — ABNORMAL LOW (ref 38.5–50.0)
Hemoglobin: 12.8 g/dL — ABNORMAL LOW (ref 13.2–17.1)
MCH: 30.5 pg (ref 27.0–33.0)
MCHC: 34.2 g/dL (ref 32.0–36.0)
MCV: 89 fL (ref 80.0–100.0)
MPV: 9.1 fL (ref 7.5–12.5)
Platelets: 139 10*3/uL — ABNORMAL LOW (ref 140–400)
RBC: 4.2 10*6/uL (ref 4.20–5.80)
RDW: 14.4 % (ref 11.0–15.0)
WBC: 5.4 10*3/uL (ref 3.8–10.8)

## 2023-02-07 LAB — MICROALBUMIN / CREATININE URINE RATIO
Creatinine, Urine: 155 mg/dL (ref 20–320)
Microalb, Ur: <0.2 mg/dL

## 2023-02-07 LAB — COMPLETE METABOLIC PANEL WITH GFR
AG Ratio: 2.3 (calc) (ref 1.0–2.5)
ALT: 24 U/L (ref 9–46)
AST: 20 U/L (ref 10–35)
Albumin: 4.6 g/dL (ref 3.6–5.1)
Alkaline phosphatase (APISO): 89 U/L (ref 35–144)
BUN: 17 mg/dL (ref 7–25)
CO2: 29 mmol/L (ref 20–32)
Calcium: 9.5 mg/dL (ref 8.6–10.3)
Chloride: 105 mmol/L (ref 98–110)
Creat: 1.23 mg/dL (ref 0.70–1.28)
Globulin: 2 g/dL (calc) (ref 1.9–3.7)
Glucose, Bld: 81 mg/dL (ref 65–99)
Potassium: 3.9 mmol/L (ref 3.5–5.3)
Sodium: 143 mmol/L (ref 135–146)
Total Bilirubin: 0.6 mg/dL (ref 0.2–1.2)
Total Protein: 6.6 g/dL (ref 6.1–8.1)
eGFR: 60 mL/min/{1.73_m2} (ref >=60)

## 2023-02-07 LAB — LIPID PANEL W/REFLEX DIRECT LDL
Cholesterol: 130 mg/dL (ref <200)
HDL: 38 mg/dL — ABNORMAL LOW (ref >=40)
LDL Cholesterol (Calc): 62 mg/dL (calc)
Non-HDL Cholesterol (Calc): 92 mg/dL (calc) (ref <130)
Total CHOL/HDL Ratio: 3.4 (calc) (ref <5.0)
Triglycerides: 257 mg/dL — ABNORMAL HIGH (ref <150)

## 2023-02-07 MED ORDER — ALCOHOL PREP 70 % PADS: MEDICATED_PAD | 0 refills | Status: DC

## 2023-02-07 NOTE — Progress Notes (Signed)
Please call his wife with results.  Triglycerides are little elevated.  But LDL looks good.  Hemoglobin is around 12.8 he has been around 12 in the low 13 range for the last 10 years.  Platelets dropped slightly.  So doing to keep an eye on this and plan to recheck that again in about 2 to 3 months.  [place] panel is normal and no concerning amounts of protein in the urine.    Also let see if we can call the nursing home to see if they have updated his tetanus or shingles vaccines.

## 2023-02-12 ENCOUNTER — Telehealth: Payer: Self-pay | Admitting: Family Medicine

## 2023-02-12 NOTE — Telephone Encounter (Signed)
A Dante Gang dropped off orders to be signed and faxed by Dr. Lyna Poser is in Dr. Lucienne Minks box.

## 2023-03-01 ENCOUNTER — Other Ambulatory Visit: Payer: Self-pay | Admitting: Medical-Surgical

## 2023-04-04 ENCOUNTER — Other Ambulatory Visit: Payer: Self-pay | Admitting: Family Medicine

## 2023-04-04 DIAGNOSIS — M1711 Unilateral primary osteoarthritis, right knee: Secondary | ICD-10-CM

## 2023-04-19 ENCOUNTER — Encounter: Payer: Self-pay | Admitting: Podiatry

## 2023-04-19 ENCOUNTER — Ambulatory Visit: Payer: Medicare Other | Admitting: Podiatry

## 2023-04-19 DIAGNOSIS — M79674 Pain in right toe(s): Secondary | ICD-10-CM

## 2023-04-19 DIAGNOSIS — M79675 Pain in left toe(s): Secondary | ICD-10-CM | POA: Diagnosis not present

## 2023-04-19 DIAGNOSIS — B351 Tinea unguium: Secondary | ICD-10-CM

## 2023-04-19 NOTE — Progress Notes (Signed)
Subjective: 77 y.o. returns the office today for painful, elongated, thickened toenails which he cannot trim himself. Denies any redness or drainage around the nails. Denies any acute changes since last appointment and no new complaints today. Denies any systemic complaints such as fevers, chills, nausea, vomiting.   PCP: Catherine Metheney MD   Objective: NAD DP/PT pulses palpable, CRT less than 3 seconds Nails hypertrophic, dystrophic, elongated, brittle, discolored 10. There is tenderness overlying the nails 1-5 bilaterally. There is no surrounding erythema or drainage along the nail sites. Tinea pedis has improved.  No open lesions or pre-ulcerative lesions are identified. No other areas of tenderness bilateral lower extremities. No overlying edema, erythema, increased warmth. No pain with calf compression, swelling, warmth, erythema.  Assessment: Patient presents with symptomatic onychomycosis.  Plan: -Treatment options including alternatives, risks, complications were discussed -Nails sharply debrided 10 without complication/bleeding. -Discussed daily foot inspection. If there are any changes, to call the office immediately.  -Follow-up in 3 months or sooner if any problems are to arise. In the meantime, encouraged to call the office with any questions, concerns, changes symptoms.  Rebecca Sikora, DPM  

## 2023-04-25 ENCOUNTER — Other Ambulatory Visit: Payer: Self-pay | Admitting: Internal Medicine

## 2023-05-11 ENCOUNTER — Other Ambulatory Visit: Payer: Self-pay | Admitting: Family Medicine

## 2023-05-30 ENCOUNTER — Other Ambulatory Visit: Payer: Self-pay | Admitting: Family Medicine

## 2023-05-30 ENCOUNTER — Telehealth: Payer: Self-pay | Admitting: Family Medicine

## 2023-05-30 DIAGNOSIS — M79674 Pain in right toe(s): Secondary | ICD-10-CM

## 2023-05-30 NOTE — Telephone Encounter (Signed)
Racheal from [facility] called in stating that the patient facility is wanting to refill LORazepam (ATIVAN) 0.5 MG tablets for the patient but it has not been filled since Jan. She wants to know if it needs to be discontinued or can it be refilled. Pleas Advise  [facility] [phone]

## 2023-05-31 ENCOUNTER — Other Ambulatory Visit: Payer: Self-pay | Admitting: Family Medicine

## 2023-05-31 NOTE — Telephone Encounter (Signed)
Sent to pcp for review.

## 2023-05-31 NOTE — Telephone Encounter (Signed)
Sent to pcp for signature

## 2023-06-06 ENCOUNTER — Ambulatory Visit (INDEPENDENT_AMBULATORY_CARE_PROVIDER_SITE_OTHER): Payer: Medicare Other | Admitting: Family Medicine

## 2023-06-06 ENCOUNTER — Encounter: Payer: Self-pay | Admitting: Family Medicine

## 2023-06-06 VITALS — BP 120/60 | HR 69 | Ht 68.0 in | Wt 176.0 lb

## 2023-06-06 DIAGNOSIS — R0789 Other chest pain: Secondary | ICD-10-CM

## 2023-06-06 DIAGNOSIS — Z23 Encounter for immunization: Secondary | ICD-10-CM

## 2023-06-06 MED ORDER — AMBULATORY NON FORMULARY MEDICATION: 99 refills | Status: AC

## 2023-06-06 NOTE — Progress Notes (Signed)
   Established Patient Office Visit  Subjective   Patient ID: Andres Stevens, male    DOB: 06-01-1946  Age: 77 y.o. MRN: [id number]  Chief Complaint  Patient presents with   Hospitalization Follow-up    HPI F/U ED visit from  9/6 for Chest Pain and nausea that had started while he was eating breakfast at the facility..He has been coughing a lot.  In the emergency room hemoglobin was little borderline low at 12.7.  White blood cell count was normal.  Liver and kidney function were normal.  He was negative for COVID and flu.  Cardiac enzymes were also negative x 3.  Chest x-ray was negative.  He was discharged home later in the day and started on pantoprazole 2 tablets daily.  Like his symptoms were most reflective of GERD and atypical chest pain.  He is feeling much better overall he still had some intermittent upper chest discomfort since then.  His wife is here with him today and feels like it really is most likely reflux related as well.  She would like a printed prescription for Mylanta for him to try as needed and see if that is helpful.  We had originally stopped his pantoprazole back in October but it turned out the nursing home had actually still been dosing it daily so he was on a PPI when this happened.   ED notes and labs reviewed.   ROS    Objective:     BP 120/60   Pulse 69   Ht 5\' 8"  (1.727 m)   Wt 176 lb (79.8 kg)   SpO2 100%   BMI 26.76 kg/m    Physical Exam Vitals and nursing note reviewed.  Constitutional:      Appearance: Normal appearance.  HENT:     Head: Normocephalic and atraumatic.  Eyes:     Conjunctiva/sclera: Conjunctivae normal.  Cardiovascular:     Rate and Rhythm: Normal rate and regular rhythm.  Pulmonary:     Effort: Pulmonary effort is normal.     Breath sounds: Normal breath sounds.  Skin:    General: Skin is warm and dry.  Neurological:     Mental Status: He is alert.  Psychiatric:        Mood and Affect: Mood normal.      No  results found for any visits on 06/06/23.    The 10-year ASCVD risk score (Arnett DK, et al., 2019) is: 31.7%    Assessment & Plan:   Problem List Items Addressed This Visit   None Visit Diagnoses     Atypical chest pain    -  Primary   Encounter for immunization       Relevant Orders   Flu Vaccine Trivalent High Dose (Fluad) (Completed)      We discussed protocol for breakthrough symptoms while on a PPI is for further workup.  Consider GI referral.  His wife would like to try the as needed Mylanta first and see if that is helpful also may be slowing down his eating.  He does have a chronic cough that has had that worked up in the past and even had a swallowing study that was normal.  Call if any problems or concerns or if would like to move forward with GI referral.  Return if symptoms worsen or fail to improve.    Nani Gasser, MD

## 2023-06-06 NOTE — Patient Instructions (Signed)
Continue pantoprazole once a day in AM about 20 min before first meal of day.

## 2023-07-11 ENCOUNTER — Other Ambulatory Visit: Payer: Self-pay | Admitting: Family Medicine

## 2023-07-11 DIAGNOSIS — M79674 Pain in right toe(s): Secondary | ICD-10-CM

## 2023-07-12 ENCOUNTER — Ambulatory Visit: Payer: Medicare Other | Admitting: Podiatry

## 2023-07-12 ENCOUNTER — Encounter: Payer: Self-pay | Admitting: Podiatry

## 2023-07-12 DIAGNOSIS — M79675 Pain in left toe(s): Secondary | ICD-10-CM | POA: Diagnosis not present

## 2023-07-12 DIAGNOSIS — M79674 Pain in right toe(s): Secondary | ICD-10-CM | POA: Diagnosis not present

## 2023-07-12 DIAGNOSIS — B351 Tinea unguium: Secondary | ICD-10-CM

## 2023-07-12 DIAGNOSIS — G6289 Other specified polyneuropathies: Secondary | ICD-10-CM

## 2023-07-12 MED ORDER — GABAPENTIN 300 MG PO CAPS: 300.0000 mg | ORAL_CAPSULE | Freq: Every day | ORAL | 3 refills | Status: DC

## 2023-07-12 NOTE — Progress Notes (Signed)
Subjective: 77 y.o. returns the office today for painful, elongated, thickened toenails which he cannot trim himself. Denies any redness or drainage around the nails. Denies any acute changes since last appointment and no new complaints today.Having increased pain and sensitivty in the feet around the toes. Relates burning and tingling.  Denies any systemic complaints such as fevers, chills, nausea, vomiting.   PCP: Nani Gasser MD   Objective: NAD DP/PT pulses palpable, CRT less than 3 seconds Nails hypertrophic, dystrophic, elongated, brittle, discolored 10. There is tenderness overlying the nails 1-5 bilaterally. There is no surrounding erythema or drainage along the nail sites. Tinea pedis has improved.  No open lesions or pre-ulcerative lesions are identified. No other areas of tenderness bilateral lower extremities. No overlying edema, erythema, increased warmth. No pain with calf compression, swelling, warmth, erythema.  Assessment: Patient presents with symptomatic onychomycosis.  Plan: -Treatment options including alternatives, risks, complications were discussed -Nails sharply debrided 10 without complication/bleeding. -Discussed daily foot inspection. If there are any changes, to call the office immediately.  -Discussed neuropathy symptoms and increased symptoms in feet. Will try a course of gabapentin to see if this improves pain. Will start with 300 mg nightly.  -Follow-up in 3 months or sooner if any problems are to arise. In the meantime, encouraged to call the office with any questions, concerns, changes symptoms.  Louann Sjogren, DPM

## 2023-07-20 ENCOUNTER — Ambulatory Visit: Payer: Medicare Other | Admitting: Podiatry

## 2023-08-04 ENCOUNTER — Other Ambulatory Visit: Payer: Self-pay | Admitting: Internal Medicine

## 2023-08-07 ENCOUNTER — Other Ambulatory Visit: Payer: Self-pay | Admitting: Family Medicine

## 2023-08-08 ENCOUNTER — Ambulatory Visit: Payer: Medicare Other | Admitting: Internal Medicine

## 2023-08-08 ENCOUNTER — Encounter: Payer: Self-pay | Admitting: Internal Medicine

## 2023-08-08 VITALS — BP 122/80 | HR 65 | Ht 68.0 in | Wt 178.0 lb

## 2023-08-08 DIAGNOSIS — E162 Hypoglycemia, unspecified: Secondary | ICD-10-CM | POA: Diagnosis not present

## 2023-08-08 MED ORDER — DIAZOXIDE 50 MG/ML PO SUSP: 100.0000 mg | Freq: Two times a day (BID) | ORAL | 3 refills | Status: DC

## 2023-08-08 NOTE — Progress Notes (Signed)
Name: Andres Stevens  MRN/ DOB: [id number], [birth year]    Age/ Sex: 77 y.o., male     PCP: Andres Games, MD   Reason for Endocrinology Evaluation: Elevated Catecholamine     Initial Endocrinology Clinic Visit: 08/30/2020    PATIENT IDENTIFIER: [name] is a 77 y.o., male with a past medical history of Dyslipidemia, bipolar disorder and history of hemorrhagic stroke, S/P parathyroidectomy and vascular dementia . He has followed with [facility] since 08/30/2020 for consultative assistance with management of his elevated catecholamine.      HISTORICAL SUMMARY:  During evaluation for elevated BP readings at home in 07/2120 he was noted to have elevated plasma norepinephrine at 1973 pg/mL, and normetanephrine at 513 pg/ml but with normal epinephrine and metanephrines. His abdominal CT on 08/09/2020 was unrevealing.    He has been on doxazosin  since hemorrhagic stroke ~ 2016 . IN the past few weeks wife noted labile BP's. With SBP as high as 200 mg/dL mainly during the evening and lower in the morning. His Doxazosin dose has been increased to 2 mg and BP has been more manageable .      HYPOGLYCEMIA HISTORY: Pt was admitted to [facility] on 08/31/2020 with a BG of 33 mg/dL during evaluation for altered mental status as well as hypotension. He was treated with IV dextrose through EMS BY his errival to the ED his Bg was 57 mg/dL.   He was treated medically for suspected insulinoma with diazoxide 09/04/2020 . CT pancreatic protocol negative for pancreatic mass . Of note the pt was also diagnosed with Enterococcus UTI and treated with Vancomycin   No biological children  SUBJECTIVE:    Today (08/08/2023):  Andres Stevens is here for a follow up on hypoglycemia . He resides at assisted living, they continue to check glucose occasionally.  No hypoglycemia   He is accompanied by his wife today   He follows with Geriatrics for vascular dementia   Weight  stable   Unsure of palpitations  Has occasional tremors  No hyperhidrosis  Dementia/confusion is worsening in nature He denies dizziness    ENDOCRINE MEDICATIONS Diazoxide 100 mg BID      HISTORY:  Past Medical History:  Past Medical History:  Diagnosis Date   Bipolar 1 disorder (HCC)    Wears hearing aid    Past Surgical History:  Past Surgical History:  Procedure Laterality Date   CERVICAL FUSION     PARATHYROIDECTOMY  03/2016   Social History:  reports that he quit smoking about 8 years ago. His smoking use included cigarettes. He has never used smokeless tobacco. He reports current alcohol use. He reports that he does not use drugs. Family History:  Family History  Problem Relation Age of Onset   Depression Father      HOME MEDICATIONS: Allergies as of 08/08/2023       Reactions   Bee Venom Anaphylaxis   Hx of local rxn        Medication List        Accurate as of August 08, 2023 12:57 PM. If you have any questions, ask your nurse or doctor.          acetaminophen 650 MG CR tablet Commonly known as: TYLENOL TAKE 2 TABLETS BY MOUTH EVERY 8 HOURS AS NEEDED FOR PAIN.   allopurinol 300 MG tablet Commonly known as: ZYLOPRIM TAKE (1) TABLET BY MOUTH ONCE DAILY.   AMBULATORY NON FORMULARY MEDICATION Medication Name: Mylanta  -  15 ml po BID prn for reflux or heart burn.   atorvastatin 40 MG tablet Commonly known as: LIPITOR TAKE (1) TABLET BY MOUTH AT BEDTIME.   B Complex-B12 Tabs Take 1 tablet by mouth daily.   B Complex-C Caps Take 1 capsule by mouth daily.   B-D SINGLE USE SWABS REGULAR Pads USE AS DIRECTED TO CHECK BLOOD SUGARS.   cetirizine 10 MG tablet Commonly known as: ZYRTEC TAKE (1) TABLET BY MOUTH ONCE DAILY.   Cholecalciferol 125 MCG (5000 UT) capsule Take 2 capsules (10,000 Units total) by mouth daily.   diazoxide 50 MG/ML suspension Commonly known as: PROGLYCEM TAKE 2 ML BY MOUTH  IN THE MORNING AND AT BEDTIME    doxazosin 1 MG tablet Commonly known as: CARDURA Take 1 tablet (1 mg total) by mouth at bedtime.   doxazosin 2 MG tablet Commonly known as: CARDURA TAKE 1/2 TABLET (1MG ) BY MOUTH DAILY AS NEEDED FOR SYSTOLIC BLOOD PRESSURE GREATER THAN 145.   EasyMax Test test strip Generic drug: glucose blood TEST BLOOD SUGAR ONCE DAILY.   escitalopram 20 MG tablet Commonly known as: LEXAPRO TAKE (1) TABLET BY MOUTH ONCE DAILY.   gabapentin 300 MG capsule Commonly known as: NEURONTIN Take 1 capsule (300 mg total) by mouth at bedtime.   lamoTRIgine 100 MG tablet Commonly known as: LAMICTAL TAKE (1) TABLET BY MOUTH AT BEDTIME.   LORazepam 0.5 MG tablet Commonly known as: ATIVAN TAKE (1) TABLET BY MOUTH ONCE DAILY AS NEEDED FOR ANXIETY.   memantine 10 MG tablet Commonly known as: NAMENDA Take 1 tablet (10 mg total) by mouth in the morning and at bedtime.   pantoprazole 40 MG tablet Commonly known as: PROTONIX TAKE (1) TABLET BY MOUTH ONCE DAILY.   QUEtiapine 25 MG tablet Commonly known as: SEROQUEL Take by mouth.   ReliOn Lancet Devices 30G Misc 1 Device by Does not apply route daily.          OBJECTIVE:   PHYSICAL EXAM: VS: Ht 5\' 8"  (1.727 m)   Wt 178 lb (80.7 kg)   BMI 27.06 kg/m    EXAM: General: Pt appears well and is in NAD  Lungs: Clear with good BS bilat   Heart: Auscultation: RRR.  Extremities:  BL LE: no  pretibial edema   Mental Status:  Mood and affect: No depression, anxiety, or agitation     DATA REVIEWED:  Latest Reference Range & Units 02/06/23 11:41  Sodium 135 - 146 mmol/L 143  Potassium 3.5 - 5.3 mmol/L 3.9  Chloride 98 - 110 mmol/L 105  CO2 20 - 32 mmol/L 29  Glucose 65 - 99 mg/dL 81  BUN 7 - 25 mg/dL 17  Creatinine 7.82 - 9.56 mg/dL 2.13  Calcium 8.6 - 08.6 mg/dL 9.5  BUN/Creatinine Ratio 6 - 22 (calc) SEE NOTE:  eGFR > OR = 60 mL/min/1.26m2 60  AG Ratio 1.0 - 2.5 (calc) 2.3  AST 10 - 35 U/L 20  ALT 9 - 46 U/L 24  Total Protein 6.1  - 8.1 g/dL 6.6  Total Bilirubin 0.2 - 1.2 mg/dL 0.6      57/04/4695   2:95 AM  Insulin 2.7 uU/mL  Proinsulin 8.7 uU/mL    No concomitante serum glucose   But has a finger stick of 100 at 2:30 Am      CT abdomen /Pelvis 09/09/2020  CT ABDOMEN: Normal pancreas. No evidence for pancreatic tumor. Lung bases demonstrate a small right pleural effusion with dependent right lower lobe subsegmental  atelectasis. Thick-walled esophagus suggesting esophagitis. Normal heart size. Normal liver, gallbladder, biliary tree, pancreas, spleen, and adrenal glands. Small kidneys with bilateral renal cortical cysts. Aortoiliac atherosclerosis without aneurysm. No ascites or adenopathy.   CT PELVIS: Fat-containing bilateral inguinal hernias. Normal bladder, prostate, and seminal vesicles. Nonspecific gaseous distention of stomach, small bowel, and colon. No pelvic mass, adenopathy, or fluid collection. Multilevel lumbar DDD.    IMPRESSION: No evidence for pancreatic tumor.        ASSESSMENT / PLAN / RECOMMENDATIONS:   1. Hypoglycemia :   - High clinical suspicion for insulinoma , unfortunately no biochemical tests to confirm this it during hospitalizations (08/2020) and trying to confirm this bio chemically will cause hardship on the patient due to  dementia.  -We have opted to treat him medically - Negative CT imaging does NOT exclude insulinoma as they tend to be small and require endoscopic ultrasound  - No changes    Medication Continue Diazoxide 100 mg BID     F/U in 1 year   Signed electronically by: Lyndle Herrlich, MD  [facility]  [facility] [address] [place], [place] [address] Phone: [phone] FAX: [phone]      CC: Andres Games, MD [address] [facility] [address] [place] [address] Phone: [phone]  Fax: [phone]   Return to Endocrinology clinic as below: Future Appointments  Date Time  Provider Department Center  08/08/2023  1:00 PM Deauna Yaw, Konrad Dolores, MD [facility] None  08/09/2023  1:20 PM Andres Games, MD [facility] None  10/11/2023 11:15 AM Louann Sjogren, DPM [facility] None

## 2023-08-08 NOTE — Patient Instructions (Signed)
-   Diazoxide 100 mg ( 2 mL ) TWICE daily

## 2023-08-09 ENCOUNTER — Ambulatory Visit: Payer: Medicare Other | Admitting: Family Medicine

## 2023-08-17 ENCOUNTER — Other Ambulatory Visit: Payer: Self-pay | Admitting: Family Medicine

## 2023-09-06 ENCOUNTER — Encounter: Payer: Self-pay | Admitting: Family Medicine

## 2023-09-06 ENCOUNTER — Ambulatory Visit (INDEPENDENT_AMBULATORY_CARE_PROVIDER_SITE_OTHER): Payer: Medicare Other | Admitting: Family Medicine

## 2023-09-06 VITALS — BP 136/64 | HR 64 | Ht 68.0 in | Wt 178.0 lb

## 2023-09-06 DIAGNOSIS — M79672 Pain in left foot: Secondary | ICD-10-CM

## 2023-09-06 DIAGNOSIS — G301 Alzheimer's disease with late onset: Secondary | ICD-10-CM

## 2023-09-06 DIAGNOSIS — M79671 Pain in right foot: Secondary | ICD-10-CM | POA: Diagnosis not present

## 2023-09-06 DIAGNOSIS — F0150 Vascular dementia without behavioral disturbance: Secondary | ICD-10-CM

## 2023-09-06 DIAGNOSIS — N1831 Chronic kidney disease, stage 3a: Secondary | ICD-10-CM | POA: Diagnosis not present

## 2023-09-06 DIAGNOSIS — M7989 Other specified soft tissue disorders: Secondary | ICD-10-CM | POA: Diagnosis not present

## 2023-09-06 DIAGNOSIS — F02B4 Dementia in other diseases classified elsewhere, moderate, with anxiety: Secondary | ICD-10-CM

## 2023-09-06 NOTE — Patient Instructions (Addendum)
Stop gabapentin x 2 weeks to see if swelling is better and if pain is better.   If better then we can try something else for foot pain  Can try capsicin cream on feet twice a day and epsom salt foot soaks.

## 2023-09-06 NOTE — Progress Notes (Signed)
Pt reports that his feet mostly his toes hurt bilaterally. He was seen by podiatry on October 17th and was started on Gabapentin. He stated that it hurts to walk. His feet are swollen.

## 2023-09-06 NOTE — Progress Notes (Signed)
Established Patient Office Visit  Subjective  Patient ID: Andres Stevens, male    DOB: 08/17/1946  Age: 77 y.o. MRN: [id number]  Chief Complaint  Patient presents with   Chronic Kidney Disease         HPI   F/U CKD 3 -here for follow-up CKD 3.  No recent changes.  F/U vascular dementia.  He still currently living in a nursing home.  His wife brought him in today she feels like he is fairly stable he is had a good appetite he is eating well.  Bilat foot pain - both feet and ankles are swollen.  Was started on gabapentin a few months ago by podiatry for neuropathy.  But his wife notes he is actually been complaining a little bit more than usual about his feet and he is also had bilateral lower extremity swelling he was not sure if it might be his gout.    ROS    Objective:     BP 136/64   Pulse 64   Ht 5\' 8"  (1.727 m)   Wt 178 lb (80.7 kg)   SpO2 100%   BMI 27.06 kg/m    Physical Exam Vitals and nursing note reviewed.  Constitutional:      Appearance: Normal appearance.  HENT:     Head: Normocephalic and atraumatic.  Eyes:     Conjunctiva/sclera: Conjunctivae normal.  Cardiovascular:     Rate and Rhythm: Normal rate and regular rhythm.  Pulmonary:     Effort: Pulmonary effort is normal.     Breath sounds: Normal breath sounds.  Skin:    General: Skin is warm and dry.  Neurological:     Mental Status: He is alert.  Psychiatric:        Mood and Affect: Mood normal.      Results for orders placed or performed in visit on 09/06/23  Vitamin B1  Result Value Ref Range   Thiamine WILL FOLLOW   Vitamin B6  Result Value Ref Range   Vitamin B6 WILL FOLLOW   B12  Result Value Ref Range   Vitamin B-12 816 232 - 1,245 pg/mL  B Nat Peptide  Result Value Ref Range   BNP WILL FOLLOW   TSH  Result Value Ref Range   TSH 1.360 0.450 - 4.500 uIU/mL      The 10-year ASCVD risk score (Arnett DK, et al., 2019) is: 37.9%    Assessment & Plan:   Problem  List Items Addressed This Visit       Nervous and Auditory   Moderate dementia with anxiety (HCC)   Followed by Dr. Alfredia Ferguson at [facility].  Currently on [place].  Now on 24-hour care in a nursing home.  Recently followed up in September they did not make any major changes but are planning on potentially reducing the Seroquel.        Genitourinary   CKD (chronic kidney disease) stage 3, GFR 30-59 ml/min (HCC) - Primary   Relevant Orders   Vitamin B1 (Completed)   Vitamin B6 (Completed)   B12 (Completed)   B Nat Peptide (Completed)   TSH (Completed)   Other Visit Diagnoses       Foot pain, bilateral       Relevant Orders   Vitamin B1 (Completed)   Vitamin B6 (Completed)   B12 (Completed)   B Nat Peptide (Completed)   TSH (Completed)     Localized swelling of both lower extremities  Relevant Orders   Vitamin B1 (Completed)   Vitamin B6 (Completed)   B12 (Completed)   B Nat Peptide (Completed)   TSH (Completed)      Lower extremity swelling-we did discuss that gabapentin can cause lower extremity edema so it is unclear at this point if it could be causing some of the swelling.  Encouraged him to stop the gabapentin for at least 2 weeks to see if he notices any improvement in the swelling we will also check for thyroid dysfunction.  Just had normal liver enzymes done in September when he was in the hospital.  Evaluate for neuropathy -will evaluate for mineral deficiencies.  If normal then we could consider a trial of topical product like capsaicin to help with neuropathy.   Return in about 6 months (around 03/06/2024) for kidney check and memory .    Nani Gasser, MD

## 2023-09-07 NOTE — Assessment & Plan Note (Addendum)
Followed by Dr. Alfredia Ferguson at [facility].  Currently on [place].  Now on 24-hour care in a nursing home.  Recently followed up in September they did not make any major changes but are planning on potentially reducing the Seroquel.

## 2023-09-10 NOTE — Progress Notes (Signed)
Call patient's wife and let her know that there is no sign of thyroid disorder or heart failure which is great.  Still awaiting the mineral levels.

## 2023-09-13 NOTE — Progress Notes (Signed)
Vitamin B1 looks great.  Vitamin B12 is normal.  B6 is still pending.

## 2023-09-15 LAB — VITAMIN B12: Vitamin B-12: 816 pg/mL (ref 232–1245)

## 2023-09-15 LAB — TSH: TSH: 1.36 u[IU]/mL (ref 0.450–4.500)

## 2023-09-15 LAB — VITAMIN B1: Thiamine: 186.6 nmol/L (ref 66.5–200.0)

## 2023-09-15 LAB — VITAMIN B6: Vitamin B6: 21 ug/L (ref 3.4–65.2)

## 2023-09-15 LAB — BRAIN NATRIURETIC PEPTIDE: BNP: 7.2 pg/mL (ref 0.0–100.0)

## 2023-09-17 ENCOUNTER — Ambulatory Visit (INDEPENDENT_AMBULATORY_CARE_PROVIDER_SITE_OTHER): Payer: Medicare Other | Admitting: Sports Medicine

## 2023-09-17 ENCOUNTER — Encounter: Payer: Self-pay | Admitting: Sports Medicine

## 2023-09-17 ENCOUNTER — Ambulatory Visit: Payer: Medicare Other

## 2023-09-17 ENCOUNTER — Other Ambulatory Visit (INDEPENDENT_AMBULATORY_CARE_PROVIDER_SITE_OTHER): Payer: Medicare Other

## 2023-09-17 DIAGNOSIS — M170 Bilateral primary osteoarthritis of knee: Secondary | ICD-10-CM

## 2023-09-17 MED ORDER — DICLOFENAC SODIUM 1 % EX GEL: 4.0000 g | Freq: Four times a day (QID) | CUTANEOUS | 11 refills | Status: DC

## 2023-09-17 MED ORDER — TRIAMCINOLONE ACETONIDE 40 MG/ML IJ SUSP
80.0000 mg | Freq: Once | INTRAMUSCULAR | Status: AC
  Administered 2023-09-17: 80 mg via INTRAMUSCULAR

## 2023-09-17 NOTE — Progress Notes (Signed)
    Procedures performed today:    Procedure: Real-time Ultrasound Guided injection of the left knee Device: Samsung HS60  Verbal informed consent obtained.  Time-out conducted.  Noted no overlying erythema, induration, or other signs of local infection.  Skin prepped in a sterile fashion.  Local anesthesia: Topical Ethyl chloride.  With sterile technique and under real time ultrasound guidance: 1 cc Kenalog 40, 2 cc lidocaine, 2 cc bupivacaine injected easily Completed without difficulty  Advised to call if fevers/chills, erythema, induration, drainage, or persistent bleeding.  Images permanently stored and available for review in PACS.  Impression: Technically successful ultrasound guided injection.   Procedure: Real-time Ultrasound Guided injection of the right knee Device: Samsung HS60  Verbal informed consent obtained.  Time-out conducted.  Noted no overlying erythema, induration, or other signs of local infection.  Skin prepped in a sterile fashion.  Local anesthesia: Topical Ethyl chloride.  With sterile technique and under real time ultrasound guidance: 1 cc Kenalog 40, 2 cc lidocaine, 2 cc bupivacaine injected easily Completed without difficulty  Advised to call if fevers/chills, erythema, induration, drainage, or persistent bleeding.  Images permanently stored and available for review in PACS.  Impression: Technically successful ultrasound guided injection.  Independent interpretation of notes and tests performed by another provider:   None.  Brief History, Exam, Impression, and Recommendations:    Primary osteoarthritis of both knees Very pleasant 77 year old male, known bilateral knee osteoarthritis, last injection right knee was back in 2019, now with recurrence of bilateral knee pain, repeat bilateral injections, I would like updated x-rays, home physical therapy, topical Voltaren, return to see me in 6 weeks as  needed.    ____________________________________________ Ihor Austin. Benjamin Stain, M.D., ABFM., CAQSM., AME. Primary Care and Sports Medicine [facility]  Adjunct Professor of Family Medicine  [employer]  Restaurant manager, fast food

## 2023-09-17 NOTE — Assessment & Plan Note (Signed)
Very pleasant 77 year old male, known bilateral knee osteoarthritis, last injection right knee was back in 2019, now with recurrence of bilateral knee pain, repeat bilateral injections, I would like updated x-rays, home physical therapy, topical Voltaren, return to see me in 6 weeks as needed.

## 2023-09-17 NOTE — Progress Notes (Signed)
Vitamin B6 looks great.

## 2023-10-09 ENCOUNTER — Other Ambulatory Visit: Payer: Self-pay | Admitting: Internal Medicine

## 2023-10-10 ENCOUNTER — Other Ambulatory Visit: Payer: Self-pay

## 2023-10-10 MED ORDER — RELION LANCET DEVICES 30G MISC: 1.0000 | Freq: Every day | 3 refills | Status: DC

## 2023-10-11 ENCOUNTER — Ambulatory Visit: Payer: Medicare Other | Admitting: Podiatry

## 2023-10-12 ENCOUNTER — Encounter: Payer: Self-pay | Admitting: Family Medicine

## 2023-10-12 ENCOUNTER — Ambulatory Visit: Payer: Medicare Other | Admitting: Podiatry

## 2023-10-15 ENCOUNTER — Other Ambulatory Visit: Payer: Self-pay | Admitting: Family Medicine

## 2023-10-18 NOTE — Addendum Note (Signed)
Addended by: Nani Gasser D on: 10/18/2023 04:26 PM   Modules accepted: Orders

## 2023-10-18 NOTE — Telephone Encounter (Signed)
Call nursing home and cancel order for Zyrtec.

## 2023-10-19 ENCOUNTER — Encounter: Payer: Self-pay | Admitting: Family Medicine

## 2023-10-19 NOTE — Telephone Encounter (Signed)
Please see previous note in the conversation.  I had already put a note in there about sending an order to [facility] the Zyrtec.

## 2023-10-19 NOTE — Telephone Encounter (Signed)
Note written in chart to be printed and faxed.

## 2023-10-30 ENCOUNTER — Ambulatory Visit (INDEPENDENT_AMBULATORY_CARE_PROVIDER_SITE_OTHER): Payer: Medicare Other | Admitting: Sports Medicine

## 2023-10-30 ENCOUNTER — Ambulatory Visit: Payer: Medicare Other

## 2023-10-30 ENCOUNTER — Encounter: Payer: Self-pay | Admitting: Sports Medicine

## 2023-10-30 DIAGNOSIS — L03031 Cellulitis of right toe: Secondary | ICD-10-CM

## 2023-10-30 DIAGNOSIS — M85871 Other specified disorders of bone density and structure, right ankle and foot: Secondary | ICD-10-CM | POA: Diagnosis not present

## 2023-10-30 DIAGNOSIS — M2011 Hallux valgus (acquired), right foot: Secondary | ICD-10-CM

## 2023-10-30 DIAGNOSIS — M7989 Other specified soft tissue disorders: Secondary | ICD-10-CM | POA: Diagnosis not present

## 2023-10-30 DIAGNOSIS — M79671 Pain in right foot: Secondary | ICD-10-CM | POA: Diagnosis not present

## 2023-10-30 DIAGNOSIS — R2241 Localized swelling, mass and lump, right lower limb: Secondary | ICD-10-CM | POA: Diagnosis not present

## 2023-10-30 MED ORDER — DOXYCYCLINE HYCLATE 100 MG PO TABS: 100.0000 mg | ORAL_TABLET | Freq: Two times a day (BID) | ORAL | 0 refills | Status: DC

## 2023-10-30 MED ORDER — DOXYCYCLINE HYCLATE 100 MG PO TABS: 100.0000 mg | ORAL_TABLET | Freq: Two times a day (BID) | ORAL | 0 refills | Status: AC

## 2023-10-30 NOTE — Progress Notes (Signed)
    Procedures performed today:    None.  Independent interpretation of notes and tests performed by another provider:   None.  Brief History, Exam, Impression, and Recommendations:    Paronychia of great toe, right This is a very pleasant 78 year old male, he does have a history of dementia, for the past several weeks she has had increasing pain right lower leg with swelling from the knee down, he endorses significant discomfort at the great toe, he does appear to have a medial paronychia of the great toe itself, with severe tenderness to palpation, it is erythematous but not fluctuant and there is no drainage. He also has an ingrown great toenail. There is also mild tenderness at the first MTP and lesser so throughout the rest of the foot with 2+ to 3+ pitting edema. The visit today is predominately for pain and swelling, so we will get right lower extremity DVT ultrasound, they will get some lower extremity compression hose, medium and wear it throughout the day. We will do a course of doxycycline  for the paronychia, I would like to see him back in approximately 2 to 3 weeks and we can consider nail plate excision if insufficient improvement.  Of note BNP was recently normal, normal renal function, liver function and normal microalbumin creatinine ratio middle of last year.  His weights have been stable over the past year and a half.  If compression ineffective I do think we need to reconsider his doxazosin  as this can also cause significant lower extremity swelling.    ____________________________________________ Debby PARAS. Curtis, M.D., ABFM., CAQSM., AME. Primary Care and Sports Medicine [facility]  Adjunct Professor of [facility] [employer] Medicine  [employer]  Restaurant Manager, [employer]

## 2023-10-30 NOTE — Addendum Note (Signed)
Addended by: Carren Rang A on: 10/30/2023 11:14 AM   Modules accepted: Orders

## 2023-10-30 NOTE — Assessment & Plan Note (Addendum)
 This is a very pleasant 78 year old male, he does have a history of dementia, for the past several weeks she has had increasing pain right lower leg with swelling from the knee down, he endorses significant discomfort at the great toe, he does appear to have a medial paronychia of the great toe itself, with severe tenderness to palpation, it is erythematous but not fluctuant and there is no drainage. He also has an ingrown great toenail. There is also mild tenderness at the first MTP and lesser so throughout the rest of the foot with 2+ to 3+ pitting edema. The visit today is predominately for pain and swelling, so we will get right lower extremity DVT ultrasound, they will get some lower extremity compression hose, medium and wear it throughout the day. We will do a course of doxycycline  for the paronychia, I would like to see him back in approximately 2 to 3 weeks and we can consider nail plate excision if insufficient improvement.  Of note BNP was recently normal, normal renal function, liver function and normal microalbumin creatinine ratio middle of last year.  His weights have been stable over the past year and a half.  If compression ineffective I do think we need to reconsider his doxazosin  as this can also cause significant lower extremity swelling.

## 2023-11-08 ENCOUNTER — Encounter: Payer: Self-pay | Admitting: Family Medicine

## 2023-11-09 ENCOUNTER — Ambulatory Visit (INDEPENDENT_AMBULATORY_CARE_PROVIDER_SITE_OTHER): Payer: Medicare Other | Admitting: Family Medicine

## 2023-11-09 ENCOUNTER — Encounter: Payer: Self-pay | Admitting: Family Medicine

## 2023-11-09 VITALS — BP 118/67 | HR 74 | Ht 68.0 in | Wt 185.0 lb

## 2023-11-09 DIAGNOSIS — M25475 Effusion, left foot: Secondary | ICD-10-CM

## 2023-11-09 DIAGNOSIS — M25471 Effusion, right ankle: Secondary | ICD-10-CM | POA: Diagnosis not present

## 2023-11-09 DIAGNOSIS — M25474 Effusion, right foot: Secondary | ICD-10-CM | POA: Diagnosis not present

## 2023-11-09 DIAGNOSIS — R300 Dysuria: Secondary | ICD-10-CM

## 2023-11-09 DIAGNOSIS — L03031 Cellulitis of right toe: Secondary | ICD-10-CM | POA: Diagnosis not present

## 2023-11-09 DIAGNOSIS — M25472 Effusion, left ankle: Secondary | ICD-10-CM

## 2023-11-09 MED ORDER — FUROSEMIDE 10 MG/ML IJ SOLN
20.0000 mg | Freq: Once | INTRAMUSCULAR | Status: AC
  Administered 2023-11-09: 20 mg via INTRAMUSCULAR

## 2023-11-09 NOTE — Telephone Encounter (Signed)
Patient scheduled.

## 2023-11-09 NOTE — Progress Notes (Signed)
Pt's wife reports that she has noticed the past few days that his R leg has been swelling. She also noticed that he has some facial swelling and a scab on the crown of his head.

## 2023-11-09 NOTE — Progress Notes (Signed)
Acute Office Visit  Subjective:     Patient ID: Andres Stevens, male    DOB: 25-Jul-1946, 78 y.o.   MRN: [id number]  Chief Complaint  Patient presents with   Leg Swelling    R leg swelling     HPI Patient is in today for  Pt's wife reports that she has noticed the past few days that his R leg has been swelling. She also noticed that he has some facial swelling and a scab on the crown of his head.    He was just seen on 10/30/23 and had a right LE [place] that was neg for DVT.  Also had a right foot film done.  Has not been read yet.   His wife was visiting him at the assisted living last night and noticed the left led swelling was much worse.  No recent changes except had restarted gabapentin.    He is still having pain at his medial border of his great toenail. He is on ABX and has a f/u appt with sports med for possible nail removal.    ROS      Objective:    BP 118/67   Pulse 74   Ht 5\' 8"  (1.727 m)   Wt 185 lb (83.9 kg)   SpO2 100%   BMI 28.13 kg/m    Physical Exam Vitals and nursing note reviewed.  Constitutional:      Appearance: Normal appearance.  HENT:     Head: Normocephalic and atraumatic.  Eyes:     Conjunctiva/sclera: Conjunctivae normal.  Cardiovascular:     Rate and Rhythm: Normal rate and regular rhythm.  Pulmonary:     Effort: Pulmonary effort is normal.     Breath sounds: Normal breath sounds.  Musculoskeletal:     Comments: Right leg with 1+ pitting edema and trace on left ankle.  1+ pitting in both feet.  DP pulses 2+ bilaterally. No skin changes.  Tender along the medial nail border on left great toe.   Skin:    General: Skin is warm and dry.  Neurological:     Mental Status: He is alert.  Psychiatric:        Mood and Affect: Mood normal.     No results found for any visits on 11/09/23.      Assessment & Plan:   Problem List Items Addressed This Visit       Musculoskeletal and Integument   Paronychia of great toe, right    Other Visit Diagnoses       Bilateral swelling of feet and ankles    -  Primary   Relevant Medications   furosemide (LASIX) injection 20 mg (Completed)   furosemide (LASIX) injection 20 mg (Completed)   Other Relevant Orders   CBC with Differential/Platelet   CMP14+EGFR   B Nat Peptide   Urinalysis, Routine w reflex microscopic   Urine Culture     Dysuria       Relevant Orders   Urine Culture      Dysuria - While giving a urine sample today ha noted some burning so will send UA for culture.   LE swelling will gets labs to rule out other causes. Will given IM lasix injection here today. Stop gabapentin which is most likey culprit  Paronychia, left great toe - return for partial nail removal with me or Dr. Karie Schwalbe as his convenience.    Meds ordered this encounter  Medications   furosemide (LASIX) injection 20 mg  furosemide (LASIX) injection 20 mg    Return if symptoms worsen or fail to improve.  Nani Gasser, MD

## 2023-11-09 NOTE — Telephone Encounter (Signed)
I agree lets see if we can get him in today.  I would be concerned about the possibility of a DVT.

## 2023-11-11 LAB — CMP14+EGFR
ALT: 23 [IU]/L (ref 0–44)
AST: 21 [IU]/L (ref 0–40)
Albumin: 4.3 g/dL (ref 3.8–4.8)
Alkaline Phosphatase: 95 [IU]/L (ref 44–121)
BUN/Creatinine Ratio: 14 (ref 10–24)
BUN: 15 mg/dL (ref 8–27)
Bilirubin Total: 0.5 mg/dL (ref 0.0–1.2)
CO2: 25 mmol/L (ref 20–29)
Calcium: 9.4 mg/dL (ref 8.6–10.2)
Chloride: 104 mmol/L (ref 96–106)
Creatinine, Ser: 1.05 mg/dL (ref 0.76–1.27)
Globulin, Total: 1.9 g/dL (ref 1.5–4.5)
Glucose: 135 mg/dL — ABNORMAL HIGH (ref 70–99)
Potassium: 3.9 mmol/L (ref 3.5–5.2)
Sodium: 142 mmol/L (ref 134–144)
Total Protein: 6.2 g/dL (ref 6.0–8.5)
eGFR: 72 mL/min/{1.73_m2} (ref >59)

## 2023-11-11 LAB — CBC WITH DIFFERENTIAL/PLATELET
Basophils Absolute: 0 10*3/uL (ref 0.0–0.2)
Basos: 0 %
EOS (ABSOLUTE): 0.2 10*3/uL (ref 0.0–0.4)
Eos: 3 %
Hematocrit: 35.8 % — ABNORMAL LOW (ref 37.5–51.0)
Hemoglobin: 12 g/dL — ABNORMAL LOW (ref 13.0–17.7)
Immature Grans (Abs): 0.1 10*3/uL (ref 0.0–0.1)
Immature Granulocytes: 1 %
Lymphocytes Absolute: 1 10*3/uL (ref 0.7–3.1)
Lymphs: 21 %
MCH: 31 pg (ref 26.6–33.0)
MCHC: 33.5 g/dL (ref 31.5–35.7)
MCV: 93 fL (ref 79–97)
Monocytes Absolute: 0.4 10*3/uL (ref 0.1–0.9)
Monocytes: 9 %
Neutrophils Absolute: 3.3 10*3/uL (ref 1.4–7.0)
Neutrophils: 66 %
Platelets: 133 10*3/uL — ABNORMAL LOW (ref 150–450)
RBC: 3.87 x10E6/uL — ABNORMAL LOW (ref 4.14–5.80)
RDW: 14.1 % (ref 11.6–15.4)
WBC: 4.9 10*3/uL (ref 3.4–10.8)

## 2023-11-11 LAB — URINALYSIS, ROUTINE W REFLEX MICROSCOPIC
Bilirubin, UA: NEGATIVE
Leukocytes,UA: NEGATIVE
Nitrite, UA: NEGATIVE
Protein,UA: NEGATIVE
RBC, UA: NEGATIVE
Specific Gravity, UA: 1.023 (ref 1.005–1.030)
Urobilinogen, Ur: 0.2 mg/dL (ref 0.2–1.0)
pH, UA: 5.5 (ref 5.0–7.5)

## 2023-11-11 LAB — BRAIN NATRIURETIC PEPTIDE: BNP: 6.6 pg/mL (ref 0.0–100.0)

## 2023-11-11 LAB — URINE CULTURE

## 2023-11-12 ENCOUNTER — Encounter: Payer: Self-pay | Admitting: Family Medicine

## 2023-11-12 NOTE — Progress Notes (Signed)
 Hi [name], Andres Stevens's hemoglobin is still around 12 so fairly stable.  Platelets are still a little low similar to about 9 months ago and that 130 range.  Metabolic panel including liver and kidney overall looks good.  Urine sample looks okay.  Urine culture was negative.  So no sign of urinary tract infection.  No sign of heart failure.  I really do think it is the gabapentin causing the lower extremity swelling.  So hopefully this will improve greatly over the next couple of weeks as the medication wears off.

## 2023-11-13 ENCOUNTER — Encounter: Payer: Self-pay | Admitting: Physician Assistant

## 2023-11-13 ENCOUNTER — Ambulatory Visit (INDEPENDENT_AMBULATORY_CARE_PROVIDER_SITE_OTHER): Payer: Medicare Other | Admitting: Physician Assistant

## 2023-11-13 VITALS — BP 147/85 | HR 74 | Ht 68.0 in | Wt 184.5 lb

## 2023-11-13 DIAGNOSIS — L600 Ingrowing nail: Secondary | ICD-10-CM

## 2023-11-13 DIAGNOSIS — M25475 Effusion, left foot: Secondary | ICD-10-CM

## 2023-11-13 DIAGNOSIS — M25471 Effusion, right ankle: Secondary | ICD-10-CM | POA: Diagnosis not present

## 2023-11-13 DIAGNOSIS — M25474 Effusion, right foot: Secondary | ICD-10-CM

## 2023-11-13 DIAGNOSIS — M25472 Effusion, left ankle: Secondary | ICD-10-CM

## 2023-11-13 NOTE — Patient Instructions (Addendum)
 Tylenol for pain Keep covered in original bandage for 24 hours( Wednesday evening) Can do epson water soaks daily before change dressing and placing topical antibiotic ointment over toe.  Keep bandage day and night for 1 week and change daily 2nd week keep bandage off at night 3rd week no bandage typically needed Wear open toe or loose fitting shoes Keep legs elevated at much as possible  Fingernail or Toenail Removal, Adult, Care After The following information offers guidance on how to care for yourself after your procedure. Your health care provider may also give you more specific instructions. If you have problems or questions, contact your health care provider. What can I expect after the procedure? After the procedure, it is common to have: Pain. Redness. Swelling. Soreness. Drainage of germ-free (sterile) fluid from the nail bed. Follow these instructions at home: Medicines Take over-the-counter and prescription medicines only as told by your health care provider. If you were prescribed antibiotics, take or apply them as told by your health care provider. Do not stop using the antibiotic even if you start to feel better. Wound care Follow instructions from your health care provider about how to take care of your wound. Make sure you: Wash your hands with soap and water for at least 20 seconds before and after you change your bandage (dressing). If soap and water are not available, use hand sanitizer. Change your dressing as told by your health care provider. Leave stitches (sutures), skin glue, or adhesive strips in place. These skin closures may need to stay in place for 2 weeks or longer. If adhesive strip edges start to loosen and curl up, you may trim the loose edges. Do not remove adhesive strips completely unless your health care provider tells you to do that. Keep your dressing dry until your health care provider says it can be removed. Check your wound every day for signs  of infection. Check for: More redness, swelling, or pain. More fluid or blood. Warmth. Pus or a bad smell. If you have a splint:  Wear the splint as told by your health care provider. Remove it only as told by your health care provider. Loosen the splint if your fingers or toes tingle, become numb, or turn cold and blue. Keep the splint clean. If the splint is not waterproof: Do not let it get wet. Cover it with a watertight covering when you take a bath or shower. Managing pain, stiffness, and swelling Move your fingers or toes often to reduce stiffness and swelling. Raise (elevate) the injured area above the level of your heart while you are sitting or lying down. You may need to keep your hand or foot elevated or supported on a pillow for 24 hours or as told by your health care provider. General instructions If you were given a shoe to wear, wear it as told by your health care provider. Keep all follow-up visits. Your health care provider will need to check your wound to see how it is healing. Contact a health care provider if: You have any of these signs of infection: You have more redness, swelling, or pain around your wound. You have more fluid or blood coming from your wound. Your wound feels warm to the touch. You have pus or a bad smell coming from your wound. You have a fever. Get help right away if: Your finger or toe looks pale, blue, or black. You are not able to move your finger or toe. Summary After the procedure, it  is common to have pain and swelling. Keep the hand or foot elevated or supported on a pillow as told by your health care provider. Take over-the-counter and prescription medicines only as told by your health care provider. Check your wound every day for signs of infection. This information is not intended to replace advice given to you by your health care provider. Make sure you discuss any questions you have with your health care provider. Document  Revised: 12/27/2021 Document Reviewed: 12/27/2021 Elsevier Patient Education  2024 [employer].

## 2023-11-13 NOTE — Progress Notes (Signed)
   Established Patient Office Visit  Subjective   Patient ID: Andres Stevens, male    DOB: April 03, 1946  Age: 78 y.o. MRN: [id number]  HPI Patient is a 78 yo male with a chief complain of right great lateral toe pain.  He was seen on 2/4 and 2/14 and diagnosed with Paronychia of the right great toe. He is here today for toe nail removal.   He has been struggling with bilateral feet and leg swelling as well. PCP suspects it is the gabapentin causing this. He has stopped this medication.       Objective:     BP (!) 147/85 (BP Location: Left Arm, Patient Position: Sitting, Cuff Size: Large)   Pulse 74   Ht 5\' 8"  (1.727 m)   Wt 184 lb 8 oz (83.7 kg)   SpO2 95%   BMI 28.05 kg/m  BP Readings from Last 3 Encounters:  11/13/23 (!) 147/85  11/09/23 118/67  09/06/23 136/64   Wt Readings from Last 3 Encounters:  11/13/23 184 lb 8 oz (83.7 kg)  11/09/23 185 lb (83.9 kg)  09/06/23 178 lb (80.7 kg)    Toenail Avulsion Procedure Note  Pre-operative Diagnosis: Right Ingrown Great toenail   Post-operative Diagnosis: Right Ingrown Great toenail  Indications: pain  Anesthesia: Lidocaine 1% without epinephrine without added sodium bicarbonate  Procedure Details  History of allergy to iodine: no  The risks (including bleeding and infection) and benefits of the  procedure and Verbal informed consent obtained.  After digital block anesthesia was obtained, a tourniquet was applied for hemostasis during the procedure.  After prepping with Betadine, the offending edge of the nail was freed from the nailbed and perionychium, and then split with scissors and removed with  forceps.  All visible granulation tissue is debrided. Antibiotic and bulky dressing was applied.   Findings: Ingrown lateral great toenail  Complications: bleeding.  Plan: 1. Soak the foot twice daily. Change dressing twice daily until healed over. 2. Warning signs of infection were reviewed.   3. Recommended that  the patient use OTC acetaminophen as needed for pain.  4. Return in 2 weeks.  Physical Exam GREAT Lateral toe tenderness to palpation and evidence of ingrown toenail.  2+ pitting edema of right leg Scant pitting edema of left leg  The 10-year ASCVD risk score (Arnett DK, et al., 2019) is: 42.2%    Assessment & Plan:  Marland KitchenMarland KitchenErvan was seen today for nail problem.  Diagnoses and all orders for this visit:  Ingrown right greater toenail  Bilateral swelling of feet and ankles   Partial Toenail removal done in office Discussed post removal care Continue to wear compression stocking and keep legs elevated to help with swelling Follow up with PCP in 2 weeks of sooner if signs of infection or toe or swelling not improving   Return in about 2 weeks (around 11/27/2023) for Follow up.    Tandy Gaw, PA-C

## 2023-11-15 ENCOUNTER — Other Ambulatory Visit: Payer: Self-pay | Admitting: Family Medicine

## 2023-11-22 ENCOUNTER — Ambulatory Visit: Payer: Medicare Other | Admitting: Sports Medicine

## 2023-12-05 ENCOUNTER — Encounter: Payer: Self-pay | Admitting: Family Medicine

## 2024-03-06 ENCOUNTER — Ambulatory Visit (INDEPENDENT_AMBULATORY_CARE_PROVIDER_SITE_OTHER): Payer: Medicare Other | Admitting: Family Medicine

## 2024-03-06 VITALS — BP 110/60 | HR 66 | Ht 68.0 in | Wt 176.1 lb

## 2024-03-06 DIAGNOSIS — N1831 Chronic kidney disease, stage 3a: Secondary | ICD-10-CM | POA: Diagnosis not present

## 2024-03-06 DIAGNOSIS — E162 Hypoglycemia, unspecified: Secondary | ICD-10-CM | POA: Diagnosis not present

## 2024-03-06 DIAGNOSIS — Z8673 Personal history of transient ischemic attack (TIA), and cerebral infarction without residual deficits: Secondary | ICD-10-CM | POA: Diagnosis not present

## 2024-03-06 DIAGNOSIS — N401 Enlarged prostate with lower urinary tract symptoms: Secondary | ICD-10-CM

## 2024-03-06 DIAGNOSIS — G301 Alzheimer's disease with late onset: Secondary | ICD-10-CM | POA: Diagnosis not present

## 2024-03-06 DIAGNOSIS — K2100 Gastro-esophageal reflux disease with esophagitis, without bleeding: Secondary | ICD-10-CM | POA: Diagnosis not present

## 2024-03-06 DIAGNOSIS — E785 Hyperlipidemia, unspecified: Secondary | ICD-10-CM | POA: Insufficient documentation

## 2024-03-06 DIAGNOSIS — N261 Atrophy of kidney (terminal): Secondary | ICD-10-CM

## 2024-03-06 DIAGNOSIS — F02B4 Dementia in other diseases classified elsewhere, moderate, with anxiety: Secondary | ICD-10-CM

## 2024-03-06 DIAGNOSIS — M1A09X0 Idiopathic chronic gout, multiple sites, without tophus (tophi): Secondary | ICD-10-CM | POA: Diagnosis not present

## 2024-03-06 DIAGNOSIS — F3170 Bipolar disorder, currently in remission, most recent episode unspecified: Secondary | ICD-10-CM

## 2024-03-06 MED ORDER — PANTOPRAZOLE SODIUM 20 MG PO TBEC: 20.0000 mg | DELAYED_RELEASE_TABLET | Freq: Every day | ORAL | 3 refills | Status: AC

## 2024-03-06 NOTE — Assessment & Plan Note (Signed)
 No recent flares.  Will check uric acid level.

## 2024-03-06 NOTE — Assessment & Plan Note (Addendum)
 Stable on Lexapro , Lamictal and a low-dose quetiapine.  No longer following with psychiatry and they will write for these medications.  Will provide letter saying he no longer needs the benzodiazepine which she was just using occasionally

## 2024-03-06 NOTE — Assessment & Plan Note (Signed)
Need to monitor renal function every 6 months.

## 2024-03-06 NOTE — Patient Instructions (Addendum)
 Please see if living facility could update his Tdap.  Decrease Protonix  to 20 mg QD, from 40 mg.

## 2024-03-06 NOTE — Assessment & Plan Note (Signed)
 Known renal atrophy.  Due for urine microalbumin.

## 2024-03-06 NOTE — Assessment & Plan Note (Signed)
 Oertli on Protonix  40 mg for GERD.  I would like to see if we might be able to drop him down to 20 mg dose.

## 2024-03-06 NOTE — Progress Notes (Signed)
 Established Patient Office Visit  Subjective  Patient ID: Andres Stevens, male    DOB: 12/08/1945  Age: 78 y.o. MRN: [id number]  Chief Complaint  Patient presents with   Medical Management of Chronic Issues    Pt will need a letter since he is no longer taking Lorazepam  0.5 mg.   Also would like to have R great toenail checked this was removed by Rodger Civil 2/18    HPI  Check right great toenail, removed in January. Having some pain at the toe joint..  He has been having some pain more at the base of the toe but not along the nail border.  Nails have not been clipped in a little while.  He did have a prescription for lorazepam  to use as needed if he started feeling anxious or having major behavioral issues with related to his dementia.  He has not used it in a long time and we have removed it from the medication list but we need a order for the nursing home to state that it has been completely removed.  He gets chronic lower extremity edema but it is not too bad today.  It has been fairly stable.  As far as his wife knows there have been no major shift or changes in his mood he has been fairly stable.     ROS    Objective:     BP 110/60   Pulse 66   Ht 5' 8 (1.727 m)   Wt 176 lb 1.9 oz (79.9 kg)   SpO2 98%   BMI 26.78 kg/m    Physical Exam   No results found for any visits on 03/06/24.    The ASCVD Risk score (Arnett DK, et al., 2019) failed to calculate for the following reasons:   The 2019 ASCVD risk score is only valid for ages 66 to 56    Assessment & Plan:   Problem List Items Addressed This Visit       Digestive   GERD (gastroesophageal reflux disease)   Oertli on Protonix  40 mg for GERD.  I would like to see if we might be able to drop him down to 20 mg dose.      Relevant Medications   pantoprazole  (PROTONIX ) 20 MG tablet   Other Relevant Orders   Uric acid   Urine Microalbumin w/creat. ratio   CMP14+EGFR   Lipid panel     Endocrine    Hypoglycemia   Follows with Dr. Hershal Loron, endocrinology.  Currently on diazoxide         Nervous and Auditory   Moderate dementia with anxiety (HCC) - Primary   Currently on Namenda .  Follows with [facility] Clearance Cure at [facility].  Currently living in a nursing home for 24-hour care but not currently in the memory unit.        Genitourinary   Renal atrophy   Need to monitor renal function every 6 months.      Relevant Orders   Lipid panel   CKD (chronic kidney disease) stage 3, GFR 30-59 ml/min (HCC)   Known renal atrophy.  Due for urine microalbumin.      Relevant Orders   Uric acid   Urine Microalbumin w/creat. ratio   CMP14+EGFR   Lipid panel   BPH (benign prostatic hyperplasia)   Relevant Orders   Uric acid   Urine Microalbumin w/creat. ratio   CMP14+EGFR   Lipid panel     Other   Hyperlipidemia   To recheck lipid panel.  Relevant Orders   Lipid panel   History of stroke   Relevant Orders   Lipid panel   Gout   No recent flares.  Will check uric acid level.      Relevant Orders   Uric acid   Urine Microalbumin w/creat. ratio   CMP14+EGFR   Lipid panel   Bipolar disorder (HCC)   Stable on Lexapro , Lamictal and a low-dose quetiapine.  No longer following with psychiatry and they will write for these medications.  Will provide letter saying he no longer needs the benzodiazepine which she was just using occasionally       Return in about 6 months (around 09/05/2024) for recheck kidneys, etc .    Duaine German, MD

## 2024-03-06 NOTE — Assessment & Plan Note (Signed)
 Follows with Dr. Hershal Loron, endocrinology.  Currently on diazoxide 

## 2024-03-06 NOTE — Assessment & Plan Note (Signed)
 Currently on Namenda .  Follows with Dr. Clearance Cure at [facility].  Currently living in a nursing home for 24-hour care but not currently in the memory unit.

## 2024-03-06 NOTE — Assessment & Plan Note (Signed)
To recheck lipid panel.

## 2024-03-07 ENCOUNTER — Ambulatory Visit: Payer: Self-pay | Admitting: Family Medicine

## 2024-03-07 LAB — CMP14+EGFR
ALT: 19 IU/L (ref 0–44)
AST: 25 IU/L (ref 0–40)
Albumin: 4.4 g/dL (ref 3.8–4.8)
Alkaline Phosphatase: 106 IU/L (ref 44–121)
BUN/Creatinine Ratio: 14 (ref 10–24)
BUN: 15 mg/dL (ref 8–27)
Bilirubin Total: 0.6 mg/dL (ref 0.0–1.2)
CO2: 22 mmol/L (ref 20–29)
Calcium: 9.1 mg/dL (ref 8.6–10.2)
Chloride: 104 mmol/L (ref 96–106)
Creatinine, Ser: 1.08 mg/dL (ref 0.76–1.27)
Globulin, Total: 1.9 g/dL (ref 1.5–4.5)
Glucose: 93 mg/dL (ref 70–99)
Potassium: 3.7 mmol/L (ref 3.5–5.2)
Sodium: 143 mmol/L (ref 134–144)
Total Protein: 6.3 g/dL (ref 6.0–8.5)
eGFR: 69 mL/min/{1.73_m2} (ref >59)

## 2024-03-07 LAB — LIPID PANEL
Chol/HDL Ratio: 3.5 ratio (ref 0.0–5.0)
Cholesterol, Total: 118 mg/dL (ref 100–199)
HDL: 34 mg/dL — ABNORMAL LOW (ref >39)
LDL Chol Calc (NIH): 45 mg/dL (ref 0–99)
Triglycerides: 250 mg/dL — ABNORMAL HIGH (ref 0–149)
VLDL Cholesterol Cal: 39 mg/dL (ref 5–40)

## 2024-03-07 LAB — MICROALBUMIN / CREATININE URINE RATIO
Creatinine, Urine: 201.9 mg/dL
Microalb/Creat Ratio: 3 mg/g{creat} (ref 0–29)
Microalbumin, Urine: 7 ug/mL

## 2024-03-07 LAB — URIC ACID: Uric Acid: 4.4 mg/dL (ref 3.8–8.4)

## 2024-03-07 NOTE — Progress Notes (Signed)
No excess protein in the urine which is great.

## 2024-03-07 NOTE — Progress Notes (Signed)
 Please call Andres Stevens's wife with lab results.  LDL cholesterol looks great.  Triglycerides are up a little bit but slightly better compared to last year.  Uric acid looks fantastic so we really should not be having any gout flares or if he does that should be rare.  Metabolic panel including liver and kidney look great no sign of inflammation or abnormalities there.  Urine test still pending for excess protein.

## 2024-03-11 ENCOUNTER — Other Ambulatory Visit: Payer: Self-pay | Admitting: Family Medicine

## 2024-04-15 ENCOUNTER — Encounter: Payer: Self-pay | Admitting: Family Medicine

## 2024-04-16 MED ORDER — CHOLECALCIFEROL 125 MCG (5000 UT) PO CAPS: 5000.0000 [IU] | ORAL_CAPSULE | Freq: Every day | ORAL | Status: AC

## 2024-04-16 NOTE — Telephone Encounter (Signed)
 Form completed and signed and placed in Andres Stevens

## 2024-04-24 ENCOUNTER — Telehealth: Payer: Self-pay | Admitting: Family Medicine

## 2024-04-24 NOTE — Telephone Encounter (Signed)
 Copied from CRM [id number]. Topic: General - Other >> Apr 24, 2024  8:22 AM Zane F wrote: Reason for CRM:   Caller: [employer] Clinical research associate)  Calling From: [facility]  Calling to inform Dr. Alvan and her CMA that they have not received the care plan requested via MyChart for the patient listed. Please resubmit the fax to the fax number listed below. To confirm the fax was resubmitted please call Suzen at [phone].   If you have any further questions regarding the matter please contact Suzen at [phone].    Fax Number: [phone]

## 2024-05-08 ENCOUNTER — Other Ambulatory Visit: Payer: Self-pay | Admitting: Family Medicine

## 2024-05-27 ENCOUNTER — Other Ambulatory Visit: Payer: Self-pay | Admitting: Internal Medicine

## 2024-05-27 ENCOUNTER — Encounter: Payer: Self-pay | Admitting: Sports Medicine

## 2024-06-09 DIAGNOSIS — D485 Neoplasm of uncertain behavior of skin: Secondary | ICD-10-CM | POA: Diagnosis not present

## 2024-06-10 NOTE — Progress Notes (Signed)
 Memory Assessment Clinic Follow-Up  Subjective:    Patient ID: Andres Stevens is a 78 y.o. male with Vascular Dementia who is here for follow-up of same.  Initial Evaluation on 05/26/21 Concluded: Vascular dementia with behavior disturbance (HCC)-Andres Stevens has memory loss that followed his stroke and progressive functional decline consistent with Vascular dementia. Would like to see him on namenda , but at a later date.  Switched Olanzapine  to Seroquel, started Lexapro  MMSE 11/30   11/22/21 Requiring more assistance with ADLs. Behaviors well controlled on Namenda  and Seroquel   05/24/23 Continued progressive decline. Patient now having trouble with long term memory.  Now requiring briefs for incontinence of bowels and bladder. Behaviors and anxiety have been well controlled on current medications - Lexapro  20 mg, Quetiapine 25 mg nightly.  He is tolerating Namenda  5 mg BID well. Recommend increasing to 10 mg BID --- Continue Seroquel and Lexapro    11/28/22 Moderate vascular dementia with anxiety (HCC) Continued progressive decline. Behaviors have been well controlled on Lexapro  20 mg and Seroquel 25 mg.  Consider reducing Seroquel dose or changing to PRN at next visit. Continue Lexapro , Namenda , and Seroquel.  06/12/23 Moderately Severe vascular dementia with anxiety [facility]) Initial MMSE in 9/22 was 11/30. Mild progressive decline. FAST 6d Behaviors have been well controlled on Lexapro  20 mg and Seroquel 25 mg for some time.             Recommend changing Seroquel 25 mg to nightly PRN Continue Lexapro , Namenda ,    HPI today: History of Present Illness The patient is an 78 year old male who presents for follow-up of his vascular dementia. He is accompanied by his wife.  He reports feeling well overall, with intermittent memory lapses. His wife has observed that he often struggles to recall recent events or conversations, such as those involving their grandsons. He has been residing  in an assisted living facility for the past 3 years, where he frequently expresses concern for the other residents. His appetite remains good, although he has developed a dislike for finger foods due to the messiness. He reports no sleep disturbances. His mood is generally positive, and he does not feel depressed. He participates in activities at his residence. His wife notes that he laughs more frequently now.   His wife is considering bringing him home next year due to finances and may seek an appointment before doing so. He is currently taking memantine  10 mg twice daily, Lorazepam  0.5 mg daily as needed, and Seroquel 25 mg nightly.  Koren remains on Seroquel 25 mg at night. She spoke with the staff at his facility and they do not feel this should be moved to PRN.   The following portions of the patient's history were reviewed and updated as appropriate: allergies, current medications, past family history, past medical history, past social history, past surgical history and problem list.  Functional Status: Instrumental Activities of Daily Living (IADL): Telephone: Dependent Travel: Dependent Shopping: Dependent Meal Preparation: Dependent Housework/Hobbies: Dependent Medication Management: Assistance required Finances: Dependent    Activities of Daily Living (ADL):  Bathing: Dependent Dressing: Dependent Toileting: Dependent Transfer: Independent Feeding: Independent  Caregiver reported scales: Neuropsychiatric Symptoms (NPI-Q): Mr. Counsell's primary caregiver reports Mr. Cairns has mild depression/dysphoria, elation/euphoria, apathy/indifference, and appetite/eating behaviors. The caregiver's distress level due to these symptoms is generally mild.   Caregiver Burden: was assessed with the [place] Caregiver Burden Index.  The patient's care-partner scores 12, consistent with 0-10: no to mild burden   Review of Systems ROS: A  complete review of systems was performed. Pertinent ROS  to cognition are listed in HPI. All other systems were negative for recent changes. Denies any urinary or bowel changes, mood has been stable, with some memory impairment, no falls or complaints of pain.   Data Reviewed: Previous [facility] notes Initial MMSE in 9/22 was 11/30.    Objective:   PHYSICAL EXAM BP 106/67   Pulse 83   Temp 97.8 F (36.6 C) (Temporal)   Wt 81.4 kg (179 lb 6.4 oz)   SpO2 97%  General: Alert to self and guardian  and In no acute distress Appearance: normal body habitus, appears stated age, and hygiene appropriate Eyes: sclera and conjunctiva are clear Ears: external inspections normal  Oropharynx: dentition appropriate for age, oral mucosa moist and post-pharynx clear  Cardiovascular: rate normal Respiratory: normal effort  Neuro: Gait: normal  and slow Psychiatric:  Attitude/behavior: cooperative, pleasant, and appropriate eye contact Mood: Normal Cognition: memory impaired and language impaired     Assessment/Plan:   Andres Stevens is a 78 y.o. y.o. male with Vascular Dementia who is here for follow-up of Assessment & Plan 1. Vascular Dementia: - Condition remains stable with no significant changes since the last visit. Memory comes and goes. Short-term memory is still a challenge, as he could not recall what he had for lunch. Residing in an assisted living facility for three years. Appetite and sleep are good, and no complaints about mood. -Behaviors have been well controlled on Lexapro  20 mg and Seroquel 25 mg  - Continue Memantine  10 mg twice a day for memory. - Continue Lexapro , Memantine  and Serquel - Possibility of transitioning to palliative care was discussed, which would involve monthly check-ins. Wife was advised to contact the office if she decides to bring him home before the next scheduled appointment.   Follow-up: The patient will follow up in 1 year.    This is a shared note. I support your right to access your health information in an  open, easy manner. We are partners together to improve your health. If you are a patient or caregiver with concerns about this note please either send a MyChart message to myself or give us  a call in the geriatric clinic business hours at [phone] or talk to us  at your next visit.   Greater than 50% of the 33 minute visit was spent in counseling and/or coordinating care of the patient regarding diagnosis, disease prognosis, and management strategies for dementia related behaviors  I spent an additional 14 min in documentation and review of records for this patient on 06/10/24.

## 2024-07-29 ENCOUNTER — Encounter: Payer: Self-pay | Admitting: Internal Medicine

## 2024-07-29 ENCOUNTER — Ambulatory Visit (INDEPENDENT_AMBULATORY_CARE_PROVIDER_SITE_OTHER): Admitting: Internal Medicine

## 2024-07-29 VITALS — BP 158/2 | Ht 68.0 in | Wt 176.0 lb

## 2024-07-29 DIAGNOSIS — E162 Hypoglycemia, unspecified: Secondary | ICD-10-CM

## 2024-07-29 LAB — POCT GLYCOSYLATED HEMOGLOBIN (HGB A1C): Hemoglobin A1C: 5.1 % (ref 4.0–5.6)

## 2024-07-29 MED ORDER — DIAZOXIDE 50 MG/ML PO SUSP: 100.0000 mg | Freq: Two times a day (BID) | ORAL | 3 refills | Status: AC

## 2024-07-29 NOTE — Progress Notes (Signed)
 Name: Andres Stevens  MRN/ DOB: [id number], 1945-11-02    Age/ Sex: 78 y.o., male     PCP: Alvan Dorothyann BIRCH, MD   Reason for Endocrinology Evaluation: Elevated Catecholamine     Initial Endocrinology Clinic Visit: 08/30/2020    PATIENT IDENTIFIER: Andres Stevens is a 78 y.o., male with a past medical history of Dyslipidemia, bipolar disorder and history of hemorrhagic stroke, S/P parathyroidectomy and vascular dementia . He has followed with [facility] since 08/30/2020 for consultative assistance with management of his elevated catecholamine.      HISTORICAL SUMMARY:  During evaluation for elevated BP readings at home in 07/2120 he was noted to have elevated plasma norepinephrine at 1973 pg/mL, and normetanephrine at 513 pg/ml but with normal epinephrine and metanephrines. His abdominal CT on 08/09/2020 was unrevealing.    He has been on doxazosin   since hemorrhagic stroke ~ 2016 . IN the past few weeks wife noted labile BP's. With SBP as high as 200 mg/dL mainly during the evening and lower in the morning. His Doxazosin  dose has been increased to 2 mg and BP has been more manageable .      HYPOGLYCEMIA HISTORY: Pt was admitted to [facility] on 08/31/2020 with a BG of 33 mg/dL during evaluation for altered mental status as well as hypotension. He was treated with IV dextrose through EMS BY his errival to the ED his Bg was 57 mg/dL.   He was treated medically for suspected insulinoma with diazoxide  09/04/2020 . CT pancreatic protocol negative for pancreatic mass . Of note the pt was also diagnosed with Enterococcus UTI and treated with Vancomycin   No biological children  SUBJECTIVE:    Today (07/29/2024):  Andres Stevens is here for a follow up on hypoglycemia . He resides at assisted living, they continue to check glucose occasionally.  No hypoglycemia .  Lowest BG 120 mg/DL  He is accompanied by his wife today   He follows with Geriatrics for  vascular dementia  Weight remains stable No hyperhidrosis  No palpitations  Unclear of changes in bowel movements , he wears depends      ENDOCRINE MEDICATIONS Diazoxide  100 mg BID      HISTORY:  Past Medical History:  Past Medical History:  Diagnosis Date   Bipolar 1 disorder (HCC)    Wears hearing aid    Past Surgical History:  Past Surgical History:  Procedure Laterality Date   CERVICAL FUSION     PARATHYROIDECTOMY  03/2016   Social History:  reports that he quit smoking about 9 years ago. His smoking use included cigarettes. He has never used smokeless tobacco. He reports current alcohol  use. He reports that he does not use drugs. Family History:  Family History  Problem Relation Age of Onset   Depression Father      HOME MEDICATIONS: Allergies as of 07/29/2024       Reactions   Bee Venom Anaphylaxis   Hx of local rxn        Medication List        Accurate as of July 29, 2024 12:05 PM. If you have any questions, ask your nurse or doctor.          acetaminophen  650 MG CR tablet Commonly known as: TYLENOL  TAKE 2 TABLETS BY MOUTH EVERY 8 HOURS AS NEEDED FOR PAIN.   Alcohol  Swabs 70 % Pads USE AS DIRECTED TO CHECK BLOOD SUGARS.   allopurinol  300 MG tablet Commonly known as: ZYLOPRIM  TAKE (1)  TABLET BY MOUTH ONCE DAILY.   AMBULATORY NON FORMULARY MEDICATION Medication Name: Mylanta  - 15 ml po BID prn for reflux or heart burn.   atorvastatin  40 MG tablet Commonly known as: LIPITOR TAKE (1) TABLET BY MOUTH AT BEDTIME.   B Complex-B12 Tabs Take 1 tablet by mouth daily.   Cholecalciferol  125 MCG (5000 UT) capsule Take 1 capsule (5,000 Units total) by mouth daily.   diazoxide  50 MG/ML suspension Commonly known as: PROGLYCEM  Take 2 mLs (100 mg total) by mouth in the morning and at bedtime.   diclofenac  Sodium 1 % Gel Commonly known as: VOLTAREN  Apply 4 g topically 4 (four) times daily. To affected joint.   doxazosin  1 MG  tablet Commonly known as: CARDURA  TAKE (1) TABLET BY MOUTH AT BEDTIME.   doxazosin  2 MG tablet Commonly known as: CARDURA  TAKE 1/2 TABLET (1MG ) BY MOUTH DAILY AS NEEDED FOR SYSTOLIC BLOOD PRESSURE GREATER THAN 145.   EasyMax Test test strip Generic drug: glucose blood TEST BLOOD SUGAR ONCE DAILY.   escitalopram  20 MG tablet Commonly known as: LEXAPRO  TAKE (1) TABLET BY MOUTH ONCE DAILY.   lamoTRIgine 100 MG tablet Commonly known as: LAMICTAL TAKE (1) TABLET BY MOUTH AT BEDTIME.   memantine  10 MG tablet Commonly known as: NAMENDA  Take 1 tablet (10 mg total) by mouth in the morning and at bedtime.   pantoprazole  20 MG tablet Commonly known as: PROTONIX  Take 1 tablet (20 mg total) by mouth daily.   QUEtiapine 25 MG tablet Commonly known as: SEROQUEL Take by mouth.   ReliOn Lancet Devices 30G Misc 1 Device by Does not apply route daily.          OBJECTIVE:   PHYSICAL EXAM: VS: BP (!) 158/2   Ht 5' 8 (1.727 m)   Wt 176 lb (79.8 kg)   BMI 26.76 kg/m    EXAM: General: Pt appears well and is in NAD  Lungs: Clear with good BS bilat   Heart: Auscultation: RRR.  Extremities:  BL LE: no  pretibial edema   Mental Status:  Mood and affect: No depression, anxiety, or agitation     DATA REVIEWED:     Latest Reference Range & Units 03/06/24 11:52  Sodium 134 - 144 mmol/L 143  Potassium 3.5 - 5.2 mmol/L 3.7  Chloride 96 - 106 mmol/L 104  CO2 20 - 29 mmol/L 22  Glucose 70 - 99 mg/dL 93  BUN 8 - 27 mg/dL 15  Creatinine 9.23 - 8.72 mg/dL 8.91  Calcium  8.6 - 10.2 mg/dL 9.1  BUN/Creatinine Ratio 10 - 24  14  eGFR >59 mL/min/1.73 69  Alkaline Phosphatase 44 - 121 IU/L 106  Albumin 3.8 - 4.8 g/dL 4.4  Uric Acid 3.8 - 8.4 mg/dL 4.4  AST 0 - 40 IU/L 25  ALT 0 - 44 IU/L 19  Total Protein 6.0 - 8.5 g/dL 6.3  Total Bilirubin 0.0 - 1.2 mg/dL 0.6      87/09/7976   6:41 AM  Insulin 2.7 uU/mL  Proinsulin 8.7 uU/mL    No concomitante serum glucose   But has  a finger stick of 100 at 2:30 Am      CT abdomen /Pelvis 09/09/2020  CT ABDOMEN: Normal pancreas. No evidence for pancreatic tumor. Lung bases demonstrate a small right pleural effusion with dependent right lower lobe subsegmental atelectasis. Thick-walled esophagus suggesting esophagitis. Normal heart size. Normal liver, gallbladder, biliary tree, pancreas, spleen, and adrenal glands. Small kidneys with bilateral renal cortical cysts. Aortoiliac  atherosclerosis without aneurysm. No ascites or adenopathy.   CT PELVIS: Fat-containing bilateral inguinal hernias. Normal bladder, prostate, and seminal vesicles. Nonspecific gaseous distention of stomach, small bowel, and colon. No pelvic mass, adenopathy, or fluid collection. Multilevel lumbar DDD.    IMPRESSION: No evidence for pancreatic tumor.        ASSESSMENT / PLAN / RECOMMENDATIONS:   1. Hypoglycemia :   - High clinical suspicion for insulinoma , unfortunately no biochemical tests to confirm this it during hospitalizations (08/2020) and trying to confirm this bio chemically will cause hardship on the patient due to  dementia.  -We have opted to treat him medically - Negative CT imaging does NOT exclude insulinoma as they tend to be small and require endoscopic ultrasound  - Per spouse, her pharmacy has only been dispensing 2 bottles at a time which only covers half of the month.  I am not sure why she is only being dispensed 2 bottles at this time as the prescription is written for a 90-day supply which is equivalent to 12 bottles.  A printed prescription was provided to the spouse to take to the pharmacy and verify prescription - No changes   Medication Continue Diazoxide  100 mg BID     F/U in 1 year   Signed electronically by: Stefano Redgie Butts, MD  [facility]  [facility] [address] [place], [place] [address] Phone: [phone] FAX: [phone]      CC: Alvan Dorothyann BIRCH, MD [address] [facility] [address] [place] [address] Phone: [phone]  Fax: [phone]   Return to Endocrinology clinic as below: Future Appointments  Date Time Provider Department Center  09/03/2024 11:10 AM Alvan Dorothyann BIRCH, MD [facility]

## 2024-07-29 NOTE — Patient Instructions (Addendum)
-   Diazoxide  100 mg ( 2 mL ) TWICE daily    Patient has insulinoma, which is an insulin producing tumor and will require this medication for life to prevent fatal hypoglycemic episodes

## 2024-08-05 ENCOUNTER — Other Ambulatory Visit: Payer: Self-pay | Admitting: Family Medicine

## 2024-08-05 DIAGNOSIS — M7989 Other specified soft tissue disorders: Secondary | ICD-10-CM

## 2024-08-07 ENCOUNTER — Ambulatory Visit: Payer: Medicare Other | Admitting: Internal Medicine

## 2024-08-28 ENCOUNTER — Encounter: Payer: Self-pay | Admitting: Family Medicine

## 2024-08-28 MED ORDER — LORAZEPAM 0.5 MG PO TABS: ORAL_TABLET | ORAL | 0 refills | Status: DC

## 2024-08-28 NOTE — Telephone Encounter (Signed)
 Requesting rx rf of Lorazepam   Please see patient message attached. Not showing in current med list  (Pended rx from past med list)  Shows last written as 0.5mg  on 08/22/2023 Last OV 03/06/2024 Upcoming appt 09/03/2024

## 2024-08-29 NOTE — Telephone Encounter (Signed)
 Thsi was sent yesterday... was it wrong pharmacy?

## 2024-08-29 NOTE — Addendum Note (Signed)
 Addended byBETHA DUWAINE RIGGS on: 08/29/2024 08:39 AM   Modules accepted: Orders

## 2024-09-01 MED ORDER — LORAZEPAM 0.5 MG PO TABS: ORAL_TABLET | ORAL | 0 refills | Status: AC

## 2024-09-03 ENCOUNTER — Encounter: Payer: Self-pay | Admitting: Family Medicine

## 2024-09-03 ENCOUNTER — Ambulatory Visit: Admitting: Family Medicine

## 2024-09-03 DIAGNOSIS — M25471 Effusion, right ankle: Secondary | ICD-10-CM | POA: Diagnosis not present

## 2024-09-03 DIAGNOSIS — M25475 Effusion, left foot: Secondary | ICD-10-CM | POA: Diagnosis not present

## 2024-09-03 DIAGNOSIS — M25472 Effusion, left ankle: Secondary | ICD-10-CM | POA: Diagnosis not present

## 2024-09-03 DIAGNOSIS — Z23 Encounter for immunization: Secondary | ICD-10-CM | POA: Diagnosis not present

## 2024-09-03 DIAGNOSIS — N1831 Chronic kidney disease, stage 3a: Secondary | ICD-10-CM

## 2024-09-03 DIAGNOSIS — G301 Alzheimer's disease with late onset: Secondary | ICD-10-CM | POA: Diagnosis not present

## 2024-09-03 DIAGNOSIS — F3170 Bipolar disorder, currently in remission, most recent episode unspecified: Secondary | ICD-10-CM

## 2024-09-03 DIAGNOSIS — M25474 Effusion, right foot: Secondary | ICD-10-CM | POA: Diagnosis not present

## 2024-09-03 DIAGNOSIS — F02B4 Dementia in other diseases classified elsewhere, moderate, with anxiety: Secondary | ICD-10-CM | POA: Diagnosis not present

## 2024-09-03 DIAGNOSIS — M1A09X0 Idiopathic chronic gout, multiple sites, without tophus (tophi): Secondary | ICD-10-CM

## 2024-09-03 NOTE — Assessment & Plan Note (Signed)
 Hx fo renal atrophy. Continue to monitor Q 6 mo Avoid NSAIDS.

## 2024-09-03 NOTE — Progress Notes (Signed)
 Established Patient Office Visit  Patient ID: Andres Stevens, male    DOB: [birth year]  Age: 78 y.o. MRN: [id number] PCP: Alvan Dorothyann BIRCH, MD  Chief Complaint  Patient presents with   Chronic Kidney Disease    Subjective:     HPI  Brought in today by his wife.  Feels he is doing well. Has noticed he is more irritable and disagreeable with staff at the nursing home mostly in the evenings.   Still reports occ knee pain. Still some swelling in the ankles but better than it used to be.  Having issues getting 4 bottles per month of the of the diazoxide  liquid.  Pharmacy on dispensing 2 bottles at a time.  Costing about $100/mo.   Wife worried in about a year will run out of money for him to be at [facility] and may have to move back into home for full care.     ROS    Objective:     BP 135/60   Pulse 68   Ht 5' 8 (1.727 m)   Wt 180 lb 0.6 oz (81.7 kg)   SpO2 99%   BMI 27.37 kg/m    Physical Exam Vitals and nursing note reviewed.  Constitutional:      Appearance: Normal appearance.  HENT:     Head: Normocephalic and atraumatic.  Eyes:     Conjunctiva/sclera: Conjunctivae normal.  Cardiovascular:     Rate and Rhythm: Normal rate and regular rhythm.  Pulmonary:     Effort: Pulmonary effort is normal.     Breath sounds: Normal breath sounds.  Musculoskeletal:     Comments: Trace edema on left ankle  Skin:    General: Skin is warm and dry.  Neurological:     Mental Status: He is alert.  Psychiatric:        Mood and Affect: Mood normal.      No results found for any visits on 09/03/24.    The ASCVD Risk score (Arnett DK, et al., 2019) failed to calculate for the following reasons:   The 2019 ASCVD risk score is only valid for ages 52 to 31    Assessment & Plan:   Problem List Items Addressed This Visit       Nervous and Auditory   Moderate dementia with anxiety (HCC)   Stable on current regimne. No recent changes to pysch meds.  See Geri clinic  at [facility] now yearly.          Genitourinary   CKD (chronic kidney disease) stage 3, GFR 30-59 ml/min (HCC) - Primary   Hx fo renal atrophy. Continue to monitor Q 6 mo Avoid NSAIDS.       Relevant Orders   CBC with Differential/Platelet   CMP14+EGFR     Other   Gout   No recetn flares in feet or ankles.  Some pain from OA in these areas.       Bipolar disorder (HCC)   No recent change to psyc meds. Added back benzo prn at night because of increased agitation at night.       Bilateral swelling of feet and ankles   Trace swelling on left ankle.  Better than was       Other Visit Diagnoses       Encounter for immunization       Relevant Orders   Flu vaccine HIGH DOSE PF(Fluzone Trivalent) (Completed)       Assessment and Plan Assessment & Plan  Return in about 6 months (around 03/04/2025) for CKD.    Dorothyann Byars, MD [facility] at [facility]

## 2024-09-03 NOTE — Assessment & Plan Note (Signed)
 No recent change to psyc meds. Added back benzo prn at night because of increased agitation at night.

## 2024-09-03 NOTE — Assessment & Plan Note (Signed)
 No recetn flares in feet or ankles.  Some pain from OA in these areas.

## 2024-09-03 NOTE — Assessment & Plan Note (Signed)
 Trace swelling on left ankle.  Better than was

## 2024-09-03 NOTE — Assessment & Plan Note (Signed)
 Stable on current regimne. No recent changes to pysch meds.  See Geri clinic at [facility] now yearly.

## 2024-09-04 ENCOUNTER — Ambulatory Visit: Payer: Self-pay | Admitting: Family Medicine

## 2024-09-04 LAB — CMP14+EGFR
ALT: 30 IU/L (ref 0–44)
AST: 25 IU/L (ref 0–40)
Albumin: 4.6 g/dL (ref 3.8–4.8)
Alkaline Phosphatase: 103 IU/L (ref 47–123)
BUN/Creatinine Ratio: 16 (ref 10–24)
BUN: 18 mg/dL (ref 8–27)
Bilirubin Total: 0.7 mg/dL (ref 0.0–1.2)
CO2: 25 mmol/L (ref 20–29)
Calcium: 9.3 mg/dL (ref 8.6–10.2)
Chloride: 100 mmol/L (ref 96–106)
Creatinine, Ser: 1.12 mg/dL (ref 0.76–1.27)
Globulin, Total: 2 g/dL (ref 1.5–4.5)
Glucose: 112 mg/dL — ABNORMAL HIGH (ref 70–99)
Potassium: 4 mmol/L (ref 3.5–5.2)
Sodium: 137 mmol/L (ref 134–144)
Total Protein: 6.6 g/dL (ref 6.0–8.5)
eGFR: 66 mL/min/1.73 (ref >59)

## 2024-09-04 LAB — CBC WITH DIFFERENTIAL/PLATELET
Basophils Absolute: 0 x10E3/uL (ref 0.0–0.2)
Basos: 0 %
EOS (ABSOLUTE): 0.2 x10E3/uL (ref 0.0–0.4)
Eos: 3 %
Hematocrit: 36.9 % — ABNORMAL LOW (ref 37.5–51.0)
Hemoglobin: 12.8 g/dL — ABNORMAL LOW (ref 13.0–17.7)
Immature Grans (Abs): 0 x10E3/uL (ref 0.0–0.1)
Immature Granulocytes: 0 %
Lymphocytes Absolute: 1.3 x10E3/uL (ref 0.7–3.1)
Lymphs: 23 %
MCH: 31.5 pg (ref 26.6–33.0)
MCHC: 34.7 g/dL (ref 31.5–35.7)
MCV: 91 fL (ref 79–97)
Monocytes Absolute: 0.4 x10E3/uL (ref 0.1–0.9)
Monocytes: 8 %
Neutrophils Absolute: 3.7 x10E3/uL (ref 1.4–7.0)
Neutrophils: 65 %
Platelets: 149 x10E3/uL — ABNORMAL LOW (ref 150–450)
RBC: 4.06 x10E6/uL — ABNORMAL LOW (ref 4.14–5.80)
RDW: 14.3 % (ref 11.6–15.4)
WBC: 5.6 x10E3/uL (ref 3.4–10.8)

## 2024-09-04 NOTE — Progress Notes (Signed)
 Hemoglobin stable at 12.8 that is where he has been running for about the last 5 years.  Liver function looks great.  Kidney function is stable around 1.1.

## 2024-09-08 ENCOUNTER — Other Ambulatory Visit: Payer: Self-pay | Admitting: Family Medicine

## 2024-09-22 ENCOUNTER — Other Ambulatory Visit: Payer: Self-pay | Admitting: Internal Medicine

## 2024-09-23 ENCOUNTER — Other Ambulatory Visit: Payer: Self-pay | Admitting: Family Medicine

## 2024-09-23 DIAGNOSIS — M170 Bilateral primary osteoarthritis of knee: Secondary | ICD-10-CM

## 2024-09-23 MED ORDER — DICLOFENAC SODIUM 1 % EX GEL: 4.0000 g | Freq: Four times a day (QID) | CUTANEOUS | 11 refills | Status: AC

## 2024-09-23 NOTE — Telephone Encounter (Signed)
 Copied from CRM #[id number]. Topic: Clinical - Medication Refill >> Sep 23, 2024 11:01 AM Joesph B wrote: Medication: diclofenac  Sodium (VOLTAREN ) 1 % GEL   Has the patient contacted their pharmacy? Yes (Agent: If no, request that the patient contact the pharmacy for the refill. If patient does not wish to contact the pharmacy document the reason why and proceed with request.) (Agent: If yes, when and what did the pharmacy advise?)  This is the patient's preferred pharmacy:  [facility], [place] - [address] [place] [address] Phone: [phone] Fax: [phone]  Is this the correct pharmacy for this prescription? Yes If no, delete pharmacy and type the correct one.   Has the prescription been filled recently? Yes  Is the patient out of the medication? Yes  Has the patient been seen for an appointment in the last year OR does the patient have an upcoming appointment? Yes  Can we respond through MyChart? Yes  Agent: Please be advised that Rx refills may take up to 3 business days. We ask that you follow-up with your pharmacy.

## 2024-10-03 ENCOUNTER — Other Ambulatory Visit: Payer: Self-pay | Admitting: Internal Medicine

## 2024-10-10 ENCOUNTER — Other Ambulatory Visit: Payer: Self-pay | Admitting: Family Medicine

## 2024-10-10 DIAGNOSIS — M7989 Other specified soft tissue disorders: Secondary | ICD-10-CM

## 2024-10-29 ENCOUNTER — Encounter: Payer: Self-pay | Admitting: Family Medicine

## 2024-10-29 DIAGNOSIS — M1711 Unilateral primary osteoarthritis, right knee: Secondary | ICD-10-CM

## 2024-10-29 NOTE — Telephone Encounter (Signed)
"  Order pended.  "

## 2024-10-30 MED ORDER — ACETAMINOPHEN ER 650 MG PO TBCR: 1300.0000 mg | EXTENDED_RELEASE_TABLET | Freq: Two times a day (BID) | ORAL | 3 refills | Status: AC

## 2025-03-05 ENCOUNTER — Ambulatory Visit: Admitting: Family Medicine

## 2025-07-28 ENCOUNTER — Ambulatory Visit: Admitting: Internal Medicine
